# Patient Record
Sex: Male | Born: 1935
Health system: Southern US, Community
[De-identification: ages and names within clinical notes are randomized; demographics above are authoritative.]

## PROBLEM LIST (undated history)

## (undated) DIAGNOSIS — I10 Essential (primary) hypertension: Secondary | ICD-10-CM

## (undated) DIAGNOSIS — I509 Heart failure, unspecified: Secondary | ICD-10-CM

## (undated) DIAGNOSIS — C61 Malignant neoplasm of prostate: Secondary | ICD-10-CM

## (undated) DIAGNOSIS — Z8739 Personal history of other diseases of the musculoskeletal system and connective tissue: Secondary | ICD-10-CM

## (undated) DIAGNOSIS — E785 Hyperlipidemia, unspecified: Secondary | ICD-10-CM

## (undated) DIAGNOSIS — R011 Cardiac murmur, unspecified: Secondary | ICD-10-CM

## (undated) HISTORY — DX: Hyperlipidemia, unspecified: E78.5

## (undated) HISTORY — DX: Essential (primary) hypertension: I10

## (undated) HISTORY — PX: CATARACT EXTRACTION W/ INTRAOCULAR LENS  IMPLANT, BILATERAL: SHX1307

---

## 1998-02-23 ENCOUNTER — Emergency Department (HOSPITAL_COMMUNITY): Admission: EM | Admit: 1998-02-23 | Discharge: 1998-02-23 | Payer: Self-pay | Admitting: Emergency Medicine

## 1998-04-03 ENCOUNTER — Other Ambulatory Visit: Admission: RE | Admit: 1998-04-03 | Discharge: 1998-04-03 | Payer: Self-pay | Admitting: Urology

## 1998-04-09 ENCOUNTER — Encounter: Admission: RE | Admit: 1998-04-09 | Discharge: 1998-07-08 | Payer: Self-pay | Admitting: Radiation Oncology

## 1999-03-11 ENCOUNTER — Encounter: Admission: RE | Admit: 1999-03-11 | Discharge: 1999-03-11 | Payer: Self-pay | Admitting: Internal Medicine

## 2002-02-21 ENCOUNTER — Emergency Department (HOSPITAL_COMMUNITY): Admission: EM | Admit: 2002-02-21 | Discharge: 2002-02-21 | Payer: Self-pay | Admitting: Emergency Medicine

## 2002-03-14 ENCOUNTER — Emergency Department (HOSPITAL_COMMUNITY): Admission: EM | Admit: 2002-03-14 | Discharge: 2002-03-14 | Payer: Self-pay | Admitting: *Deleted

## 2004-07-21 ENCOUNTER — Ambulatory Visit: Payer: Self-pay | Admitting: Nurse Practitioner

## 2004-09-29 ENCOUNTER — Ambulatory Visit: Payer: Self-pay | Admitting: Nurse Practitioner

## 2004-11-13 ENCOUNTER — Ambulatory Visit: Payer: Self-pay | Admitting: Nurse Practitioner

## 2005-04-24 ENCOUNTER — Ambulatory Visit: Payer: Self-pay | Admitting: Nurse Practitioner

## 2005-11-27 ENCOUNTER — Ambulatory Visit: Payer: Self-pay | Admitting: Nurse Practitioner

## 2005-12-31 ENCOUNTER — Ambulatory Visit: Payer: Self-pay | Admitting: Nurse Practitioner

## 2006-05-27 ENCOUNTER — Ambulatory Visit: Payer: Self-pay | Admitting: Nurse Practitioner

## 2006-11-29 ENCOUNTER — Ambulatory Visit: Payer: Self-pay | Admitting: Nurse Practitioner

## 2007-06-22 ENCOUNTER — Ambulatory Visit: Payer: Self-pay | Admitting: Internal Medicine

## 2007-07-15 ENCOUNTER — Ambulatory Visit: Payer: Self-pay | Admitting: Cardiology

## 2007-07-15 LAB — CONVERTED CEMR LAB
BUN: 16 mg/dL (ref 6–23)
Calcium: 9.3 mg/dL (ref 8.4–10.5)
Chloride: 101 meq/L (ref 96–112)
GFR calc non Af Amer: 48 mL/min
Glucose, Bld: 102 mg/dL — ABNORMAL HIGH (ref 70–99)

## 2007-07-20 ENCOUNTER — Ambulatory Visit: Payer: Self-pay | Admitting: Cardiology

## 2007-07-20 LAB — CONVERTED CEMR LAB
CO2: 32 meq/L (ref 19–32)
Calcium: 9.1 mg/dL (ref 8.4–10.5)

## 2007-07-28 ENCOUNTER — Ambulatory Visit: Payer: Self-pay | Admitting: Cardiology

## 2007-07-28 LAB — CONVERTED CEMR LAB
CO2: 31 meq/L (ref 19–32)
Chloride: 103 meq/L (ref 96–112)
Creatinine, Ser: 1.4 mg/dL (ref 0.4–1.5)
GFR calc non Af Amer: 53 mL/min
Potassium: 3.9 meq/L (ref 3.5–5.1)
Sodium: 139 meq/L (ref 135–145)

## 2007-09-02 ENCOUNTER — Ambulatory Visit: Payer: Self-pay

## 2007-09-02 ENCOUNTER — Encounter: Payer: Self-pay | Admitting: Cardiology

## 2007-09-02 ENCOUNTER — Ambulatory Visit: Payer: Self-pay | Admitting: Cardiology

## 2007-09-08 ENCOUNTER — Ambulatory Visit: Payer: Self-pay

## 2007-09-14 ENCOUNTER — Ambulatory Visit: Payer: Self-pay

## 2008-04-02 ENCOUNTER — Emergency Department (HOSPITAL_COMMUNITY): Admission: EM | Admit: 2008-04-02 | Discharge: 2008-04-02 | Payer: Self-pay | Admitting: Emergency Medicine

## 2008-05-07 ENCOUNTER — Ambulatory Visit: Payer: Self-pay | Admitting: Family Medicine

## 2009-03-20 ENCOUNTER — Ambulatory Visit: Payer: Self-pay | Admitting: Internal Medicine

## 2009-03-20 ENCOUNTER — Encounter (INDEPENDENT_AMBULATORY_CARE_PROVIDER_SITE_OTHER): Payer: Self-pay | Admitting: Internal Medicine

## 2009-03-20 LAB — CONVERTED CEMR LAB
AST: 20 units/L (ref 0–37)
Alkaline Phosphatase: 80 units/L (ref 39–117)
CO2: 22 meq/L (ref 19–32)
Calcium: 9.7 mg/dL (ref 8.4–10.5)
Chloride: 103 meq/L (ref 96–112)
Glucose, Bld: 81 mg/dL (ref 70–99)
Potassium: 3.6 meq/L (ref 3.5–5.3)
Sodium: 138 meq/L (ref 135–145)
Total Protein: 7.8 g/dL (ref 6.0–8.3)

## 2009-04-22 ENCOUNTER — Ambulatory Visit: Payer: Self-pay | Admitting: Internal Medicine

## 2009-04-22 ENCOUNTER — Encounter (INDEPENDENT_AMBULATORY_CARE_PROVIDER_SITE_OTHER): Payer: Self-pay | Admitting: Internal Medicine

## 2009-04-22 LAB — CONVERTED CEMR LAB
AST: 20 units/L (ref 0–37)
Alkaline Phosphatase: 76 units/L (ref 39–117)
CO2: 22 meq/L (ref 19–32)
Calcium: 9.6 mg/dL (ref 8.4–10.5)
Chloride: 104 meq/L (ref 96–112)
Creatinine, Ser: 1.83 mg/dL — ABNORMAL HIGH (ref 0.40–1.50)
Glucose, Bld: 81 mg/dL (ref 70–99)
Sodium: 141 meq/L (ref 135–145)
Total Bilirubin: 0.4 mg/dL (ref 0.3–1.2)
Total Protein: 7.9 g/dL (ref 6.0–8.3)

## 2009-05-08 ENCOUNTER — Ambulatory Visit (HOSPITAL_COMMUNITY): Admission: RE | Admit: 2009-05-08 | Discharge: 2009-05-08 | Payer: Self-pay | Admitting: Internal Medicine

## 2009-05-09 ENCOUNTER — Ambulatory Visit: Payer: Self-pay | Admitting: Internal Medicine

## 2009-05-09 ENCOUNTER — Encounter (INDEPENDENT_AMBULATORY_CARE_PROVIDER_SITE_OTHER): Payer: Self-pay | Admitting: Internal Medicine

## 2009-05-09 LAB — CONVERTED CEMR LAB
Bacteria, UA: NONE SEEN
Bilirubin Urine: NEGATIVE
Creatinine, Urine: 200 mg/dL
Hemoglobin, Urine: NEGATIVE
Leukocytes, UA: NEGATIVE
Protein, ur: 100 mg/dL — AB
Urine Glucose: NEGATIVE mg/dL
Urobilinogen, UA: 0.2 (ref 0.0–1.0)
pH: 6 (ref 5.0–8.0)

## 2009-05-17 ENCOUNTER — Ambulatory Visit: Payer: Self-pay | Admitting: Internal Medicine

## 2009-05-17 ENCOUNTER — Encounter (INDEPENDENT_AMBULATORY_CARE_PROVIDER_SITE_OTHER): Payer: Self-pay | Admitting: Internal Medicine

## 2009-05-17 LAB — CONVERTED CEMR LAB
HDL: 45 mg/dL (ref >39)
Triglycerides: 164 mg/dL — ABNORMAL HIGH (ref <150)

## 2009-05-18 ENCOUNTER — Encounter (INDEPENDENT_AMBULATORY_CARE_PROVIDER_SITE_OTHER): Payer: Self-pay | Admitting: Internal Medicine

## 2009-08-01 ENCOUNTER — Encounter (INDEPENDENT_AMBULATORY_CARE_PROVIDER_SITE_OTHER): Payer: Self-pay | Admitting: Family Medicine

## 2009-08-01 ENCOUNTER — Ambulatory Visit: Payer: Self-pay | Admitting: Internal Medicine

## 2009-08-01 LAB — CONVERTED CEMR LAB
ALT: 17 units/L (ref 0–53)
AST: 23 units/L (ref 0–37)
Alkaline Phosphatase: 64 units/L (ref 39–117)
CO2: 23 meq/L (ref 19–32)
Chloride: 99 meq/L (ref 96–112)
Cholesterol: 145 mg/dL (ref 0–200)
Creatinine, Ser: 1.39 mg/dL (ref 0.40–1.50)
Glucose, Bld: 96 mg/dL (ref 70–99)
HDL: 56 mg/dL (ref >39)
LDL Cholesterol: 68 mg/dL (ref 0–99)
Potassium: 4.1 meq/L (ref 3.5–5.3)
Total Bilirubin: 0.6 mg/dL (ref 0.3–1.2)
VLDL: 21 mg/dL (ref 0–40)

## 2009-11-29 ENCOUNTER — Encounter: Admission: RE | Admit: 2009-11-29 | Discharge: 2009-11-29 | Payer: Self-pay | Admitting: Nephrology

## 2010-02-18 ENCOUNTER — Encounter: Admission: RE | Admit: 2010-02-18 | Discharge: 2010-02-18 | Payer: Self-pay | Admitting: Nephrology

## 2010-05-27 IMAGING — CT CT ABDOMEN W/O CM
3 of 4 series · 12 of 36 positions shown, 18 images · IV contrast (READICAT/WATER)
Comparison: Ultrasound on 05/08/2009

CLINICAL DATA: Complex left renal cyst seen on previous
ultrasound.  Renal insufficiency.

CT ABDOMEN WITHOUT CONTRAST
TECHNIQUE: Multidetector CT imaging of the abdomen was performed
following the standard protocol without IV contrast.

[Series 3: unenhanced · axial · 0.72mm/px · z∈[-242,-7]mm · 7 of 63 slices shown, 12 images]
[im 8/63  soft-tissue]
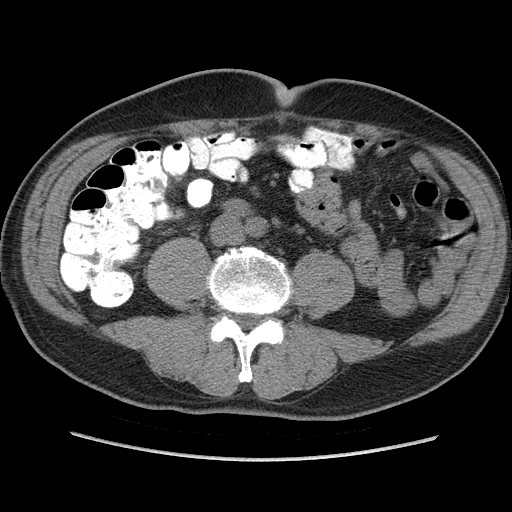
[im 8/63  bone]
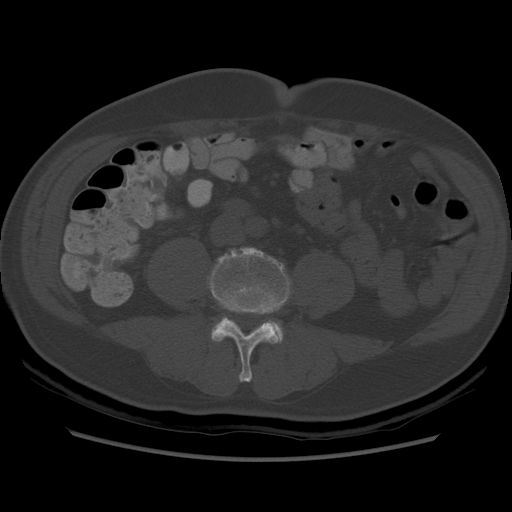
[im 16/63  soft-tissue]
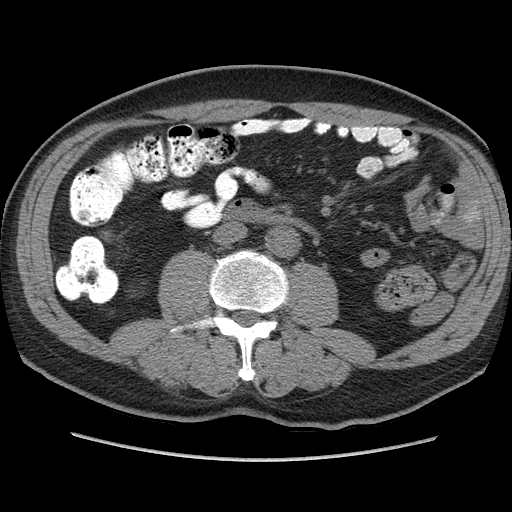
[im 24/63  soft-tissue]
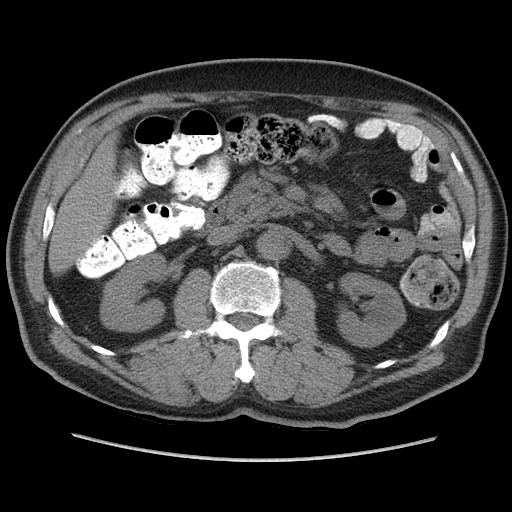
[im 32/63  soft-tissue]
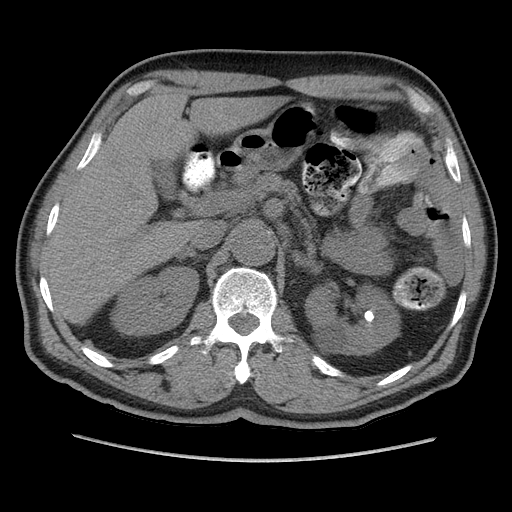
[im 32/63  lung]
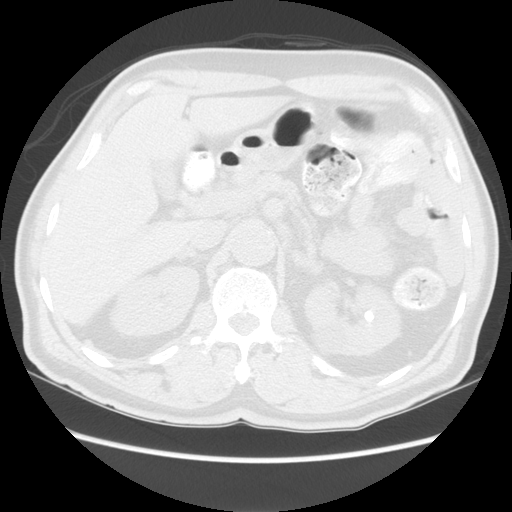
[im 39/63  soft-tissue]
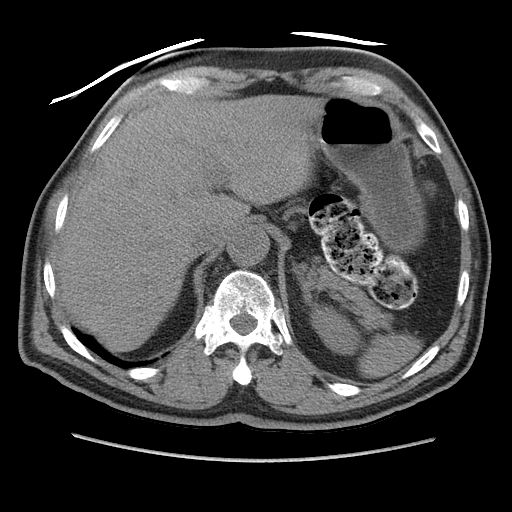
[im 39/63  lung]
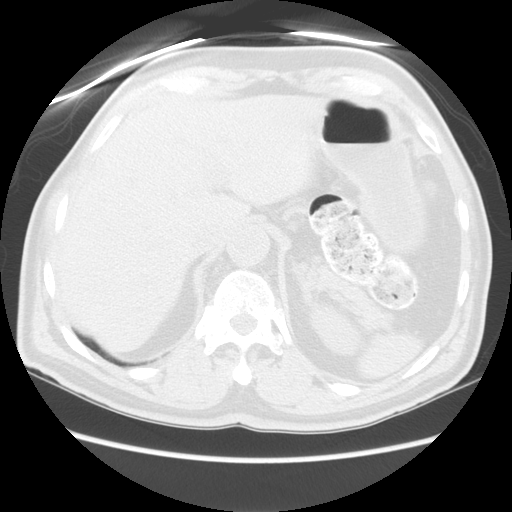
[im 47/63  soft-tissue]
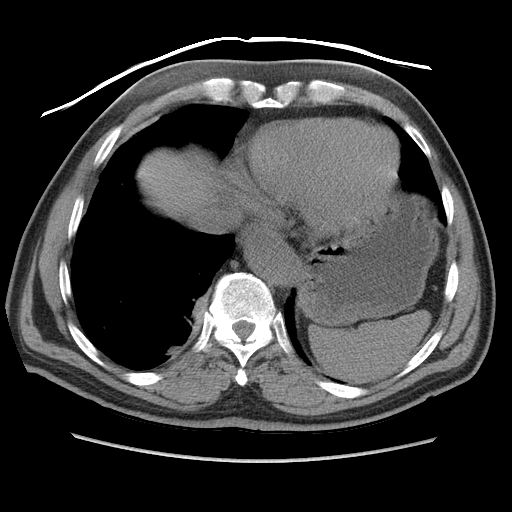
[im 47/63  lung]
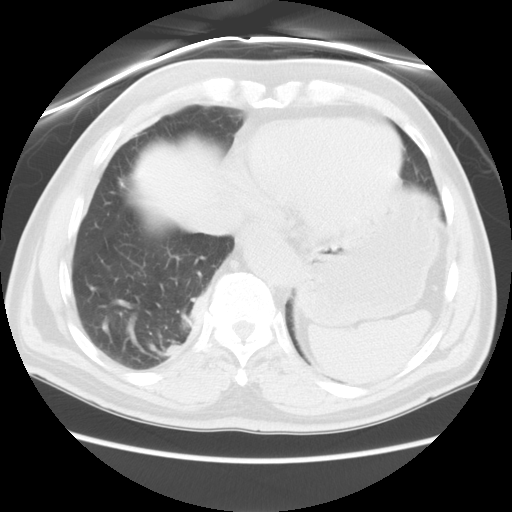
[im 55/63  soft-tissue]
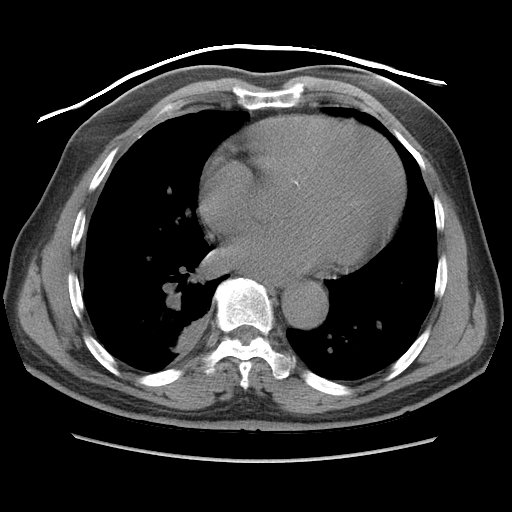
[im 55/63  lung]
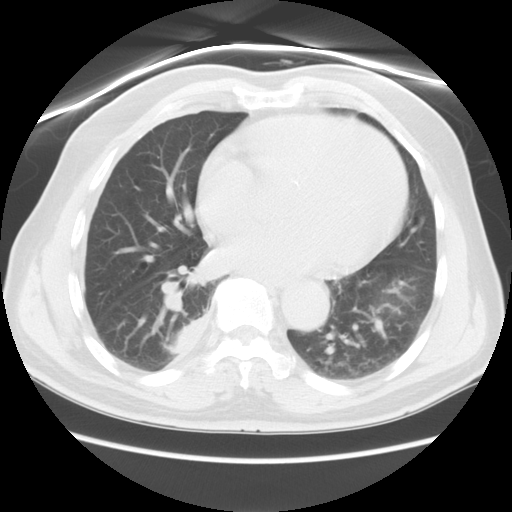

[Series 601: coronal body · coronal · 0.72mm/px · 1 of 120 slices shown, 2 images]
[im 40/120  soft-tissue]
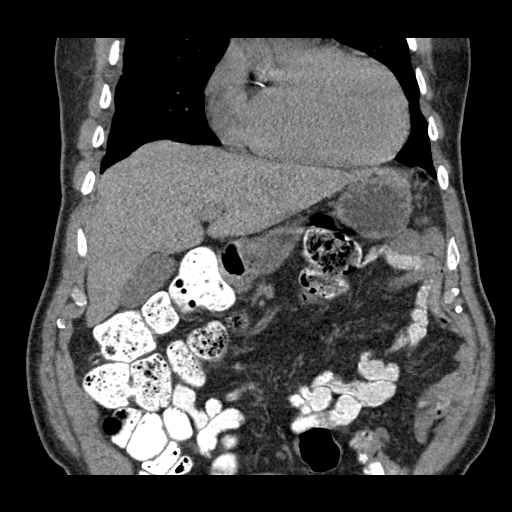
[im 40/120  bone]
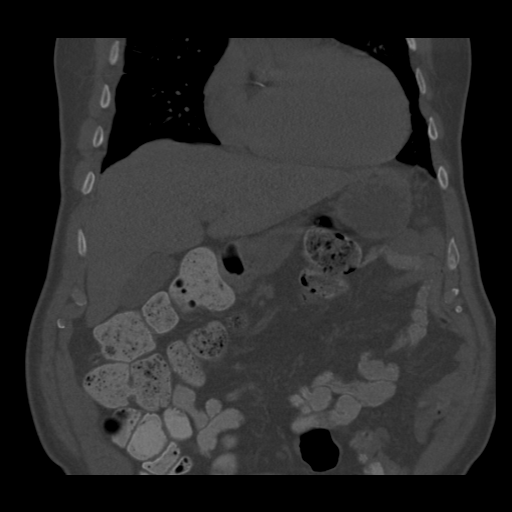

[Series 602: sagittal body · sagittal · 0.72mm/px · 4 of 148 slices shown]
[im 16/148  soft-tissue]
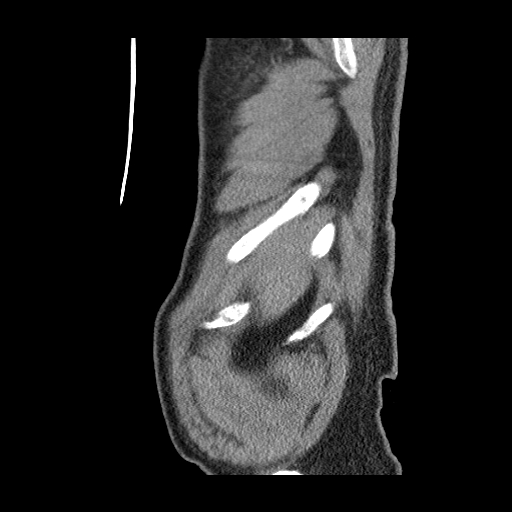
[im 31/148  soft-tissue]
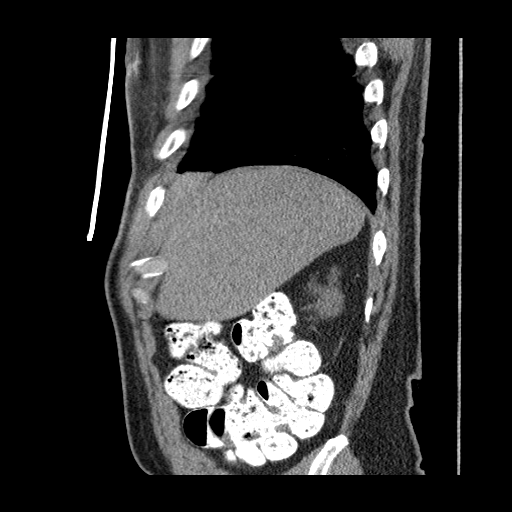
[im 47/148  soft-tissue]
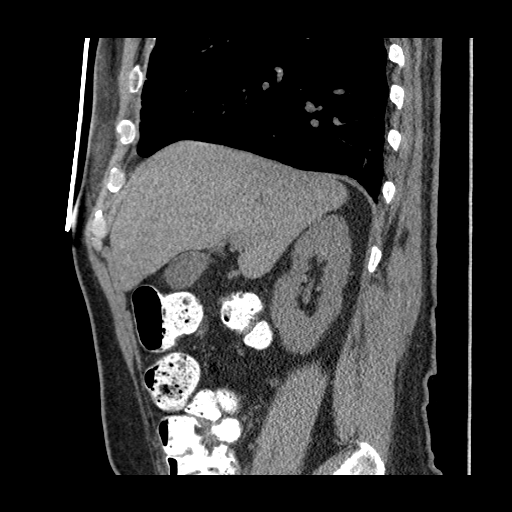
[im 62/148  soft-tissue]
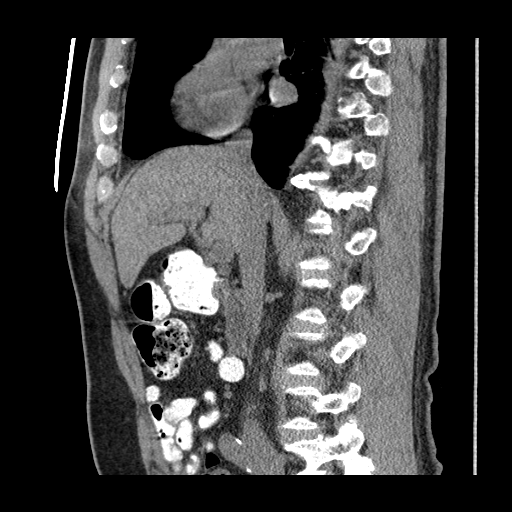

[12 of 36 positions shown; findings below may reference images not displayed]

FINDINGS: At least two cystic lesions are seen in the mid and
lower pole of the left kidney.  These cannot be characterized
without IV contrast.  The cystic lesion in the lower pole measures
approximately 2.8 cm and cystic lesion in the mid pole measures
approximately 3.1 cm.  The lower pole lesion shows no definite
change in size compared to prior ultrasound, however the complex
features seen by ultrasound cannot be evaluated on this noncontrast
study.  The mid pole lesion does measure larger in size than on the
prior ultrasound.

A nonobstructing calculus is seen in the mid pole of the left
kidney measuring 7 mm.  Right kidney is unremarkable appearance on
this noncontrast study.  There is no evidence of hydronephrosis.

The other abdominal parenchymal organs are unremarkable in
appearance on this noncontrast study.  No adenopathy identified.
No evidence of inflammatory process or abnormal fluid collections
within the abdomen.

Images through the lung bases show a rounded pleural based opacity
in the medial right lower lobe which measures 2.2 x 3.6 cm.  The
appearance is suggestive of rounded atelectasis and there is some
associated scarring in this region.  Other possibilities include
pneumonia and neoplasm although these are considered less likely.
IMPRESSION: 1.  At least two cystic lesions in the left kidney, which were
shown to have a complex features on prior ultrasound.  These cannot
be adequately characterized on this noncontrast CT exam, and the
lesion in the mid pole left kidney does measure larger in size than
on prior exam.  Abdomen MRI without and with contrast is
recommended for further evaluation to exclude cystic renal
neoplasm.
2.  7 mm nonobstructing left renal calculus.
3.  Rounded pleural based opacity in the medial right lower lobe.
Features are suggestive of rounded atelectasis, with pneumonia and
neoplasm considered less likely.  Clinical correlation is
recommended, as well short term follow-up by chest CT in 3 months.

## 2010-06-05 ENCOUNTER — Ambulatory Visit: Payer: Self-pay | Admitting: Internal Medicine

## 2010-06-05 LAB — CONVERTED CEMR LAB
Albumin: 4.8 g/dL (ref 3.5–5.2)
Alkaline Phosphatase: 69 units/L (ref 39–117)
Calcium: 9.8 mg/dL (ref 8.4–10.5)
Chloride: 102 meq/L (ref 96–112)
Sodium: 139 meq/L (ref 135–145)
Total Bilirubin: 0.3 mg/dL (ref 0.3–1.2)
Total Protein: 8.3 g/dL (ref 6.0–8.3)

## 2010-09-15 ENCOUNTER — Encounter (INDEPENDENT_AMBULATORY_CARE_PROVIDER_SITE_OTHER): Payer: Self-pay | Admitting: *Deleted

## 2010-09-15 LAB — CONVERTED CEMR LAB
AST: 18 units/L (ref 0–37)
Alkaline Phosphatase: 70 units/L (ref 39–117)
BUN: 13 mg/dL (ref 6–23)
Basophils Relative: 2 % — ABNORMAL HIGH (ref 0–1)
Calcium: 9.5 mg/dL (ref 8.4–10.5)
Chloride: 101 meq/L (ref 96–112)
Creatinine, Ser: 1.39 mg/dL (ref 0.40–1.50)
Eosinophils Absolute: 0.1 10*3/uL (ref 0.0–0.7)
Eosinophils Relative: 2 % (ref 0–5)
LDL Cholesterol: 82 mg/dL (ref 0–99)
MCHC: 32.8 g/dL (ref 30.0–36.0)
Monocytes Absolute: 0.4 10*3/uL (ref 0.1–1.0)
Monocytes Relative: 9 % (ref 3–12)
Neutro Abs: 2.5 10*3/uL (ref 1.7–7.7)
PSA: 1.07 ng/mL (ref <=4.00)
Platelets: 236 10*3/uL (ref 150–400)
Potassium: 3.9 meq/L (ref 3.5–5.3)
RDW: 12.2 % (ref 11.5–15.5)
Sodium: 138 meq/L (ref 135–145)
Total Bilirubin: 0.5 mg/dL (ref 0.3–1.2)
Total CHOL/HDL Ratio: 3.2
Triglycerides: 141 mg/dL (ref <150)
VLDL: 28 mg/dL (ref 0–40)

## 2010-11-16 ENCOUNTER — Encounter: Payer: Self-pay | Admitting: Nephrology

## 2010-11-17 ENCOUNTER — Encounter: Payer: Self-pay | Admitting: Internal Medicine

## 2010-12-09 ENCOUNTER — Other Ambulatory Visit: Payer: Self-pay | Admitting: Nephrology

## 2010-12-09 DIAGNOSIS — N281 Cyst of kidney, acquired: Secondary | ICD-10-CM

## 2010-12-15 ENCOUNTER — Ambulatory Visit
Admission: RE | Admit: 2010-12-15 | Discharge: 2010-12-15 | Disposition: A | Discharge status: Home / Self Care | Payer: Medicare Other | Source: Ambulatory Visit | Attending: Nephrology | Admitting: Nephrology

## 2010-12-15 DIAGNOSIS — N281 Cyst of kidney, acquired: Secondary | ICD-10-CM

## 2010-12-15 MED ORDER — GADOBENATE DIMEGLUMINE 529 MG/ML IV SOLN
20.0000 mL | Freq: Once | INTRAVENOUS | Status: AC | PRN
  Administered 2010-12-15: 20 mL via INTRAVENOUS

## 2011-03-10 NOTE — Assessment & Plan Note (Signed)
[facility] OFFICE NOTE   NAME:[name] [name], [name] [name]                   MRN:          [id number]  DATE:07/28/2007                            DOB:          [birth year]    PRIMARY:  Dr. Ellsworth Lennox.   REASON FOR PRESENTATION:  Evaluate patient with difficult to control  hypertension.   HISTORY OF PRESENT ILLNESS:  The patient is 77 years old.  He presents  for followup.  At the last appointment he stopped his Prazosin  completely to see if this helped with his ringing in his ears, which was  a chief complaint.  I switched him to lisinopril.  He has not had any  problem taking his medication.  He has been noted to be pretty  hypokalemic and we have been supplementing this.  The etiology of that  is not entirely clear.  He says the ringing in his ears is now only  intermittent and light.  He is not bothered by it.  He has had no  presyncope or syncope.  He has had no chest pain or shortness of breath.  He does a little walking but wants to get into an exercise regimen.   PAST MEDICAL HISTORY:  1. Hypertension x20 years.  2. Prostate cancer treated with radiation.   ALLERGIES:  None.   MEDICATIONS:  1. Benazepril 10 mg daily.  2. Nifedipine 90 mg daily.  3. Hydrochlorothiazide 25 mg daily.  4. Klor-Con 40 mEq daily.   REVIEW OF SYSTEMS:  As stated in the HPI and otherwise negative for  other systems.   PHYSICAL EXAMINATION:  The patient is in no distress.  Blood pressure  136/84, heart rate 59 and regular, weight 224 pounds, body mass index  28.  HEENT:  Eyes unremarkable, pupils equal, round and react to light, fundi  not visualized, oral mucosa unremarkable.  NECK:  No jugular venous distention at 45 degrees, carotid upstroke  brisk and symmetric, no bruits, no thyromegaly.  LYMPHATICS:  No adenopathy.  LUNGS:  Clear to auscultation bilaterally.  BACK:  No costovertebral angle tenderness.  CHEST:   Unremarkable.  HEART:  PMI not displaced or sustained, S1 and S2 within normal limits,  no S3, no S4, a 2/6 apical systolic murmur radiating at the aortic  outflow tract, brief, no diastolic murmurs.  ABDOMEN:  Flat, positive bowel sounds normal in frequency and pitch, no  bruits, no rebound, no guarding, no midline pulses, no masses, no  hepatomegaly, no splenomegaly.  SKIN:  No rashes, no nodules.  EXTREMITIES:  Upper pulses 2+, 2+ dorsalis pedis, right femoral bruit,  no cyanosis, no clubbing.  NEURO:  Oriented to person, place and time, cranial nerves II through  XII grossly intact, motor grossly intact throughout.   ASSESSMENT AND PLAN:  1. Hypertension.  The patient's blood pressure seems to be well      controlled.  He will get another BMET today.  I think this will be      his regimen, though I will judge his blood pressure response with      exercise as  well.  2. Systolic murmur.  This is slight and I doubt related to any      significant valvular pathology.  Will follow this clinically.  3. Followup.  I will see the patient again back in a couple of weeks      for his exercise treadmill test.  This will allow me to risk      stratify him.  Marveen Reeks his blood pressure and give him a prescription      for exercise since he wants to start walking routinely.     Minus Breeding, MD, [facility]  Electronically Signed    JH/MedQ  DD: 07/28/2007  DT: 07/28/2007  Job #: [id number]   cc:   Barton Fanny, M.D.

## 2011-03-10 NOTE — Assessment & Plan Note (Signed)
[facility] OFFICE NOTE   NAME:[name] [name], [name] [name] [name]                   MRN:          [id number]  DATE:07/15/2007                            DOB:          12/17/1929    REASON FOR CONSULTATION:  Evaluate patient with hypertension and ringing  in his ears.   HISTORY OF PRESENT ILLNESS:  Patient is a very pleasant 77 year old  gentleman without prior cardiac history.  He says he has had  hypertension for years.  In the last 2 months he has been bothered by  ringing in his ears.  This has been constant.  It mostly bothers him at  night.  He has not had any dizziness.  He has not noticed any  palpitations.  He has had no presyncope or syncope.  He is not aware  that his blood pressure has been particularly high.  He was told to stop  his prazosin as this might contribute.  However, he is still taking this  sporadically.  He is worried about his blood pressure going up.  He is  an active gentleman and pushes a lawnmower.  With this he denies any  chest discomfort, neck or arm discomfort.  He has had no shortness of  breath.  Denies any PND or orthopnea.   PAST MEDICAL HISTORY:  1. Hypertension x20 years.  2. Prostate cancer treated with radiation therapy.  3. The patient has no history of diabetes.  4. He has been told his cholesterol has been slightly high in the      past.   PAST SURGICAL HISTORY:  None.   ALLERGIES:  NONE.   MEDICATIONS:  1. Nifedipine 90 mg daily.  2. Hydrochlorothiazide 25 mg daily.  3. Prazosin 2 mg daily.   SOCIAL HISTORY:  The patient has been married for 49 years.  He is  retired from [employer].  He has 5 children and about 10  grandchildren.  Quit smoking 10 years ago after smoking 1 pack per day  for 8 years.  He does not drink alcohol.   FAMILY HISTORY:  Noncontributory for early coronary artery disease  though his father died in his 75s of a myocardial infarction.  He does  have a brother who had heart something.   REVIEW OF SYSTEMS:  As stated in the HPI and positive for nocturia.  Negative for all other systems.   PHYSICAL EXAMINATION:  The patient is in no acute distress.  Blood  pressure 142/80, heart rate 63 and regular, body mass index 31, weight  222 pounds.  HEENT:  Eyelids unremarkable, pupils equally round and reactive to  light, fundi not visualized, oral mucosa unremarkable.  NECK:  No jugular venous distention, wave form within normal limits,  carotid upstroke brisk and symmetrical, no bruits, no thyromegaly.  LYMPHATICS:  No adenopathy.  LUNGS:  Clear to auscultation bilaterally.  BACK:  No costovertebral angle tenderness.  CHEST:  Unremarkable.  HEART:  PMI not displaced or sustained, S1 and S2 within normal limits,  no S3, no S4.  A 2/6 apical systolic murmur, brief, radiating out the  aortic outflow tract, only after the ectopic beats, no diastolic  murmurs.  ABDOMEN:  Flat, positive bowel sounds normal in frequency and pitch, no  bruits, no rebound, no guarding, no midline pulsatile mass, no  hepatomegaly, splenomegaly.  SKIN:  No rashes, no nodules.  EXTREMITIES:  With 2+ pulses throughout, no edema, no cyanosis, no  clubbing.  NEURO:  Oriented to person, place, and time; cranial nerves II-XII  grossly intact, motor grossly intact throughout.   EKG sinus rhythm, rate 63, premature ectopic complexes, left bundle  branch block, leftward axis.   ASSESSMENT/PLAN:  1. Hypertension.  Blood pressure is slightly elevated.  It is true      that his prazosin could be contributing to the ringing in his ears.      I am going to have him stop his prazosin all together.  I am going      to start lisinopril 10 mg daily.  I am going to check a BMET today.      He will get a BMET in 2 weeks.  I will have him come back for this      and a blood pressure check as well.  2. Systolic murmur.  The patient has a very slight systolic murmur.      At  this point I do not think an echocardiogram is warranted as he      is completely asymptomatic.  3. Conduction disturbance.  Patient has left bundle branch block but      again is completely asymptomatic.  No further evaluation is      warranted.  This may well be related to his hypertension.  4. Followup.  I will see the patient back in a couple of weeks.     Minus Breeding, MD, [facility]  Electronically Signed    JH/MedQ  DD: 07/15/2007  DT: 07/15/2007  Job #: [id number]   cc:   Barton Fanny, M.D.

## 2011-03-10 NOTE — Procedures (Signed)
 [facility]ALTH[facility]                              EXERCISE TREADMILL   NAME:[name] [name], [name] [name]                   MRN:          [id number]  DATE:09/02/2007                            DOB:          [birth year]    PRIMARY CARE PHYSICIAN:  Barton Fanny, MD.   PROCEDURE:  Exercise treadmill test.   INDICATION:  Evaluate patient with chest discomfort and difficulty  controlling hypertension.   PROCEDURAL REPORT:  The patient was exercised using a standard Bruce  protocol.  He only exercised for three minutes.  This completed stage 1.  This was 4.8 mets.  His heart rate did achieve target of 137 beats per  minute, which was 94% of predicted.  He had an accelerated blood  pressure response with his heart rate going up from 128/89 to 196/110.  He had appropriate dyspnea from his heart rate, and no chest discomfort.  He had significant ectopy.  He had this prior to the procedure.  However, he developed bigeminy during the procedure.  At recovery, he  had multiple 4 and 5 beat runs.  He was not particularly symptomatic  with this.  There was no presyncope or syncope.  He did not have a  normal heart rate recovery.  Because of the ectopy, it was somewhat  difficult to interpret the ST changes.  There were some borderline,  inferior, horizontal changes.  Of note, he has an intraventricular  conduction delay.   CONCLUSION:  Uninterpretable exercise treadmill test with frequent  ventricular ectopy with frequent runs of nonsustained ventricular  tachycardia.   PLAN:  I did send the patient for an echocardiogram.  This demonstrates  his EF to be 55 to 65%.  There was hypokinesis at the basal mid  posterior wall.  He had some mildly increased left ventricular wall  thickness.  The patient will be further evaluated with a stress  perfusion study.  He will continue his medications as listed.     Minus Breeding, MD, [facility]  Electronically Signed    JH/MedQ  DD:  09/02/2007  DT: 09/03/2007  Job #: [id number]   cc:   Barton Fanny, M.D.

## 2011-07-23 LAB — RAPID STREP SCREEN (MED CTR MEBANE ONLY): Streptococcus, Group A Screen (Direct): NEGATIVE

## 2012-10-27 ENCOUNTER — Encounter: Payer: Self-pay | Admitting: Internal Medicine

## 2012-10-27 ENCOUNTER — Ambulatory Visit (INDEPENDENT_AMBULATORY_CARE_PROVIDER_SITE_OTHER): Payer: Medicare Other | Admitting: Internal Medicine

## 2012-10-27 ENCOUNTER — Ambulatory Visit: Payer: Medicare Other | Admitting: Internal Medicine

## 2012-10-27 VITALS — BP 151/82 | HR 70 | Temp 97.2°F | Ht 73.0 in | Wt 218.2 lb

## 2012-10-27 DIAGNOSIS — I10 Essential (primary) hypertension: Secondary | ICD-10-CM

## 2012-10-27 DIAGNOSIS — J309 Allergic rhinitis, unspecified: Secondary | ICD-10-CM

## 2012-10-27 DIAGNOSIS — Z8546 Personal history of malignant neoplasm of prostate: Secondary | ICD-10-CM

## 2012-10-27 DIAGNOSIS — E785 Hyperlipidemia, unspecified: Secondary | ICD-10-CM

## 2012-10-27 DIAGNOSIS — Z0000 Encounter for general adult medical examination without abnormal findings: Secondary | ICD-10-CM

## 2012-10-27 LAB — CBC
HCT: 42.8 % (ref 39.0–52.0)
Hemoglobin: 14.8 g/dL (ref 13.0–17.0)
MCHC: 34.6 g/dL (ref 30.0–36.0)
MCV: 89.4 fL (ref 78.0–100.0)
Platelets: 266 10*3/uL (ref 150–400)
RDW: 12.3 % (ref 11.5–15.5)
WBC: 5.1 10*3/uL (ref 4.0–10.5)

## 2012-10-27 LAB — LIPID PANEL
LDL Cholesterol: 77 mg/dL (ref 0–99)
Total CHOL/HDL Ratio: 2.8 Ratio
VLDL: 24 mg/dL (ref 0–40)

## 2012-10-27 LAB — PSA: PSA: 1.28 ng/mL (ref <=4.00)

## 2012-10-27 MED ORDER — SIMVASTATIN 10 MG PO TABS: 10.0000 mg | ORAL_TABLET | Freq: Every morning | ORAL | Status: DC

## 2012-10-27 MED ORDER — BENAZEPRIL HCL 40 MG PO TABS: 40.0000 mg | ORAL_TABLET | Freq: Every day | ORAL | Status: DC

## 2012-10-27 MED ORDER — NIFEDIPINE ER OSMOTIC RELEASE 90 MG PO TB24: 90.0000 mg | ORAL_TABLET | Freq: Every day | ORAL | Status: DC

## 2012-10-27 MED ORDER — CETIRIZINE HCL 10 MG PO CAPS: 1.0000 | ORAL_CAPSULE | ORAL | Status: DC | PRN

## 2012-10-27 NOTE — Assessment & Plan Note (Signed)
Stable.No symptoms at today's visit. He was given the prescription for as needed cetirizine.

## 2012-10-27 NOTE — Assessment & Plan Note (Signed)
PSA is the mainstay of surveillance testing in men who have undergone therapy for localized prostate cancer. Check PSA today.

## 2012-10-27 NOTE — Assessment & Plan Note (Signed)
Check lipid panel and CMET. Continue Zocor at current dose.

## 2012-10-27 NOTE — Assessment & Plan Note (Signed)
Blood pressure stable for his age group at 151/82. Continue current regimen for now. Check BMET today.

## 2012-10-27 NOTE — Assessment & Plan Note (Signed)
He reports getting flu shot about 3 months ago.

## 2012-10-27 NOTE — Progress Notes (Signed)
Subjective:   Patient ID: [name] [name] male   DOB: Nov 26, 1929 79 y.o.   MRN: [id number]  HPI: 79 year old gentleman with past medical history significant for hypertension, hyperlipidemia, history of prostrate cancer status post radiation about 13 years ago is a new patient to the clinic. He used to follow up with [facility] before it closed.  He is here to establish care with our clinic.   He denies any complaints today- reports feeling healthy. He is requesting medication refills and routine blood draw. He is also requesting to get his PSA checked given his history of prostrate cancer.      Past Medical History  Diagnosis Date  . Hyperlipidemia   . Hypertension   . H/O prostate cancer     about 1 3 years ago, s/p radiation   Family History  Problem Relation Age of Onset  . Hypertension Father   . Hypertension Sister   . Hypertension Brother    History   Social History  . Marital Status: Married    Spouse Name: N/A    Number of Children: N/A  . Years of Education: N/A   Occupational History  . Not on file.   Social History Main Topics  . Smoking status: Former Research scientist (life sciences)  . Smokeless tobacco: Not on file  . Alcohol Use: Not on file  . Drug Use: Not on file  . Sexually Active: Not on file   Other Topics Concern  . Not on file   Social History Narrative  . No narrative on file   Review of Systems: General: Denies fever, chills, diaphoresis, appetite change and fatigue. HEENT: Denies photophobia, eye pain, redness, hearing loss, ear pain, congestion, sore throat, rhinorrhea, sneezing, mouth sores, trouble swallowing, neck pain, neck stiffness and tinnitus. Respiratory: Denies SOB, DOE, cough, chest tightness, and wheezing. Cardiovascular: Denies to chest pain, palpitations and leg swelling. Gastrointestinal: Denies nausea, vomiting, abdominal pain, diarrhea, constipation, blood in stool and abdominal distention. Genitourinary: Denies dysuria, urgency,  frequency, hematuria, flank pain and difficulty urinating. Musculoskeletal: Denies myalgias, back pain, joint swelling, arthralgias and gait problem.  Skin: Denies pallor, rash and wound. Neurological: Denies dizziness, seizures, syncope, weakness, light-headedness, numbness and headaches. Hematological: Denies adenopathy, easy bruising, personal or family bleeding history. Psychiatric/Behavioral: Denies suicidal ideation, mood changes, confusion, nervousness, sleep disturbance and agitation.    Current Outpatient Medications: Current Outpatient Prescriptions  Medication Sig Dispense Refill  . benazepril (LOTENSIN) 40 MG tablet Take 1 tablet (40 mg total) by mouth daily.  60 tablet  3  . Cetirizine HCl 10 MG CAPS Take 1 capsule (10 mg total) by mouth as needed.  30 capsule  1  . NIFEdipine (PROCARDIA XL/ADALAT-CC) 90 MG 24 hr tablet Take 1 tablet (90 mg total) by mouth daily.  30 tablet  3  . simvastatin (ZOCOR) 10 MG tablet Take 1 tablet (10 mg total) by mouth every morning.  30 tablet  3    Allergies: Allergies no known allergies    Objective:   Physical Exam: Filed Vitals:   10/27/12 1054  BP: 151/82  Pulse: 70  Temp: 97.2 F (36.2 C)    General: Vital signs reviewed and noted. Well-developed, well-nourished, in no acute distress; alert, appropriate and cooperative throughout examination. Head: Normocephalic, atraumatic Lungs: Normal respiratory effort. Clear to auscultation BL without crackles or wheezes. Heart: RRR. S1 and S2 normal without gallop, murmur, or rubs. Abdomen:BS normoactive. Soft, Nondistended, non-tender.  No masses or organomegaly. Extremities: No pretibial edema.  Assessment & Plan:

## 2012-10-27 NOTE — Patient Instructions (Addendum)
General Instructions: Please schedule a follow up appointment in 1-2 months . Please bring your medication bottles with your next appointment. Please take your medicines as prescribed. I will call you with your lab results if anything will be abnormal.     Treatment Goals:  Goals (1 Years of Data) as of 10/27/2012          As of Today     Blood Pressure    . Blood Pressure < 150/90  151/82         Note created  10/27/2012 11:14 AM by Pedro Earls, MD    Given his geriatric age group, would target his SBP- 140-150 mm of Hg.            Progress Toward Treatment Goals: First visit with the clinic    Self Care Goals & Plans:       Care Management & Community Referrals:

## 2012-10-28 LAB — COMPLETE METABOLIC PANEL WITH GFR
Albumin: 4.8 g/dL (ref 3.5–5.2)
Alkaline Phosphatase: 68 U/L (ref 39–117)
CO2: 28 mEq/L (ref 19–32)
Calcium: 10.2 mg/dL (ref 8.4–10.5)
Chloride: 99 mEq/L (ref 96–112)
Glucose, Bld: 103 mg/dL — ABNORMAL HIGH (ref 70–99)
Potassium: 3.7 mEq/L (ref 3.5–5.3)
Sodium: 138 mEq/L (ref 135–145)

## 2012-12-13 ENCOUNTER — Encounter: Payer: Medicare Other | Admitting: Internal Medicine

## 2013-01-03 ENCOUNTER — Ambulatory Visit (INDEPENDENT_AMBULATORY_CARE_PROVIDER_SITE_OTHER): Payer: Medicare Other | Admitting: Internal Medicine

## 2013-01-03 ENCOUNTER — Encounter: Payer: Self-pay | Admitting: Internal Medicine

## 2013-01-03 VITALS — BP 137/78 | HR 69 | Temp 97.5°F | Ht 73.0 in | Wt 216.4 lb

## 2013-01-03 DIAGNOSIS — Z23 Encounter for immunization: Secondary | ICD-10-CM

## 2013-01-03 DIAGNOSIS — Z0000 Encounter for general adult medical examination without abnormal findings: Secondary | ICD-10-CM

## 2013-01-03 DIAGNOSIS — I10 Essential (primary) hypertension: Secondary | ICD-10-CM

## 2013-01-03 DIAGNOSIS — E785 Hyperlipidemia, unspecified: Secondary | ICD-10-CM

## 2013-01-03 DIAGNOSIS — Z8546 Personal history of malignant neoplasm of prostate: Secondary | ICD-10-CM

## 2013-01-03 NOTE — Patient Instructions (Signed)
1. You have done great job in taking all your medications. I appreciate it very much. Please continue doing that. 2. Please take all medications as prescribed.  3. If you have worsening of your symptoms or new symptoms arise, please call the clinic [phone]), or go to the ER immediately if symptoms are severe.  Hypertension As your heart beats, it forces blood through your arteries. This force is your blood pressure. If the pressure is too high, it is called hypertension (HTN) or high blood pressure. HTN is dangerous because you may have it and not know it. High blood pressure may mean that your heart has to work harder to pump blood. Your arteries may be narrow or stiff. The extra work puts you at risk for heart disease, stroke, and other problems.  Blood pressure consists of two numbers, a higher number over a lower, 110/72, for example. It is stated as "110 over 72." The ideal is below 120 for the top number (systolic) and under 80 for the bottom (diastolic). Write down your blood pressure today. You should pay close attention to your blood pressure if you have certain conditions such as:  Heart failure.  Prior heart attack.  Diabetes  Chronic kidney disease.  Prior stroke.  Multiple risk factors for heart disease. To see if you have HTN, your blood pressure should be measured while you are seated with your arm held at the level of the heart. It should be measured at least twice. A one-time elevated blood pressure reading (especially in the Emergency Department) does not mean that you need treatment. There may be conditions in which the blood pressure is different between your right and left arms. It is important to see your caregiver soon for a recheck. Most people have essential hypertension which means that there is not a specific cause. This type of high blood pressure may be lowered by changing lifestyle factors such as:  Stress.  Smoking.  Lack of exercise.  Excessive  weight.  Drug/tobacco/alcohol use.  Eating less salt. Most people do not have symptoms from high blood pressure until it has caused damage to the body. Effective treatment can often prevent, delay or reduce that damage. TREATMENT  When a cause has been identified, treatment for high blood pressure is directed at the cause. There are a large number of medications to treat HTN. These fall into several categories, and your caregiver will help you select the medicines that are best for you. Medications may have side effects. You should review side effects with your caregiver. If your blood pressure stays high after you have made lifestyle changes or started on medicines,   Your medication(s) may need to be changed.  Other problems may need to be addressed.  Be certain you understand your prescriptions, and know how and when to take your medicine.  Be sure to follow up with your caregiver within the time frame advised (usually within two weeks) to have your blood pressure rechecked and to review your medications.  If you are taking more than one medicine to lower your blood pressure, make sure you know how and at what times they should be taken. Taking two medicines at the same time can result in blood pressure that is too low. SEEK IMMEDIATE MEDICAL CARE IF:  You develop a severe headache, blurred or changing vision, or confusion.  You have unusual weakness or numbness, or a faint feeling.  You have severe chest or abdominal pain, vomiting, or breathing problems. MAKE SURE YOU:  Understand these instructions.  Will watch your condition.  Will get help right away if you are not doing well or get worse. Document Released: 10/12/2005 Document Revised: 01/04/2012 Document Reviewed: 06/01/2008 [facility] Patient Information 2013 [place].

## 2013-01-03 NOTE — Assessment & Plan Note (Signed)
No new issues. PSA was 1.28. we continue to monitor.

## 2013-01-03 NOTE — Assessment & Plan Note (Signed)
-   Flu shot is up-to-date - Patient reports that he possibly had pneumococcal vaccination 4 years ago. We will try to find his record. Will not give pneumococcal vaccination today.  - USPSTF recommends against routine screening for colorectal cancer in adults ages 10 to 55 years. There may be considerations that support colorectal cancer screening in an individual patient. Patient has never noticed blood in his stool. Will not do colonoscopy.

## 2013-01-03 NOTE — Assessment & Plan Note (Signed)
BP Readings from Last 3 Encounters:  01/03/13 137/78  10/27/12 151/82    Lab Results  Component Value Date   NA 138 10/27/2012   K 3.7 10/27/2012   CREATININE 1.31 10/27/2012    Assessment:  Blood pressure control: controlled  Progress toward BP goal:  at goal  Comments:  Plan:  Medications:  continue current medications  Educational resources provided: brochure  Self management tools provided:    Other plans: Blood pressure is well controlled. Today blood pressure is 137/78. Will continue current regimen.

## 2013-01-03 NOTE — Assessment & Plan Note (Signed)
Recent LDL was 77 on 10/27/12. Patient is currently taking Zocor 10 mg daily. He did not notice any side effects, such as muscle pain. AST and ALT were normal on 10/27/12. We'll continue current regimen.

## 2013-01-03 NOTE — Progress Notes (Signed)
Patient ID: [name] [name], male   DOB: Feb 25, 1930, 79 y.o.   MRN: [id number]  Subjective:   Patient ID: [name] [name] male   DOB: 07/12/1930 79 y.o.   MRN: [id number]  CC:   Two month follow up visit for HTN.       HPI:  [name] is a 79 y.o. man with past medical history as outlined below, who presents for a followup visit today   1. HTN: His currently taking Lotensin 40 mg daily and nifedipine 90 mg daily. Blood pressure is 137/70. Patient does not have chest pain, shortness of breath, or leg edema.  2. HLD: Patient's recent LDL was 77 on 10/27/12. He is currently taking Zocor 10 mg daily. His previous AST and ALT on 10/27/12 were normal. He did not noticed any side effects, such as muscle pain.  3. Hx of prostate cancer: He has history of prostrate cancer status post radiation about 13 years ago. Currently he is asymptomatic. His PSA was 1.28 on 10/27/12.     03/20/2009 20:17 09/15/2010 22:35 10/27/2012 11:21  PSA  2.52 1.07 1.28   ROS: Denies fever, chills, fatigue, headaches,  cough, chest pain, SOB,  abdominal pain,diarrhea, constipation, dysuria, urgency, frequency, hematuria, joint pain or leg swelling.   Past Medical History  Diagnosis Date  . Hyperlipidemia   . Hypertension   . H/O prostate cancer     about 1 3 years ago, s/p radiation   Current Outpatient Prescriptions  Medication Sig Dispense Refill  . benazepril (LOTENSIN) 40 MG tablet Take 1 tablet (40 mg total) by mouth daily.  60 tablet  3  . NIFEdipine (PROCARDIA XL/ADALAT-CC) 90 MG 24 hr tablet Take 1 tablet (90 mg total) by mouth daily.  30 tablet  3  . simvastatin (ZOCOR) 10 MG tablet Take 1 tablet (10 mg total) by mouth every morning.  30 tablet  3   No current facility-administered medications for this visit.   Family History  Problem Relation Age of Onset  . Hypertension Father   . Hypertension Sister   . Hypertension Brother    History   Social History  . Marital Status: Married     Spouse Name: N/A    Number of Children: N/A  . Years of Education: N/A   Social History Main Topics  . Smoking status: Former Research scientist (life sciences)  . Smokeless tobacco: None  . Alcohol Use: None  . Drug Use: None  . Sexually Active: None   Other Topics Concern  . None   Social History Narrative  . None    Review of Systems: as per HPI.  Objective:  Physical Exam: Filed Vitals:   01/03/13 1331  BP: 137/78  Pulse: 69  Temp: 97.5 F (36.4 C)  Height: 6\' 1"  (1.854 m)  Weight: 216 lb 6.4 oz (98.158 kg)  SpO2: 99%   Constitutional: Vital signs reviewed.  Patient is a well-developed and well-nourished, in no acute distress and cooperative with exam.   HEENT:  Head: Normocephalic and atraumatic Ear: TM normal bilaterally Mouth: no erythema or exudates, MMM Eyes: PERRL, EOMI, conjunctivae normal, No scleral icterus.  Neck: Supple, Trachea midline normal ROM, No JVD, mass, thyromegaly, or carotid bruit present. No lymph node enlargement. Cardiovascular: RRR, S1 normal, S2 normal, no MRG, pulses symmetric and intact bilaterally Pulmonary/Chest: CTAB, no wheezes, rales, or rhonchi Abdominal: Soft. Non-tender, non-distended, bowel sounds are normal, no masses, organomegaly, or guarding present.  GU: no CVA tenderness Musculoskeletal: No joint deformities, erythema,  or stiffness, ROM full and non-tender Hematology: no cervical, inginal, or axillary adenopathy.  Neurological: A&O x3, Strength is normal and symmetric bilaterally, cranial nerve II-XII are grossly intact, no focal motor deficit, sensory intact to light touch bilaterally.  Skin: Warm, dry and intact. No rash, cyanosis, or clubbing.  Psychiatric: Normal mood and affect. speech and behavior is normal. Judgment and thought content normal. Cognition and memory are normal.   Assessment & Plan:

## 2013-03-22 ENCOUNTER — Other Ambulatory Visit: Payer: Self-pay | Admitting: Internal Medicine

## 2013-04-04 ENCOUNTER — Encounter: Payer: Self-pay | Admitting: Internal Medicine

## 2013-04-04 ENCOUNTER — Ambulatory Visit (HOSPITAL_BASED_OUTPATIENT_CLINIC_OR_DEPARTMENT_OTHER): Payer: Medicare Other | Admitting: Internal Medicine

## 2013-04-04 VITALS — BP 151/79 | HR 62 | Temp 97.0°F | Ht 73.0 in | Wt 209.6 lb

## 2013-04-04 DIAGNOSIS — E785 Hyperlipidemia, unspecified: Secondary | ICD-10-CM

## 2013-04-04 DIAGNOSIS — I10 Essential (primary) hypertension: Secondary | ICD-10-CM

## 2013-04-04 DIAGNOSIS — Z0000 Encounter for general adult medical examination without abnormal findings: Secondary | ICD-10-CM

## 2013-04-04 MED ORDER — NIFEDIPINE ER OSMOTIC RELEASE 90 MG PO TB24: 90.0000 mg | ORAL_TABLET | Freq: Every day | ORAL | Status: DC

## 2013-04-04 MED ORDER — BENAZEPRIL HCL 40 MG PO TABS: 40.0000 mg | ORAL_TABLET | Freq: Every day | ORAL | Status: DC

## 2013-04-04 MED ORDER — HYDROCHLOROTHIAZIDE 12.5 MG PO CAPS: 12.5000 mg | ORAL_CAPSULE | Freq: Every day | ORAL | Status: DC

## 2013-04-04 MED ORDER — SIMVASTATIN 10 MG PO TABS: ORAL_TABLET | ORAL | Status: DC

## 2013-04-04 NOTE — Patient Instructions (Signed)
1. Please continue to take the same medications as you did. Do not start taking the new mediations (HCTZ, also known as hydrochlorothiazide) that I mentioned to you. I already cancelled the order of HCTZ.  2. You have done great job in taking all your medications. I appreciate it very much. Please continue doing that. 3. If you have worsening of your symptoms or new symptoms arise, please call the clinic [phone]), or go to the ER immediately if symptoms are severe.

## 2013-04-04 NOTE — Assessment & Plan Note (Signed)
BP Readings from Last 3 Encounters:  04/04/13 151/79  01/03/13 137/78  10/27/12 151/82    Lab Results  Component Value Date   NA 138 10/27/2012   K 3.7 10/27/2012   CREATININE 1.31 10/27/2012    Assessment: Blood pressure control: controlled Progress toward BP goal:  at goal Comments:   Plan: Medications:  continue current medications Educational resources provided: brochure Self management tools provided:   Other plans: His blood pressure is well controlled. Today blood pressure is 151/79. According to the new PCN 8 guildeline, his goal of blood pressure is <150/90 mmHg. Will continue the current regimen. Will check his BMP today.

## 2013-04-04 NOTE — Assessment & Plan Note (Signed)
Patient's recent LDL was 77 on 10/27/12. He is currently taking Zocor 10 mg daily. His previous AST and ALT on 10/27/12 were normal. He did not noticed any side effects, such as muscle pain. Will continue current regimen.

## 2013-04-04 NOTE — Progress Notes (Signed)
Patient ID: [name] [name], male   DOB: 17-Oct-1930, 79 y.o.   MRN: [id number] Subjective:   Patient ID: [name] [name] male   DOB: 08-03-1930 79 y.o.   MRN: [id number]  CC:  Follow up visit. HPI:  [name] is a 79 y.o. man with past medical history as outlined below, who presents for a followup visit today  1. HTN: His currently taking Lotensin 40 mg daily and nifedipine 90 mg daily. Blood pressure is 151/79 mmHg. Patient does not have chest pain, shortness of breath, or leg edema. According to the [facility] 8 guild[place], his goal for blood pressure is 150/90 mmHg.   2. HLD: Patient's recent LDL was 77 on 10/27/12. He is currently taking Zocor 10 mg daily. His previous AST and ALT on 10/27/12 were normal. He did not noticed any side effects, such as muscle pain.  3. Hx of prostate cancer: He has history of prostrate cancer status post radiation about 13 years ago. Currently he is asymptomatic. His PSA was 1.28 on 10/27/12.         03/20/2009 20:17  09/15/2010 22:35  10/27/2012 11:21   PSA    2.52  1.07  1.28    ROS: Denies fever, chills, fatigue, headaches,  cough, chest pain, SOB,  abdominal pain,diarrhea, constipation, dysuria, urgency, frequency, hematuria, joint pain or leg swelling.    Past Medical History  Diagnosis Date  . Hyperlipidemia   . Hypertension   . H/O prostate cancer     about 1 3 years ago, s/p radiation   Current Outpatient Prescriptions  Medication Sig Dispense Refill  . benazepril (LOTENSIN) 40 MG tablet Take 1 tablet (40 mg total) by mouth daily.  90 tablet  3  . NIFEdipine (PROCARDIA XL/ADALAT-CC) 90 MG 24 hr tablet Take 1 tablet (90 mg total) by mouth daily.  90 tablet  3  . simvastatin (ZOCOR) 10 MG tablet TAKE 1 TABLET EVERY MORNING  90 tablet  3   No current facility-administered medications for this visit.   Family History  Problem Relation Age of Onset  . Hypertension Father   . Hypertension Sister   . Hypertension Brother    History    Social History  . Marital Status: Married    Spouse Name: N/A    Number of Children: N/A  . Years of Education: N/A   Social History Main Topics  . Smoking status: Former Smoker -- 1.00 packs/day for 25 years  . Smokeless tobacco: None     Comment: quit at 15 years ago  . Alcohol Use: None  . Drug Use: None  . Sexually Active: None   Other Topics Concern  . None   Social History Narrative  . None    Review of Systems: as in HPI.   Objective:  Physical Exam: Filed Vitals:   04/04/13 1326  BP: 151/79  Pulse: 62  Temp: 97 F (36.1 C)  TempSrc: Oral  Height: 6\' 1"  (1.854 m)  Weight: 209 lb 9.6 oz (95.074 kg)  SpO2: 97%   Constitutional: Vital signs reviewed.  Patient is a well-developed and well-nourished, in no acute distress and cooperative with exam.   HEENT:  Head: Normocephalic and atraumatic Ear: TM normal bilaterally Mouth: no erythema or exudates, MMM Eyes: PERRL, EOMI, conjunctivae normal, No scleral icterus.  Neck: Supple, Trachea midline normal ROM, No JVD, mass, thyromegaly, or carotid bruit present. No lymph node enlargement. Cardiovascular: RRR, S1 normal, S2 normal, no MRG, pulses symmetric and intact bilaterally  Pulmonary/Chest: CTAB, no wheezes, rales, or rhonchi Abdominal: Soft. Non-tender, non-distended, bowel sounds are normal, no masses, organomegaly, or guarding present.  GU: no CVA tenderness Musculoskeletal: No joint deformities, erythema, or stiffness, ROM full and non-tender Hematology: no cervical, inginal, or axillary adenopathy.  Neurological: A&O x3, Strength is normal and symmetric bilaterally, cranial nerve II-XII are grossly intact, no focal motor deficit, sensory intact to light touch bilaterally. Brachial reflex 2+ bilaterally. Knee reflex 2+ bilaterally. Babinski's sign negative. Finger to nose test normal. Skin: Warm, dry and intact. No rash, cyanosis, or clubbing.  Psychiatric: Normal mood and affect. speech and behavior is  normal. Judgment and thought content normal. Cognition and memory are normal.   Assessment & Plan:

## 2013-04-04 NOTE — Assessment & Plan Note (Signed)
-   Flu shot is up-to-date  - Patient confirmed that he had pneumococcal vaccination 4 years ago at health service clinic.  - USPSTF recommends against routine screening for colorectal cancer in adults ages 58 to 12 years. There may be considerations that support colorectal cancer screening in an individual patient. Patient has never noticed blood in his stool. Will not do colonoscopy.

## 2013-04-04 NOTE — Progress Notes (Signed)
Case discussed with Dr. Blaine Hamper (at time of visit, soon after the resident saw the patient).  We reviewed the resident's history and exam and pertinent patient test results.  I agree with the assessment, diagnosis, and plan of care documented in the resident's note.

## 2013-04-05 LAB — BASIC METABOLIC PANEL WITH GFR
Chloride: 101 mEq/L (ref 96–112)
Sodium: 138 mEq/L (ref 135–145)

## 2013-04-14 ENCOUNTER — Other Ambulatory Visit: Payer: Self-pay | Admitting: Internal Medicine

## 2013-04-14 ENCOUNTER — Telehealth: Payer: Self-pay | Admitting: Internal Medicine

## 2013-04-14 DIAGNOSIS — N179 Acute kidney failure, unspecified: Secondary | ICD-10-CM | POA: Insufficient documentation

## 2013-04-14 NOTE — Telephone Encounter (Signed)
I called patient to have informed him that I would like to repeat his BMP for rechecking his renal function and he agreed to come to the lab in next week.  Ivor Costa, MD PGY2, Internal Medicine Teaching Service Pager: [phone]

## 2013-04-17 ENCOUNTER — Other Ambulatory Visit: Payer: Self-pay | Admitting: Internal Medicine

## 2013-04-17 ENCOUNTER — Other Ambulatory Visit (INDEPENDENT_AMBULATORY_CARE_PROVIDER_SITE_OTHER): Payer: Medicare Other

## 2013-04-17 DIAGNOSIS — E559 Vitamin D deficiency, unspecified: Secondary | ICD-10-CM

## 2013-04-17 NOTE — Addendum Note (Signed)
Addended by: Truddie Crumble on: 04/17/2013 01:20 PM   Modules accepted: Orders

## 2013-04-18 LAB — BASIC METABOLIC PANEL WITH GFR
CO2: 24 mEq/L (ref 19–32)
GFR, Est African American: 49 mL/min — ABNORMAL LOW
GFR, Est Non African American: 42 mL/min — ABNORMAL LOW
Potassium: 3.9 mEq/L (ref 3.5–5.3)
Sodium: 137 mEq/L (ref 135–145)

## 2013-04-18 LAB — VITAMIN D 25 HYDROXY (VIT D DEFICIENCY, FRACTURES): Vit D, 25-Hydroxy: 16 ng/mL — ABNORMAL LOW (ref 30–89)

## 2013-04-23 ENCOUNTER — Other Ambulatory Visit: Payer: Self-pay | Admitting: Internal Medicine

## 2013-04-23 DIAGNOSIS — I10 Essential (primary) hypertension: Secondary | ICD-10-CM

## 2013-04-24 ENCOUNTER — Other Ambulatory Visit: Payer: Self-pay | Admitting: Internal Medicine

## 2013-04-24 DIAGNOSIS — E559 Vitamin D deficiency, unspecified: Secondary | ICD-10-CM

## 2013-04-24 MED ORDER — ERGOCALCIFEROL 1.25 MG (50000 UT) PO CAPS: 50000.0000 [IU] | ORAL_CAPSULE | ORAL | Status: DC

## 2013-05-31 ENCOUNTER — Other Ambulatory Visit: Payer: Self-pay

## 2013-06-12 ENCOUNTER — Other Ambulatory Visit: Payer: Self-pay | Admitting: Internal Medicine

## 2013-08-30 ENCOUNTER — Other Ambulatory Visit: Payer: Medicare Other

## 2013-08-31 ENCOUNTER — Other Ambulatory Visit: Payer: Self-pay

## 2013-09-05 ENCOUNTER — Encounter: Payer: Self-pay | Admitting: Internal Medicine

## 2013-09-05 ENCOUNTER — Ambulatory Visit (INDEPENDENT_AMBULATORY_CARE_PROVIDER_SITE_OTHER): Payer: Medicare Other | Admitting: Internal Medicine

## 2013-09-05 VITALS — BP 150/80 | HR 55 | Temp 97.3°F | Ht 73.5 in | Wt 214.8 lb

## 2013-09-05 DIAGNOSIS — E785 Hyperlipidemia, unspecified: Secondary | ICD-10-CM

## 2013-09-05 DIAGNOSIS — Z0000 Encounter for general adult medical examination without abnormal findings: Secondary | ICD-10-CM

## 2013-09-05 DIAGNOSIS — Z23 Encounter for immunization: Secondary | ICD-10-CM

## 2013-09-05 DIAGNOSIS — I10 Essential (primary) hypertension: Secondary | ICD-10-CM

## 2013-09-05 DIAGNOSIS — Z8546 Personal history of malignant neoplasm of prostate: Secondary | ICD-10-CM

## 2013-09-05 LAB — BASIC METABOLIC PANEL WITH GFR
BUN: 21 mg/dL (ref 6–23)
Calcium: 9.4 mg/dL (ref 8.4–10.5)
Creat: 1.56 mg/dL — ABNORMAL HIGH (ref 0.50–1.35)
GFR, Est African American: 47 mL/min — ABNORMAL LOW
GFR, Est Non African American: 41 mL/min — ABNORMAL LOW

## 2013-09-05 MED ORDER — BENAZEPRIL HCL 40 MG PO TABS: 40.0000 mg | ORAL_TABLET | Freq: Every day | ORAL | Status: DC

## 2013-09-05 MED ORDER — NIFEDIPINE ER OSMOTIC RELEASE 90 MG PO TB24: 90.0000 mg | ORAL_TABLET | Freq: Every day | ORAL | Status: DC

## 2013-09-05 MED ORDER — SIMVASTATIN 10 MG PO TABS: ORAL_TABLET | ORAL | Status: DC

## 2013-09-05 NOTE — Assessment & Plan Note (Addendum)
-  Patient reports that he got a flu shot at [facility] 3 months ago. -He reports that he got pneumococcal vaccination approximately 5 years ago. This means that he got pneumococcal vaccination after 65, therefore he does not need another dose. -Patient refused Zostavax today.  - He does not need colonoscopy given his age of 9.

## 2013-09-05 NOTE — Assessment & Plan Note (Signed)
Patient's recent LDL was 77 on 10/27/12. He is currently taking Zocor 10 mg daily. His previous AST and ALT on 10/27/12 were normal. Will continue current regimen.

## 2013-09-05 NOTE — Patient Instructions (Signed)
1. You have done great job in taking all your medications. I appreciate it very much. Please continue doing that. 2. I will check your kidney function today to assure your kidney is functioning well.  3. If you have worsening of your symptoms or new symptoms arise, please call the clinic [phone]), or go to the ER immediately if symptoms are severe.

## 2013-09-05 NOTE — Assessment & Plan Note (Signed)
BP Readings from Last 3 Encounters:  09/05/13 150/80  04/04/13 151/79  01/03/13 137/78    Lab Results  Component Value Date   NA 137 04/17/2013   K 3.9 04/17/2013   CREATININE 1.53* 04/17/2013    Assessment: Blood pressure control: controlled Progress toward BP goal:  at goal Comments:   Plan: Medications:  continue current medications Educational resources provided: brochure;handout;video Self management tools provided:   Other plans: According to the [facility] 8 [address], his goal for blood pressure is <150/90 mmHg. Today his Bp is 150/80 mmHg. Will continue Lotensin 40 mg daily and nifedipine 90 mg daily. Patient's Cre slightly elevated at 1.53 on 04/17/13. Will check BMP for renal function monitoring.

## 2013-09-05 NOTE — Assessment & Plan Note (Signed)
It is stable. No new issues. Currently he is asymptomatic. No bone pain. His PSA was 1.28 on 10/27/12. Will follow up.

## 2013-09-05 NOTE — Progress Notes (Signed)
Patient ID: [name] [name], male   DOB: 17-Sep-1930, 79 y.o.   MRN: [id number] Subjective:   Patient ID: [name] [name] male   DOB: 12-Oct-1930 79 y.o.   MRN: [id number]  CC:   Follow up visit.   HPI:  [name] [name] is a 79 y.o. man  with past medical history as outlined below, who presents for a followup visit today.  Patient reports that he has been doing well. He does not have any new complaints today.   1. HTN: The patient reports that he has been taking all his medications regularly. He is currently taking Lotensin 40 mg daily and nifedipine 90 mg daily. Blood pressure is 150/80 mmHg today (according to the [facility] 8 guild[facility], his goal for blood pressure is 150/90 mmHg). Patient does not have chest pain, shortness of breath, or leg edema.   2. HLD: Patient's recent LDL was 77 on 10/27/12. He is currently taking Zocor 10 mg daily. His previous AST and ALT on 10/27/12 were normal. He did not noticed any side effects, such as muscle pain.  3. Hx of prostate cancer: He has history of prostrate cancer status post radiation about 13 years ago. Currently he is asymptomatic. No bone pain. His PSA was 1.28 on 10/27/12.            03/20/2009 20:17   09/15/2010 22:35   10/27/2012 11:21    PSA      2.52   1.07   1.28     ROS: Denies fever, chills, fatigue, headaches,  cough, chest pain, SOB,  abdominal pain,diarrhea, constipation, dysuria, urgency, frequency, hematuria, joint pain or leg swelling.   Past Medical History  Diagnosis Date  . Hyperlipidemia   . Hypertension   . H/O prostate cancer     about 1 3 years ago, s/p radiation   Current Outpatient Prescriptions  Medication Sig Dispense Refill  . benazepril (LOTENSIN) 40 MG tablet Take 1 tablet (40 mg total) by mouth daily.  90 tablet  3  . ergocalciferol (VITAMIN D2) 50000 UNITS capsule Take 1 capsule (50,000 Units total) by mouth once a week.  6 capsule  0  . NIFEdipine (ADALAT CC) 90 MG 24 hr tablet EVERY DAY  90 tablet  3   . NIFEdipine (PROCARDIA XL/ADALAT-CC) 90 MG 24 hr tablet Take 1 tablet (90 mg total) by mouth daily.  90 tablet  3  . simvastatin (ZOCOR) 10 MG tablet TAKE 1 TABLET EVERY MORNING  90 tablet  3   No current facility-administered medications for this visit.   Family History  Problem Relation Age of Onset  . Hypertension Father   . Hypertension Sister   . Hypertension Brother    History   Social History  . Marital Status: Married    Spouse Name: N/A    Number of Children: N/A  . Years of Education: N/A   Social History Main Topics  . Smoking status: Former Smoker -- 1.00 packs/day for 25 years  . Smokeless tobacco: None     Comment: quit at 15 years ago  . Alcohol Use: None  . Drug Use: None  . Sexual Activity: None   Other Topics Concern  . None   Social History Narrative  . None    Review of Systems: Full 14-point review of systems otherwise negative. See HPI.   Objective:  Physical Exam: Filed Vitals:   09/05/13 1403  BP: 150/80  Pulse: 55  Temp: 97.3 F (36.3 C)  TempSrc: Oral  Height: 6' 1.5" (1.867 m)  Weight: 214 lb 12.8 oz (97.433 kg)  SpO2: 96%   Constitutional: Vital signs reviewed.  Patient is a well-developed and well-nourished, in no acute distress and cooperative with exam.   HEENT:  Head: Normocephalic and atraumatic Eyes: PERRL, EOMI, conjunctivae normal, No scleral icterus.  Neck: Supple, Trachea midline normal ROM, No JVD, mass, thyromegaly, or carotid bruit present. No lymph node enlargement. Cardiovascular: RRR, S1 normal, S2 normal, no MRG, pulses symmetric and intact bilaterally Pulmonary/Chest: CTAB, no wheezes, rales, or rhonchi Abdominal: Soft. Non-tender, non-distended, bowel sounds are normal, no masses, organomegaly, or guarding present.   Musculoskeletal: No joint deformities, erythema, or stiffness, ROM full and non-tender Hematology: no cervical, inginal, or axillary adenopathy.  Neurological: A&O x3, Strength is normal and  symmetric bilaterally, cranial nerve II-XII are grossly intact, no focal motor deficit, sensory intact to light touch bilaterally.  Skin: Warm, dry and intact. No rash, cyanosis, or clubbing.  Psychiatric: Normal mood and affect. speech and behavior is normal. Judgment and thought content normal. Cognition and memory are normal.     Assessment & Plan:

## 2013-09-06 NOTE — Progress Notes (Signed)
Case discussed with Dr. Blaine Hamper at the time of the visit.  We reviewed the resident's history and exam and pertinent patient test results.  I agree with the assessment, diagnosis and plan of care documented in the resident's note.

## 2013-12-12 ENCOUNTER — Ambulatory Visit: Payer: Medicare Other | Admitting: Internal Medicine

## 2013-12-14 ENCOUNTER — Ambulatory Visit: Payer: Medicare Other | Admitting: Internal Medicine

## 2013-12-21 ENCOUNTER — Ambulatory Visit: Payer: Medicare Other | Admitting: Internal Medicine

## 2014-01-02 ENCOUNTER — Ambulatory Visit (INDEPENDENT_AMBULATORY_CARE_PROVIDER_SITE_OTHER): Payer: Medicare Other | Admitting: Internal Medicine

## 2014-01-02 ENCOUNTER — Encounter: Payer: Self-pay | Admitting: Internal Medicine

## 2014-01-02 ENCOUNTER — Encounter: Payer: Medicare Other | Admitting: Internal Medicine

## 2014-01-02 VITALS — BP 147/87 | HR 69 | Temp 97.0°F | Ht 74.0 in | Wt 217.7 lb

## 2014-01-02 DIAGNOSIS — I10 Essential (primary) hypertension: Secondary | ICD-10-CM

## 2014-01-02 DIAGNOSIS — Z8546 Personal history of malignant neoplasm of prostate: Secondary | ICD-10-CM

## 2014-01-02 DIAGNOSIS — E785 Hyperlipidemia, unspecified: Secondary | ICD-10-CM

## 2014-01-02 NOTE — Assessment & Plan Note (Signed)
Patient's recent LDL was 77 on 10/27/12. He is currently taking Zocor 10 mg daily. His previous AST and ALT on 10/27/12 were normal. He did not noticed any side effects, such as muscle pain. Will continue current regimen

## 2014-01-02 NOTE — Patient Instructions (Addendum)
1. You have done great job in taking all your medications. I appreciate it very much. Please continue doing that. 2. Please take all medications as prescribed.  3. If you have worsening of your symptoms or new symptoms arise, please call the clinic [phone]), or go to the ER immediately if symptoms are severe.  Please bring in all your medication bottles with you in next visit.

## 2014-01-02 NOTE — Progress Notes (Signed)
Patient ID: [name] [name], male   DOB: 1930/09/18, 80 y.o.   MRN: [id number] Subjective:   Patient ID: [name] [name] male   DOB: 1930-01-05 80 y.o.   MRN: [id number]  CC:   Follow up visit.    HPI:  [name] is a 80 y.o. man with past medical history as outlined below, who presents for a followup visit today  HPI:  [name] is a 80 y.o. man with past medical history as outlined below, who presents for a followup visit today.  1. HTN: The patient reports that he has been taking all his medications regularly. He is currently taking Lotensin 40 mg daily and nifedipine 90 mg daily. Blood pressure is 147/87  mmHg today (according to the [facility] 8 [place], his goal for blood pressure is <150/90 mmHg). Patient does not have chest pain, shortness of breath, or leg edema.   2. HLD: Patient's recent LDL was 77 on 10/27/12. He is currently taking Zocor 10 mg daily. His previous AST and ALT on 10/27/12 were normal. He did not noticed any side effects, such as muscle pain.  3. Hx of prostate cancer: He has history of prostrate cancer status post radiation about 13 years ago. Currently he has chronic polyuria which has not changed in nature.  He does not have dysuria or burning on urination. No bone pain. His PSA was 1.28 on 10/27/12.              03/20/2009 20:17    09/15/2010 22:35    10/27/2012 11:21     PSA        2.52    1.07    1.28      ROS: Denies fever, chills, fatigue, headaches,  cough, chest pain, SOB,  abdominal pain,diarrhea, constipation, dysuria, urgency, frequency, hematuria, joint pain or leg swelling.   Past Medical History  Diagnosis Date  . Hyperlipidemia   . Hypertension   . H/O prostate cancer     about 1 3 years ago, s/p radiation   Current Outpatient Prescriptions  Medication Sig Dispense Refill  . benazepril (LOTENSIN) 40 MG tablet Take 1 tablet (40 mg total) by mouth daily.  30 tablet  5  . NIFEdipine (PROCARDIA XL/ADALAT-CC) 90 MG 24 hr  tablet Take 1 tablet (90 mg total) by mouth daily.  30 tablet  5  . simvastatin (ZOCOR) 10 MG tablet TAKE 1 TABLET EVERY MORNING  30 tablet  5   No current facility-administered medications for this visit.   Family History  Problem Relation Age of Onset  . Hypertension Father   . Hypertension Sister   . Hypertension Brother    History   Social History  . Marital Status: Married    Spouse Name: N/A    Number of Children: N/A  . Years of Education: N/A   Social History Main Topics  . Smoking status: Former Smoker -- 1.00 packs/day for 25 years  . Smokeless tobacco: None     Comment: quit at 15 years ago  . Alcohol Use: None  . Drug Use: None  . Sexual Activity: None   Other Topics Concern  . None   Social History Narrative  . None    Review of Systems: Full 14-point review of systems otherwise negative. See HPI.   Objective:  Physical Exam: Filed Vitals:   01/02/14 1319  BP: 147/87  Pulse: 69  Temp: 97 F (36.1 C)  Height: 6\' 2"  (1.88 m)  Weight: 217  lb 11.2 oz (98.748 kg)  SpO2: 96%    Constitutional: Vital signs reviewed.  Patient is a well-developed and well-nourished, in no acute distress and cooperative with exam.    HEENT:   Head: Normocephalic and atraumatic Eyes: PERRL, EOMI, conjunctivae normal, No scleral icterus.   Neck: Supple, Trachea midline normal ROM, No JVD, mass, thyromegaly, or carotid bruit present. No lymph node enlargement. Cardiovascular: RRR, S1 normal, S2 normal, no MRG, pulses symmetric and intact bilaterally Pulmonary/Chest: CTAB, no wheezes, rales, or rhonchi Abdominal: Soft. Non-tender, non-distended, bowel sounds are normal, no masses, organomegaly, or guarding present.   Musculoskeletal: No joint deformities, erythema, or stiffness, ROM full and non-tender Hematology: no cervical, inginal, or axillary adenopathy.   Neurological: A&O x3, Strength is normal and symmetric bilaterally, cranial nerve II-XII are grossly intact, no  focal motor deficit, sensory intact to light touch bilaterally.   Skin: Warm, dry and intact. No rash, cyanosis, or clubbing.   Psychiatric: Normal mood and affect. speech and behavior is normal. Judgment and thought content normal. Cognition and memory are normal.     Assessment & Plan:  ;i

## 2014-01-02 NOTE — Assessment & Plan Note (Addendum)
BP Readings from Last 3 Encounters:  01/02/14 147/87  09/05/13 150/80  04/04/13 151/79    Lab Results  Component Value Date   NA 139 09/05/2013   K 3.8 09/05/2013   CREATININE 1.56* 09/05/2013    Assessment: Blood pressure control: controlled Progress toward BP goal:  at goal Comments:   Plan: Medications:  continue current medications Educational resources provided: brochure Self management tools provided: home blood pressure logbook Other plans: bp is at goal. Will continue current regimen, Lotensin 40 mg daily and Nifedipine 90 mg daily.

## 2014-01-02 NOTE — Assessment & Plan Note (Signed)
Stable. No new issues. Will continue to observe.

## 2014-01-03 NOTE — Progress Notes (Signed)
Case discussed with Dr. Niu soon after the resident saw the patient.  We reviewed the resident's history and exam and pertinent patient test results.  I agree with the assessment, diagnosis, and plan of care documented in the resident's note. 

## 2014-02-14 ENCOUNTER — Other Ambulatory Visit: Payer: Self-pay | Admitting: Internal Medicine

## 2014-03-17 ENCOUNTER — Other Ambulatory Visit: Payer: Self-pay | Admitting: Internal Medicine

## 2014-03-17 DIAGNOSIS — E785 Hyperlipidemia, unspecified: Secondary | ICD-10-CM

## 2014-03-22 ENCOUNTER — Other Ambulatory Visit: Payer: Self-pay | Admitting: Internal Medicine

## 2014-03-22 DIAGNOSIS — E785 Hyperlipidemia, unspecified: Secondary | ICD-10-CM

## 2014-07-03 ENCOUNTER — Encounter: Payer: Self-pay | Admitting: Internal Medicine

## 2014-07-03 ENCOUNTER — Ambulatory Visit (INDEPENDENT_AMBULATORY_CARE_PROVIDER_SITE_OTHER): Payer: Medicare Other | Admitting: Internal Medicine

## 2014-07-03 VITALS — BP 153/74 | HR 66 | Temp 98.4°F | Ht 73.5 in | Wt 211.9 lb

## 2014-07-03 DIAGNOSIS — Z0000 Encounter for general adult medical examination without abnormal findings: Secondary | ICD-10-CM

## 2014-07-03 DIAGNOSIS — N183 Chronic kidney disease, stage 3 unspecified: Secondary | ICD-10-CM

## 2014-07-03 DIAGNOSIS — Z8546 Personal history of malignant neoplasm of prostate: Secondary | ICD-10-CM

## 2014-07-03 DIAGNOSIS — E785 Hyperlipidemia, unspecified: Secondary | ICD-10-CM

## 2014-07-03 DIAGNOSIS — I10 Essential (primary) hypertension: Secondary | ICD-10-CM

## 2014-07-03 LAB — BASIC METABOLIC PANEL
BUN: 17 mg/dL (ref 6–23)
CALCIUM: 9.3 mg/dL (ref 8.4–10.5)
CO2: 25 meq/L (ref 19–32)
Chloride: 104 mEq/L (ref 96–112)
Creat: 1.54 mg/dL — ABNORMAL HIGH (ref 0.50–1.35)
Glucose, Bld: 89 mg/dL (ref 70–99)
POTASSIUM: 3.9 meq/L (ref 3.5–5.3)
SODIUM: 140 meq/L (ref 135–145)

## 2014-07-03 LAB — LIPID PANEL
CHOL/HDL RATIO: 3.3 ratio
Cholesterol: 150 mg/dL (ref 0–200)
HDL: 46 mg/dL (ref >39)
LDL CALC: 76 mg/dL (ref 0–99)
Triglycerides: 138 mg/dL (ref <150)
VLDL: 28 mg/dL (ref 0–40)

## 2014-07-03 NOTE — Patient Instructions (Signed)
It was a pleasure meeting you today, [name] [name].  Here is the plan for today:  1. High Blood Pressure - Continue Nifedipine - Continue Benzapril  2. High Cholesterol - Continue Zocor - Blood work today  3. History of prostate cancer - blood work today  4. Chronic kidney disease - blood work today  5. Health maintenance - Will do DEXA scan at next visit - [facility] - [facility]  General Instructions:   Thank you for bringing your medicines today. This helps [place] keep you safe from mistakes.   Progress Toward Treatment Goals:  Treatment Goal 01/02/2014  Blood pressure at goal    Self Care Goals & Plans:  Self Care Goal 01/02/2014  Manage my medications take my medicines as prescribed; bring my medications to every visit; refill my medications on time  Eat healthy foods drink diet soda or water instead of juice or soda; eat more vegetables; eat foods that are low in salt; eat baked foods instead of fried foods; eat fruit for snacks and desserts  Be physically active -    No flowsheet data found.   Care Management & Community Referrals:  Referral 10/27/2012  Referrals made for care management support none needed  Referrals made to community resources other (see comments)

## 2014-07-03 NOTE — Assessment & Plan Note (Signed)
BP Readings from Last 3 Encounters:  07/03/14 153/74  01/02/14 147/87  09/05/13 150/80    Lab Results  Component Value Date   NA 139 09/05/2013   K 3.8 09/05/2013   CREATININE 1.56* 09/05/2013    Assessment: Blood pressure control: mildly elevated Progress toward BP goal:  unchanged Comments:   Plan: Medications:  continue current medications Educational resources provided:   Self management tools provided:   Other plans: Patient's BP slightly elevated today. Will not make any changes this visit. Continue to monitor. Continue Benazepril 40 mg daily and Nifedipine 90 mg daily.

## 2014-07-03 NOTE — Assessment & Plan Note (Signed)
Patient states [facility]. He was given a prescription for the Zostavax vaccine which he will also [facility]. Discussed DEXA scan with patient and he would like to talk more about scan at next visit. Will follow up at next visit in 6 months.

## 2014-07-03 NOTE — Assessment & Plan Note (Signed)
-   Repeat bmet today to check Cr stable

## 2014-07-03 NOTE — Progress Notes (Signed)
   Subjective:    Patient ID: [name] [name], male    DOB: [birth year], 80 y.o.   MRN: [id number]  HPI [name] [name] is a 80yo man w/ PMHx of prostate cancer s/p radiation treatment 15+ years ago, HTN, HLD, and CKD Stage III who presents for the following:  1. HTN: BP today is 153/74. Patient takes Benazepril 40 mg daily and Nifedipine 90 mg daily.   2. HLD: Last lipid profile in 10/2012 showed Chol 156, Trigly 119, HDL 55, and LDL 77. Patient takes Zocor 10 mg daily.  3. CKD Stage III: Last bmet in 08/2013 showed stable Cr 1.56 and GFR 41. Patient denies any urinary difficulties.   4. Hx of Prostate Cancer: Patient had prostate cancer with radiation therapy 15+ years ago. His PSA has been normal. Last PSA on 10/2012 was 1.28. Patient denies night sweats, weight loss, bone pain, difficulties with urination.  Review of Systems General: Denies fever, chills, changes in appetite HEENT: Denies headaches, ear pain, changes in vision, rhinorrhea, sore throat CV: Denies CP, palpitations, SOB, orthopnea Pulm: Denies SOB, cough, wheezing GI: Denies abdominal pain, nausea, vomiting, diarrhea, constipation, melena, hematochezia GU: See HPI Msk: Denies muscle cramps, joint pains Neuro: Denies weakness, numbness, tingling Skin: Denies rashes, bruising    Objective:   Physical Exam General: appears younger than stated age, sitting up in chair, NAD HEENT: [place]/AT, EOMI, PERRL, sclera anicteric, pharynx non-erythematous, mucus membranes moist Neck: supple, no JVD, no lymphadenopathy CV: RRR, normal S1/S2, no m/g/r Pulm: CTA bilaterally, breaths non-labored, no wheezing Abd: BS+, soft, non-distended, non-tender Ext: warm, no edema, moves all Neuro: alert and oriented x 3, CNs II-XII intact, strength 5/5 in upper and lower extremities bilaterally       Assessment & Plan:

## 2014-07-03 NOTE — Assessment & Plan Note (Signed)
-   Recheck lipid profile today - Continue Zocor 10 mg daily

## 2014-07-03 NOTE — Assessment & Plan Note (Signed)
Last PSA 1.28 on 10/2012 - Recheck PSA today

## 2014-07-04 LAB — PSA: PSA: 1.73 ng/mL (ref <=4.00)

## 2014-07-04 NOTE — Progress Notes (Signed)
I saw and evaluated the patient.  I personally confirmed the key portions of the history and exam documented by Dr. Arcelia Jew and I reviewed pertinent patient test results.  The assessment, diagnosis, and plan were formulated together and I agree with the documentation in the resident's note.

## 2014-07-24 ENCOUNTER — Other Ambulatory Visit: Payer: Self-pay | Admitting: *Deleted

## 2014-07-24 ENCOUNTER — Other Ambulatory Visit: Payer: Self-pay | Admitting: Internal Medicine

## 2014-07-24 DIAGNOSIS — E785 Hyperlipidemia, unspecified: Secondary | ICD-10-CM

## 2014-07-24 MED ORDER — BENAZEPRIL HCL 40 MG PO TABS: 40.0000 mg | ORAL_TABLET | Freq: Every day | ORAL | Status: DC

## 2014-07-24 MED ORDER — SIMVASTATIN 10 MG PO TABS: 10.0000 mg | ORAL_TABLET | Freq: Every day | ORAL | Status: DC

## 2014-07-24 MED ORDER — NIFEDIPINE ER OSMOTIC RELEASE 90 MG PO TB24: 90.0000 mg | ORAL_TABLET | Freq: Every day | ORAL | Status: DC

## 2014-07-24 NOTE — Telephone Encounter (Signed)
Pharmacy requested a 90 day supply on all these meds.  Less cost for patient.  You will need to resend.

## 2014-07-24 NOTE — Telephone Encounter (Signed)
Pt request 90 day Prescription

## 2014-11-05 ENCOUNTER — Other Ambulatory Visit: Payer: Self-pay | Admitting: Internal Medicine

## 2015-02-11 ENCOUNTER — Other Ambulatory Visit: Payer: Self-pay | Admitting: Internal Medicine

## 2015-02-11 ENCOUNTER — Ambulatory Visit (INDEPENDENT_AMBULATORY_CARE_PROVIDER_SITE_OTHER): Payer: Medicare Other | Admitting: Internal Medicine

## 2015-02-11 ENCOUNTER — Encounter: Payer: Self-pay | Admitting: Internal Medicine

## 2015-02-11 VITALS — BP 157/73 | HR 57 | Temp 97.6°F | Wt 210.0 lb

## 2015-02-11 DIAGNOSIS — I10 Essential (primary) hypertension: Secondary | ICD-10-CM | POA: Diagnosis not present

## 2015-02-11 DIAGNOSIS — E785 Hyperlipidemia, unspecified: Secondary | ICD-10-CM

## 2015-02-11 DIAGNOSIS — N183 Chronic kidney disease, stage 3 unspecified: Secondary | ICD-10-CM

## 2015-02-11 DIAGNOSIS — Z0000 Encounter for general adult medical examination without abnormal findings: Secondary | ICD-10-CM | POA: Diagnosis not present

## 2015-02-11 DIAGNOSIS — Z23 Encounter for immunization: Secondary | ICD-10-CM | POA: Diagnosis not present

## 2015-02-11 DIAGNOSIS — Z8546 Personal history of malignant neoplasm of prostate: Secondary | ICD-10-CM

## 2015-02-11 MED ORDER — NIFEDIPINE ER OSMOTIC RELEASE 90 MG PO TB24: 90.0000 mg | ORAL_TABLET | Freq: Every day | ORAL | Status: DC

## 2015-02-11 NOTE — Assessment & Plan Note (Signed)
BP Readings from Last 3 Encounters:  02/11/15 157/73  07/03/14 153/74  01/02/14 147/87    Lab Results  Component Value Date   NA 140 07/03/2014   K 3.9 07/03/2014   CREATININE 1.54* 07/03/2014    Assessment: Blood pressure control: mildly elevated Progress toward BP goal:  deteriorated Comments: BP initially 172/85. On repeat was 157/73 after resting. Patient admitted to eating high salt foods and adding extra salt to foods such as potatoes.   Plan: Medications:  continue current medications Other plans:  - Continue Benazepril 40 mg daily and Nifedipine 90 mg daily - BP recheck in 2 weeks - Patient instructed to cut down on salt intake - If BP elevated at next visit consider adding additional antihypertensive agent

## 2015-02-11 NOTE — Progress Notes (Signed)
   Subjective:    Patient ID: [name] [name], male    DOB: 1930-03-17, 81 y.o.   MRN: [id number]  HPI [name] [name] is a 81yo man w/ PMHx of prostate cancer s/p radiation treatment 15+ years ago, HTN, HLD, and CKD Stage III who presents for the following:  HTN: BP 172/85 today. This is much more elevated than normal. Patient admits to eating high salt foods and adding salt to potatoes. Patient takes Benazepril 40 mg daily and Nifedipine 90 mg daily at home.   HLD: Last lipid profile on 07/03/14 showed Chol 150, Trigly 138, HDL 46, and LDL 76. Patient takes Simvastatin 10 mg daily at home.   CKD Stage III: Last bmet on 07/03/14 showed stable Cr at 1.54. His baseline is ~1.5. A GFR was not obtained. Patient denies any urinary difficulties.   Hx Prostate Cancer: Patient had radiation therapy 15+ years ago. A repeat PSA was done on his last visit on 07/03/14 which was normal (1.73). Patient denies fever, night sweats, weight loss, bone pain, difficulties with urination, and hematuria.    Review of Systems General: Denies fever, chills, night sweats, changes in weight, changes in appetite HEENT: Denies headaches, ear pain, changes in vision, rhinorrhea, sore throat CV: Denies CP, palpitations, SOB, orthopnea Pulm: Denies SOB, cough, wheezing GI: Denies abdominal pain, nausea, vomiting, diarrhea, constipation, melena, hematochezia GU: Denies dysuria, hematuria, frequency Msk: Denies muscle cramps, joint pains Neuro: Denies weakness, numbness, tingling Skin: Denies rashes, bruising    Objective:   Physical Exam General: elderly gentleman sitting up in chair, appears younger than stated age, NAD, pleasant  HEENT: [place]/AT, EOMI, sclera anicteric, mucus membranes moist CV: RRR, no m/g/r Pulm: CTA bilaterally, breaths non-labored Abd: BS+, soft, non-distended, non-tender Ext: warm, no edema, moves all Neuro: alert and oriented x 3, no focal deficits      Assessment & Plan:  Please refer to  individual A&Ps

## 2015-02-11 NOTE — Progress Notes (Signed)
INTERNAL MEDICINE TEACHING ATTENDING ADDENDUM - Kayleen Alig, MD: I reviewed and discussed at the time of visit with the resident Dr. Rivet, the patient's medical history, physical examination, diagnosis and results of pertinent tests and treatment and I agree with the patient's care as documented.  

## 2015-02-11 NOTE — Assessment & Plan Note (Signed)
Cr stable at 1.5 per last bmet on 07/03/14. Denies any urinary symptoms.  - Continue to monitor

## 2015-02-11 NOTE — Assessment & Plan Note (Signed)
Lipids done at last visit on 07/03/14. Well controlled on Zocor 10 mg daily.  - Continue current regimen

## 2015-02-11 NOTE — Assessment & Plan Note (Signed)
-   Patient received Tdap and Prevnar 13 vaccine today - Denied colonoscopy, states he was told he does not need one anymore because of his age. Denied melena, hematochezia, changes in bowel movements.

## 2015-02-11 NOTE — Patient Instructions (Addendum)
It was a pleasure seeing you today, Mr. Chong.  - Decrease your salt intake  - BP recheck in 2 weeks - We may have to start another medication if your BP continues to be elevated    General Instructions:   Thank you for bringing your medicines today. This helps [place] keep you safe from mistakes.   Progress Toward Treatment Goals:  Treatment Goal 07/03/2014  Blood pressure unchanged    Self Care Goals & Plans:  Self Care Goal 07/03/2014  Manage my medications take my medicines as prescribed; bring my medications to every visit  Eat healthy foods -  Be physically active take a walk every day    No flowsheet data found.   Care Management & Community Referrals:  Referral 10/27/2012  Referrals made for care management support none needed  Referrals made to community resources other (see comments)

## 2015-02-11 NOTE — Assessment & Plan Note (Signed)
PSA checked at last visit on 07/03/14. PSA normal at 1.73. Patient denied any symptoms concerning for active malignancy.  - Continue to monitor periodically

## 2015-02-19 ENCOUNTER — Other Ambulatory Visit: Payer: Self-pay | Admitting: Internal Medicine

## 2015-02-19 DIAGNOSIS — I10 Essential (primary) hypertension: Secondary | ICD-10-CM

## 2015-02-19 MED ORDER — BENAZEPRIL HCL 40 MG PO TABS: 40.0000 mg | ORAL_TABLET | Freq: Every day | ORAL | Status: DC

## 2015-03-05 ENCOUNTER — Ambulatory Visit (INDEPENDENT_AMBULATORY_CARE_PROVIDER_SITE_OTHER): Payer: Medicare Other | Admitting: Internal Medicine

## 2015-03-05 ENCOUNTER — Encounter: Payer: Self-pay | Admitting: Internal Medicine

## 2015-03-05 VITALS — BP 131/70 | HR 64 | Temp 98.3°F | Ht 73.5 in | Wt 212.4 lb

## 2015-03-05 DIAGNOSIS — I10 Essential (primary) hypertension: Secondary | ICD-10-CM | POA: Diagnosis not present

## 2015-03-05 NOTE — Progress Notes (Signed)
   Subjective:    Patient ID: [name] [name], male    DOB: [birth year], 81 y.o.   MRN: [id number]  HPI [name] [name] is a 81yo man w/ PMHx of prostate cancer s/p radiation treatment 15+ years ago, HTN, HLD, and CKD Stage III who presents today for a BP recheck.   Patient's BP at last visit 2 weeks ago was elevated at 172/85. Patient had admitted to eating a lot of salt. He was recommended to decrease his salt intake and follow up in 2 weeks. No medication changes were made. He takes Benazepril 40 mg daily and Nifedipine 90 mg daily at home.   Today, patient reports he has cut down on his salt significantly. He is using half as much salt as he normally does. BP today much improved at 131/70. He denies any headaches, dizziness, or lightheadedness.    Review of Systems General: Denies fever, chills, night sweats, changes in weight, changes in appetite HEENT: Denies headaches, ear pain, changes in vision, rhinorrhea, sore throat CV: Denies CP, palpitations, SOB, orthopnea Pulm: Denies SOB, cough, wheezing GI: Denies abdominal pain, nausea, vomiting, diarrhea, constipation, melena, hematochezia GU: Denies dysuria, hematuria, frequency Msk: Denies muscle cramps, joint pains Neuro: Denies weakness, numbness, tingling Skin: Denies rashes, bruising    Objective:   Physical Exam General: elderly man who appears younger than stated age sitting up in chair, NAD, pleasant  HEENT: [place]/AT, EOMI, mucus membranes moist CV: RRR, no m/g/r Pulm: CTA bilaterally, breaths non-labored Abd: BS+, soft, non-tender Ext: warm, no edema Neuro: alert and oriented x 3     Assessment & Plan:  Please refer to A&P documentation.

## 2015-03-05 NOTE — Assessment & Plan Note (Signed)
BP Readings from Last 3 Encounters:  03/05/15 131/70  02/11/15 157/73  07/03/14 153/74    Lab Results  Component Value Date   NA 140 07/03/2014   K 3.9 07/03/2014   CREATININE 1.54* 07/03/2014    Assessment: Blood pressure control: controlled Progress toward BP goal:  at goal Comments: BP much improved after decreasing salt intake.   Plan: Medications:  Continue Nifedipine 90 mg daily and Benazepril 40 mg daily  - f/u in 6 months

## 2015-03-05 NOTE — Patient Instructions (Signed)
BP is great today!  Continue your medications.   General Instructions:   Please bring your medicines with you each time you come to clinic.  Medicines may include prescription medications, over-the-counter medications, herbal remedies, eye drops, vitamins, or other pills.   Progress Toward Treatment Goals:  Treatment Goal 02/11/2015  Blood pressure deteriorated    Self Care Goals & Plans:  Self Care Goal 02/11/2015  Manage my medications take my medicines as prescribed; bring my medications to every visit  Monitor my health keep track of my blood pressure  Eat healthy foods eat foods that are low in salt  Be physically active take a walk every day    No flowsheet data found.   Care Management & Community Referrals:  Referral 10/27/2012  Referrals made for care management support none needed  Referrals made to community resources other (see comments)

## 2015-03-11 NOTE — Progress Notes (Signed)
Internal Medicine Clinic Attending  Case discussed with Dr. Rivet at the time of the visit.  We reviewed the resident's history and exam and pertinent patient test results.  I agree with the assessment, diagnosis, and plan of care documented in the resident's note.  

## 2015-05-13 ENCOUNTER — Other Ambulatory Visit: Payer: Self-pay | Admitting: Internal Medicine

## 2015-07-30 ENCOUNTER — Ambulatory Visit (INDEPENDENT_AMBULATORY_CARE_PROVIDER_SITE_OTHER): Payer: Medicare Other | Admitting: Internal Medicine

## 2015-07-30 ENCOUNTER — Encounter: Payer: Self-pay | Admitting: Internal Medicine

## 2015-07-30 VITALS — BP 158/82 | HR 64 | Temp 98.1°F | Ht 73.5 in | Wt 216.4 lb

## 2015-07-30 DIAGNOSIS — I10 Essential (primary) hypertension: Secondary | ICD-10-CM

## 2015-07-30 DIAGNOSIS — Z0000 Encounter for general adult medical examination without abnormal findings: Secondary | ICD-10-CM

## 2015-07-30 DIAGNOSIS — N183 Chronic kidney disease, stage 3 unspecified: Secondary | ICD-10-CM

## 2015-07-30 DIAGNOSIS — M25562 Pain in left knee: Secondary | ICD-10-CM

## 2015-07-30 DIAGNOSIS — E785 Hyperlipidemia, unspecified: Secondary | ICD-10-CM

## 2015-07-30 DIAGNOSIS — I129 Hypertensive chronic kidney disease with stage 1 through stage 4 chronic kidney disease, or unspecified chronic kidney disease: Secondary | ICD-10-CM | POA: Diagnosis present

## 2015-07-30 DIAGNOSIS — Z79899 Other long term (current) drug therapy: Secondary | ICD-10-CM | POA: Diagnosis not present

## 2015-07-30 MED ORDER — DICLOFENAC SODIUM 1 % TD GEL: 2.0000 g | Freq: Four times a day (QID) | TRANSDERMAL | Status: DC

## 2015-07-30 MED ORDER — LISINOPRIL 40 MG PO TABS: 40.0000 mg | ORAL_TABLET | Freq: Every day | ORAL | Status: DC

## 2015-07-30 NOTE — Patient Instructions (Signed)
-   Stop taking Benazepril - Start taking Lisinopril 40 mg daily for your blood pressure - You can use voltaren gel and apply this to your left knee 4 times a day - Also try Tylenol for pain  General Instructions:   Please bring your medicines with you each time you come to clinic.  Medicines may include prescription medications, over-the-counter medications, herbal remedies, eye drops, vitamins, or other pills.   Progress Toward Treatment Goals:  Treatment Goal 03/05/2015  Blood pressure at goal    Self Care Goals & Plans:  Self Care Goal 07/30/2015  Manage my medications take my medicines as prescribed; bring my medications to every visit; refill my medications on time  Monitor my health keep track of my blood pressure  Eat healthy foods eat more vegetables; eat foods that are low in salt; eat baked foods instead of fried foods  Be physically active find an activity I enjoy    No flowsheet data found.   Care Management & Community Referrals:  Referral 10/27/2012  Referrals made for care management support none needed  Referrals made to community resources other (see comments)

## 2015-07-31 LAB — BMP8+ANION GAP
Anion Gap: 21 mmol/L — ABNORMAL HIGH (ref 10.0–18.0)
BUN/Creatinine Ratio: 11 (ref 10–22)
BUN: 15 mg/dL (ref 8–27)
CHLORIDE: 99 mmol/L (ref 97–108)
CO2: 21 mmol/L (ref 18–29)
Calcium: 9.3 mg/dL (ref 8.6–10.2)
Creatinine, Ser: 1.4 mg/dL — ABNORMAL HIGH (ref 0.76–1.27)
GFR calc non Af Amer: 46 mL/min/{1.73_m2} — ABNORMAL LOW (ref >59)
GFR, EST AFRICAN AMERICAN: 53 mL/min/{1.73_m2} — AB (ref >59)
GLUCOSE: 90 mg/dL (ref 65–99)
Potassium: 3.9 mmol/L (ref 3.5–5.2)
Sodium: 141 mmol/L (ref 134–144)

## 2015-08-03 DIAGNOSIS — M25562 Pain in left knee: Secondary | ICD-10-CM | POA: Insufficient documentation

## 2015-08-03 NOTE — Assessment & Plan Note (Signed)
Patient reports he [facility].

## 2015-08-03 NOTE — Assessment & Plan Note (Signed)
Patient's left knee pain seems to be more medial on exam. I do not suspect arthritis as his pain only occurs at night and not with continued movement/exertion. Differential for medial knee pain includes: medial compartment osteoarthritis, pes anserinus pain syndrome, medial collateral ligament sprain, and medial meniscal tear (meniscus with greatest vulnerability to injury). I suspect that it is likely a sprain vs pes anserinus pain syndrome (medial, absence of induration/swelling).  - Will try trial of voltaren gel QID PRN - Recommended to take Tylenol if voltaren gel not helping as he has CKD

## 2015-08-03 NOTE — Progress Notes (Signed)
   Subjective:    Patient ID: [name] [name], male    DOB: [birth year], 81 y.o.   MRN: [id number]  HPI [name] [name] is a 81yo man with PMHx of HTN, CKD Stage 3, and hyperlipidemia who presents for new left-sided knee pain and follow up of his chronic medical conditions as listed below.   Left Knee Pain: Patient describes knee pain for the past few months as 7/10 in severity, deep, more medially located, and only occurring at night. He states the pain will keep him up half the night, but does not wake him from sleep. He notes the pain usually occurs 1-2 times per week. He has not tried any OTC medications for pain relief. He denies pain with ambulation. He denies trauma to the knee. He denies restriction in movement, swelling, or redness. He does note stiffness sometimes.   HTN: BP mildly elevated at 158/82 today. Patient is on Benazepril and Nifedipine at home.    CKD Stage 3: Last Cr 1.54 in Sept 2015. Patient denies any urinary symptoms.   Hyperlipidemia: Last LDL 76 in 2015. Patient takes Simvastatin 10 mg daily.     Review of Systems General: Denies fever, chills, night sweats, changes in weight, changes in appetite HEENT: Denies headaches, ear pain, changes in vision, rhinorrhea, sore throat CV: Denies CP, palpitations, SOB, orthopnea Pulm: Denies SOB, cough, wheezing GI: Denies abdominal pain, nausea, vomiting, diarrhea, constipation, melena, hematochezia GU: Denies dysuria, hematuria, frequency Msk: Denies muscle cramps Neuro: Denies weakness, numbness, tingling Skin: Denies rashes, bruising Psych: Denies depression, anxiety, hallucinations    Objective:   Physical Exam General: alert, sitting up in chair, NAD HEENT: [place]/AT, EOMI, sclera anicteric, mucus membranes moist CV: RRR, no m/g/r Pulm: CTA bilaterally, breaths non-labored Abd: BS+, soft, non-tender Ext: warm, no peripheral edema. There is mild tenderness to palpation of the left medial knee. No swelling or  erythema present. No fluctuance present on the anterior or posterior aspects of the knee. No difficulties in ambulating.  Neuro: alert and oriented x 3, strength 5/5 in all extremities, gait normal     Assessment & Plan:  Please refer to A&P documentation.

## 2015-08-03 NOTE — Assessment & Plan Note (Signed)
BP mildly elevated today. Will change benazepril to lisinopril 40 mg daily since lisinopril is longer acting. Continue Nifedipine 90 mg daily. Recheck BP in 1 month.

## 2015-08-03 NOTE — Assessment & Plan Note (Signed)
Continue Simvastatin 10 mg daily.

## 2015-08-03 NOTE — Assessment & Plan Note (Signed)
Will check bmet today to assess Cr.

## 2015-08-05 NOTE — Progress Notes (Signed)
Internal Medicine Clinic Attending  Case discussed with Dr. Rivet soon after the resident saw the patient.  We reviewed the resident's history and exam and pertinent patient test results.  I agree with the assessment, diagnosis, and plan of care documented in the resident's note.  

## 2015-08-24 ENCOUNTER — Other Ambulatory Visit: Payer: Self-pay | Admitting: Internal Medicine

## 2015-10-14 ENCOUNTER — Other Ambulatory Visit: Payer: Self-pay | Admitting: Internal Medicine

## 2015-12-03 ENCOUNTER — Ambulatory Visit (INDEPENDENT_AMBULATORY_CARE_PROVIDER_SITE_OTHER): Payer: Medicare Other | Admitting: Internal Medicine

## 2015-12-03 ENCOUNTER — Encounter: Payer: Self-pay | Admitting: Internal Medicine

## 2015-12-03 VITALS — BP 152/69 | HR 56 | Temp 98.0°F | Wt 215.0 lb

## 2015-12-03 DIAGNOSIS — M25562 Pain in left knee: Secondary | ICD-10-CM

## 2015-12-03 DIAGNOSIS — Z8739 Personal history of other diseases of the musculoskeletal system and connective tissue: Secondary | ICD-10-CM | POA: Diagnosis not present

## 2015-12-03 DIAGNOSIS — I10 Essential (primary) hypertension: Secondary | ICD-10-CM | POA: Diagnosis not present

## 2015-12-03 MED ORDER — HYDROCHLOROTHIAZIDE 12.5 MG PO CAPS: 12.5000 mg | ORAL_CAPSULE | Freq: Every day | ORAL | Status: DC

## 2015-12-03 NOTE — Patient Instructions (Signed)
-   Start HCTZ 12.5 mg daily - Continue taking Lisinopril 40 mg daily and Nifedipine 90 mg daily - Remember to continue a low salt diet - We will repeat your blood pressure in 2 weeks - Stop taking HCTZ if you feel dizzy or lightheaded  General Instructions:   Thank you for bringing your medicines today. This helps [place] keep you safe from mistakes.   Progress Toward Treatment Goals:  Treatment Goal 03/05/2015  Blood pressure at goal    Self Care Goals & Plans:  Self Care Goal 12/03/2015  Manage my medications take my medicines as prescribed; bring my medications to every visit; refill my medications on time  Monitor my health keep track of my blood pressure; bring my blood pressure log to each visit  Eat healthy foods eat foods that are low in salt; eat baked foods instead of fried foods  Be physically active find an activity I enjoy; take a walk every day    No flowsheet data found.   Care Management & Community Referrals:  Referral 10/27/2012  Referrals made for care management support none needed  Referrals made to community resources other (see comments)

## 2015-12-03 NOTE — Assessment & Plan Note (Addendum)
Reports his left knee pain has completely resolved.

## 2015-12-03 NOTE — Progress Notes (Signed)
   Subjective:    Patient ID: [name] [name], male    DOB: [birth year], 82 y.o.   MRN: [id number]  HPI Mr. Gabourel is a 82yo man with PMHx of HTN, CKD3, and hyperlipidemia who presents today for follow up of his hypertension.   HTN: BP today is 158/78. Last visit he was switched from Benazepril to Lisinopril as this is longer-acting in hopes to get his BP under better control. He also takes Nifedipine 90 mg daily. His BP is essentially the same as last visit. He denies eating salty foods or adding extra salt to his food. He reports he checks his BP at the drug store and the top number is usually in the 160s.   Left Medial Knee Pain: Last visit he had reported left knee pain that was more on the medial side. He reports his pain has resolved. He needed to use voltaren gel at first but now no longer needs any analgesics. He denies any difficulty ambulating or going up stairs.    Review of Systems General: Denies fever, chills, night sweats, changes in weight, changes in appetite HEENT: Denies headaches, ear pain, changes in vision, rhinorrhea, sore throat CV: Denies CP, palpitations, SOB, orthopnea Pulm: Denies SOB, cough, wheezing GI: Denies abdominal pain, nausea, vomiting, diarrhea, constipation, melena, hematochezia GU: Denies dysuria, hematuria, frequency Msk: Denies muscle cramps, joint pains Neuro: Denies weakness, numbness, tingling Skin: Denies rashes, bruising Psych: Denies depression, anxiety, hallucinations    Objective:   Physical Exam General: elderly man who appears younger than stated age, pleasant, NAD HEENT: [place]/AT, EOMI, sclera anicteric, mucus membranes moist CV: RRR, no m/g/r Pulm: CTA bilaterally, breaths non-labored Abd: BS+, soft, non-tender Ext: warm, no peripheral edema, full ROM in all extremities   Neuro: alert and oriented x 3, no focal deficits    Assessment & Plan:  Please refer to A&P documentation.

## 2015-12-03 NOTE — Assessment & Plan Note (Addendum)
BP Readings from Last 3 Encounters:  12/03/15 152/69  07/30/15 158/82  03/05/15 131/70    Lab Results  Component Value Date   NA 141 07/30/2015   K 3.9 07/30/2015   CREATININE 1.40* 07/30/2015    Assessment: Blood pressure control:  Mildly elevated  Progress toward BP goal:   Unchanged Comments: His BP is still above goal. I would like for him to be under 150/80 given his CKD. He does not eat a high salt diet so I think adding another antihypertensive will be beneficial at this point.   Plan: Medications:  Start HCTZ 12.5 mg daily. Continue Lisinopril 40 mg daily and Nifedipine 90 mg daily.  Educational resources provided: Public relations account executive tools provided: home blood pressure logbook Other plans:  - Instructed to stop HCTZ if he feels lightheaded or dizzy - f/u in 2 weeks for BP recheck and bmet - Unclear why he is on Nifedipine- no hx of arrhythmias. Can consider tapering off Nifedipine and switching to Amlodipine

## 2015-12-09 NOTE — Progress Notes (Signed)
Internal Medicine Clinic Attending  Case discussed with Dr. Rivet at the time of the visit.  We reviewed the resident's history and exam and pertinent patient test results.  I agree with the assessment, diagnosis, and plan of care documented in the resident's note.  

## 2015-12-24 ENCOUNTER — Encounter: Payer: Self-pay | Admitting: Internal Medicine

## 2015-12-24 ENCOUNTER — Ambulatory Visit (INDEPENDENT_AMBULATORY_CARE_PROVIDER_SITE_OTHER): Payer: Medicare Other | Admitting: Internal Medicine

## 2015-12-24 VITALS — BP 151/73 | HR 60 | Temp 97.8°F | Ht 73.0 in | Wt 209.9 lb

## 2015-12-24 DIAGNOSIS — I10 Essential (primary) hypertension: Secondary | ICD-10-CM | POA: Diagnosis not present

## 2015-12-24 NOTE — Patient Instructions (Signed)
We'll just continue to watch your blood pressure and if it becomes higher then we can reconsider adding another medicine.   Continue checking your blood pressure periodically at the drugstore.  Follow up in 1 year unless anything else comes up!  General Instructions:   Thank you for bringing your medicines today. This helps [place] keep you safe from mistakes.   Progress Toward Treatment Goals:  Treatment Goal 03/05/2015  Blood pressure at goal    Self Care Goals & Plans:  Self Care Goal 12/03/2015  Manage my medications take my medicines as prescribed; bring my medications to every visit; refill my medications on time  Monitor my health keep track of my blood pressure; bring my blood pressure log to each visit  Eat healthy foods eat foods that are low in salt; eat baked foods instead of fried foods  Be physically active find an activity I enjoy; take a walk every day    No flowsheet data found.   Care Management & Community Referrals:  Referral 10/27/2012  Referrals made for care management support none needed  Referrals made to community resources other (see comments)

## 2015-12-24 NOTE — Assessment & Plan Note (Signed)
BP Readings from Last 3 Encounters:  12/24/15 151/73  12/03/15 152/69  07/30/15 158/82    Lab Results  Component Value Date   NA 141 07/30/2015   K 3.9 07/30/2015   CREATININE 1.40* 07/30/2015    Assessment: Blood pressure control:  Acceptable Progress toward BP goal:   Unchanged Comments: His BP has essentially remained the same. He did not tolerate HCTZ well and I told him that it was okay to stop this completely. I did want to get his BP under better control with his CKD, but given his age I think it is ok for him to have a higher BP goal.   Plan: Medications:  STOP HCTZ. Continue Lisinopril 40 mg daily and Nifedipine 90 mg daily Other plans:  - Advised him to check his BP randomly at the drugstore and if BP is greater than [id number] systolic to return to the clinic - We discussed we can consider an additional antihypertensive if his BP worsens

## 2015-12-24 NOTE — Progress Notes (Signed)
   Subjective:    Patient ID: [name] [name], male    DOB: [birth year], 82 y.o.   MRN: [id number]  HPI [name] [name] is an 82yo man with PMHx of HTN, CKD stage 3, and hyperlipidemia who presents today for follow up of his hypertension.  His BP was noted to be consistently elevated at the last several visits. He is currently taking Lisinopril 40 mg daily and Nifedipine 90 mg daily. At his last visit, HCTZ 12.5 mg daily was added to his regimen. He reports he took the medication for 5 days, but then had to stop after that as he experienced side effects. He reports experiencing blurry vision, difficulty urinating, and a sharp "shooting" pain in his chest. He reports all symptoms have resolved since he stopped the medication. He denies any chest pain, SOB, or palpitations before taking the medication as well. He denies any dizziness, headaches, abdominal pain, nausea, or muscle cramps.    Review of Systems General: Denies fever, chills, night sweats, changes in weight, changes in appetite HEENT: Denies headaches, ear pain, rhinorrhea, sore throat CV: Denies orthopnea Pulm: Denies cough, wheezing GI: Denies vomiting, diarrhea, constipation, melena, hematochezia GU: Denies dysuria, hematuria, frequency Msk: Denies joint pains Neuro: Denies weakness, numbness, tingling Skin: Denies rashes, bruising Psych: Denies depression, anxiety, hallucinations    Objective:   Physical Exam General: elderly man who appears younger than stated age, pleasant, NAD HEENT: [place]/AT, EOMI, sclera anicteric, mucus membranes moist CV: RRR, no m/g/r Pulm: CTA bilaterally, breaths non-labored Abd: BS+, soft, non-tender Ext: no peripheral edema  Skin: warm, no rashes     Assessment & Plan:  Please refer to A&P documentation.

## 2015-12-28 NOTE — Progress Notes (Signed)
Internal Medicine Clinic Attending  Case discussed with Dr. Rivet at the time of the visit.  We reviewed the resident's history and exam and pertinent patient test results.  I agree with the assessment, diagnosis, and plan of care documented in the resident's note.  

## 2016-01-07 ENCOUNTER — Other Ambulatory Visit: Payer: Self-pay | Admitting: Internal Medicine

## 2016-01-07 NOTE — Addendum Note (Signed)
Addended by: Juliet Rude on: 01/07/2016 04:44 PM   Modules accepted: Orders

## 2016-01-20 ENCOUNTER — Other Ambulatory Visit: Payer: Self-pay | Admitting: Internal Medicine

## 2016-02-22 ENCOUNTER — Other Ambulatory Visit: Payer: Self-pay | Admitting: Internal Medicine

## 2016-02-24 NOTE — Telephone Encounter (Signed)
Appointment on 12/24/2015, told to return in 1 year.

## 2016-05-09 ENCOUNTER — Other Ambulatory Visit: Payer: Self-pay | Admitting: Internal Medicine

## 2016-07-22 ENCOUNTER — Other Ambulatory Visit: Payer: Self-pay | Admitting: Internal Medicine

## 2016-12-15 ENCOUNTER — Encounter: Payer: Medicare Other | Admitting: Internal Medicine

## 2017-01-21 ENCOUNTER — Other Ambulatory Visit: Payer: Self-pay | Admitting: Internal Medicine

## 2017-02-02 ENCOUNTER — Encounter: Payer: Medicare Other | Admitting: Internal Medicine

## 2017-02-04 ENCOUNTER — Other Ambulatory Visit: Payer: Self-pay | Admitting: Internal Medicine

## 2017-02-22 ENCOUNTER — Other Ambulatory Visit: Payer: Self-pay | Admitting: Internal Medicine

## 2017-02-22 NOTE — Telephone Encounter (Signed)
Last seen over a year ago, has appt with pcp on 01/24/2017.Despina Hidden Cassady4/30/201811:56 AM

## 2017-02-23 ENCOUNTER — Encounter: Payer: Self-pay | Admitting: Internal Medicine

## 2017-02-23 ENCOUNTER — Ambulatory Visit (INDEPENDENT_AMBULATORY_CARE_PROVIDER_SITE_OTHER): Payer: Medicare Other | Admitting: Internal Medicine

## 2017-02-23 VITALS — BP 135/62 | HR 54 | Temp 97.7°F | Ht 73.0 in | Wt 198.8 lb

## 2017-02-23 DIAGNOSIS — I10 Essential (primary) hypertension: Secondary | ICD-10-CM

## 2017-02-23 DIAGNOSIS — N183 Chronic kidney disease, stage 3 unspecified: Secondary | ICD-10-CM

## 2017-02-23 DIAGNOSIS — Z87891 Personal history of nicotine dependence: Secondary | ICD-10-CM | POA: Diagnosis not present

## 2017-02-23 DIAGNOSIS — Z923 Personal history of irradiation: Secondary | ICD-10-CM | POA: Diagnosis not present

## 2017-02-23 DIAGNOSIS — E785 Hyperlipidemia, unspecified: Secondary | ICD-10-CM

## 2017-02-23 DIAGNOSIS — E559 Vitamin D deficiency, unspecified: Secondary | ICD-10-CM | POA: Diagnosis not present

## 2017-02-23 DIAGNOSIS — I129 Hypertensive chronic kidney disease with stage 1 through stage 4 chronic kidney disease, or unspecified chronic kidney disease: Secondary | ICD-10-CM | POA: Diagnosis not present

## 2017-02-23 DIAGNOSIS — Z8546 Personal history of malignant neoplasm of prostate: Secondary | ICD-10-CM

## 2017-02-23 DIAGNOSIS — Z7189 Other specified counseling: Secondary | ICD-10-CM

## 2017-02-23 DIAGNOSIS — R634 Abnormal weight loss: Secondary | ICD-10-CM

## 2017-02-23 NOTE — Patient Instructions (Signed)
General Instructions: - Will check labs today - Will call you if need any changes in medications - Please consider discussing your wishes with your family so that they know what to do if you get sick  - Follow up in 6 months   Thank you for bringing your medicines today. This helps [place] keep you safe from mistakes.   Progress Toward Treatment Goals:  Treatment Goal 03/05/2015  Blood pressure at goal    Self Care Goals & Plans:  Self Care Goal 12/03/2015  Manage my medications take my medicines as prescribed; bring my medications to every visit; refill my medications on time  Monitor my health keep track of my blood pressure; bring my blood pressure log to each visit  Eat healthy foods eat foods that are low in salt; eat baked foods instead of fried foods  Be physically active find an activity I enjoy; take a walk every day    No flowsheet data found.   Care Management & Community Referrals:  Referral 10/27/2012  Referrals made for care management support none needed  Referrals made to community resources other (see comments)

## 2017-02-23 NOTE — Progress Notes (Signed)
   CC: Hypertension follow up  HPI:  [name] [name] is a 83 y.o. man with PMHx as noted below who presents today for follow up of his hypertension.  HTN: BP 135/62 today. He is taking Lisinopril 40 mg daily and Nifedipine 90 mg daily. He denies any dizziness.  CKD Stage 3: Baseline Cr has been stable in 1.4-1.5 range. He denies any urinary issues.  Hx Prostate Cancer: s/p radiation therapy in 1999. He no longer follows with Urology. His last PSA was normal at 1.73 in Sept 2015. He denies any urinary issues.  Hyperlipidemia: He is taking Simvastatin 10 mg daily. He is tolerating this medication well.  Intentional Weight Loss: Reports he intentionally has become "mostly vegetarian" (will eat meat about once per month) in efforts to lose weight. His weight was 209 lbs in Feb 2017 and has decreased to 198 lbs today. He reports feeling better since changing his diet and with the weight loss. He also walks in the morning daily.  Vitamin D Deficiency: He has a hx of vitamin D deficiency with low level of 16 in June 2014. This was never repeated. Patient reports he was on vitamin D supplements but is no longer taking this. Denies any falls. He reports he does go outside in the mornings when he takes his daily walk.  Advanced Care Planning: He states he does not have an advanced directive in place and has never been told about one. He is not interested in filling one out at this time. He reports he has 5 sons and 1 daughter (sons all in state and daughter out of state) who all know his wishes. He states he has never had a formal conversation with his children about his wishes because "that's not what our family does, we just know what to do for each other." He states if he did become ill and could get better from the illness then he would be ok with being on life support for a few days. Otherwise, he would not want aggressive interventions.   Past Medical History:  Diagnosis Date  . H/O  prostate cancer    about 1 3 years ago, s/p radiation  . Hyperlipidemia   . Hypertension     Review of Systems:   All negative except per HPI  Physical Exam:  Vitals:   02/23/17 1454  BP: 135/62  Pulse: (!) 54  Temp: 97.7 F (36.5 C)  TempSrc: Oral  SpO2: 96%  Weight: 198 lb 12.8 oz (90.2 kg)  Height: 6\' 1"  (1.854 m)   General: Elderly man in NAD HEENT: EOMI, sclera anicteric, mucus membranes moist CV: RRR, no m/g/r Pulm: CTA bilaterally, breaths non-labored Abd: BS+, soft, non-tender Ext: warm, no peripheral edema Neuro: alert and oriented x 3  Assessment & Plan:   See Encounters Tab for problem based charting.  Patient discussed with Dr. Angelia Mould

## 2017-02-24 DIAGNOSIS — R634 Abnormal weight loss: Secondary | ICD-10-CM | POA: Insufficient documentation

## 2017-02-24 DIAGNOSIS — E559 Vitamin D deficiency, unspecified: Secondary | ICD-10-CM | POA: Insufficient documentation

## 2017-02-24 DIAGNOSIS — Z7189 Other specified counseling: Secondary | ICD-10-CM | POA: Insufficient documentation

## 2017-02-24 LAB — CMP14 + ANION GAP
A/G RATIO: 1.3 (ref 1.2–2.2)
ALT: 14 IU/L (ref 0–44)
AST: 17 IU/L (ref 0–40)
Albumin: 3.9 g/dL (ref 3.5–4.7)
Alkaline Phosphatase: 62 IU/L (ref 39–117)
Anion Gap: 15 mmol/L (ref 10.0–18.0)
BILIRUBIN TOTAL: 0.3 mg/dL (ref 0.0–1.2)
BUN/Creatinine Ratio: 13 (ref 10–24)
BUN: 26 mg/dL (ref 8–27)
CALCIUM: 8.9 mg/dL (ref 8.6–10.2)
CHLORIDE: 100 mmol/L (ref 96–106)
CO2: 25 mmol/L (ref 18–29)
Creatinine, Ser: 2.06 mg/dL — ABNORMAL HIGH (ref 0.76–1.27)
GFR calc Af Amer: 33 mL/min/{1.73_m2} — ABNORMAL LOW (ref >59)
GFR, EST NON AFRICAN AMERICAN: 29 mL/min/{1.73_m2} — AB (ref >59)
GLOBULIN, TOTAL: 3.1 g/dL (ref 1.5–4.5)
Glucose: 79 mg/dL (ref 65–99)
POTASSIUM: 4.2 mmol/L (ref 3.5–5.2)
SODIUM: 140 mmol/L (ref 134–144)
TOTAL PROTEIN: 7 g/dL (ref 6.0–8.5)

## 2017-02-24 LAB — CBC WITH DIFFERENTIAL/PLATELET
BASOS: 1 %
Basophils Absolute: 0.1 10*3/uL (ref 0.0–0.2)
EOS (ABSOLUTE): 0.1 10*3/uL (ref 0.0–0.4)
EOS: 2 %
HEMATOCRIT: 37.5 % (ref 37.5–51.0)
HEMOGLOBIN: 12.7 g/dL — AB (ref 13.0–17.7)
Immature Grans (Abs): 0 10*3/uL (ref 0.0–0.1)
Immature Granulocytes: 0 %
LYMPHS ABS: 1.4 10*3/uL (ref 0.7–3.1)
Lymphs: 30 %
MCH: 30.8 pg (ref 26.6–33.0)
MCHC: 33.9 g/dL (ref 31.5–35.7)
MCV: 91 fL (ref 79–97)
Monocytes Absolute: 0.4 10*3/uL (ref 0.1–0.9)
Monocytes: 9 %
Neutrophils Absolute: 2.7 10*3/uL (ref 1.4–7.0)
Neutrophils: 58 %
Platelets: 268 10*3/uL (ref 150–379)
RBC: 4.12 x10E6/uL — AB (ref 4.14–5.80)
RDW: 12.7 % (ref 12.3–15.4)
WBC: 4.6 10*3/uL (ref 3.4–10.8)

## 2017-02-24 LAB — PSA: PROSTATE SPECIFIC AG, SERUM: 1.7 ng/mL (ref 0.0–4.0)

## 2017-02-24 LAB — VITAMIN D 25 HYDROXY (VIT D DEFICIENCY, FRACTURES): Vit D, 25-Hydroxy: 7.8 ng/mL — ABNORMAL LOW (ref 30.0–100.0)

## 2017-02-24 MED ORDER — CHOLECALCIFEROL 1.25 MG (50000 UT) PO TABS: 50000.0000 [IU] | ORAL_TABLET | ORAL | 0 refills | Status: DC

## 2017-02-24 NOTE — Assessment & Plan Note (Signed)
He has lost 11 lbs within the last year. This was intentional due to dietary changes, including becoming mostly vegetarian. Advised patient to continue with his better eating habits. Will need to monitor his weight closely.

## 2017-02-24 NOTE — Assessment & Plan Note (Signed)
Repeat Vitamin D level checked and was low at 7.8. Will have patient start Cholecalciferol 50,000 units Qweekly. Repeat level in 2 months.

## 2017-02-24 NOTE — Assessment & Plan Note (Signed)
BP well controlled. Continue current regimen of Lisinopril 40 mg daily and Nifedipine 90 mg daily.

## 2017-02-24 NOTE — Assessment & Plan Note (Signed)
Cr has been stable in 1.4-1.5 range. A bmet was checked and revealed his Cr has increased to 2.0. Will discuss avoiding NSAIDs with patient and have him return in 2 months for recheck of his Cr.

## 2017-02-24 NOTE — Assessment & Plan Note (Signed)
Stable. Will check PSA today.  >> PSA returned normal. Continue to monitor yearly.

## 2017-02-24 NOTE — Assessment & Plan Note (Signed)
An attempt was made to have an advanced care planning discussion. Patient was very hesitant to engage in the conversation and stated he did not feel filling out an advanced directive form was necessary. We discussed the components of an advanced directive, including the living will and HCPOA. He states he would likely name of his sons who are local to be his 29 but he did not want to put this in writing. I advised him to at least have a discussion with his children in regards to his wishes and to not assume that "they will know what to do." He agreed to have a conversation with them. He declined any advanced directive paperwork today. This issue will need to be readdressed given his age.

## 2017-02-24 NOTE — Assessment & Plan Note (Signed)
Stable. Continue Simvastatin 10 mg daily.

## 2017-03-02 NOTE — Progress Notes (Signed)
Internal Medicine Clinic Attending  Case discussed with Dr. Rivet soon after the resident saw the patient.  We reviewed the resident's history and exam and pertinent patient test results.  I agree with the assessment, diagnosis, and plan of care documented in the resident's note.  

## 2017-04-06 ENCOUNTER — Encounter: Payer: Self-pay | Admitting: *Deleted

## 2017-04-19 ENCOUNTER — Other Ambulatory Visit: Payer: Self-pay | Admitting: Internal Medicine

## 2017-04-19 DIAGNOSIS — E559 Vitamin D deficiency, unspecified: Secondary | ICD-10-CM

## 2017-04-20 NOTE — Telephone Encounter (Signed)
Patient needs to have his vitamin d level rechecked. He can make an appointment in [facility] so this can be followed up. He can take over the counter vitamin D 1000 mg daily.

## 2017-05-21 ENCOUNTER — Other Ambulatory Visit: Payer: Self-pay | Admitting: Oncology

## 2017-07-20 ENCOUNTER — Encounter: Payer: Medicare Other | Admitting: Internal Medicine

## 2017-08-15 ENCOUNTER — Encounter (HOSPITAL_COMMUNITY): Payer: Self-pay | Admitting: Emergency Medicine

## 2017-08-15 DIAGNOSIS — Z9842 Cataract extraction status, left eye: Secondary | ICD-10-CM

## 2017-08-15 DIAGNOSIS — I130 Hypertensive heart and chronic kidney disease with heart failure and stage 1 through stage 4 chronic kidney disease, or unspecified chronic kidney disease: Secondary | ICD-10-CM | POA: Diagnosis not present

## 2017-08-15 DIAGNOSIS — N179 Acute kidney failure, unspecified: Secondary | ICD-10-CM | POA: Diagnosis present

## 2017-08-15 DIAGNOSIS — N183 Chronic kidney disease, stage 3 (moderate): Secondary | ICD-10-CM | POA: Diagnosis present

## 2017-08-15 DIAGNOSIS — J9601 Acute respiratory failure with hypoxia: Secondary | ICD-10-CM | POA: Diagnosis present

## 2017-08-15 DIAGNOSIS — Z8546 Personal history of malignant neoplasm of prostate: Secondary | ICD-10-CM

## 2017-08-15 DIAGNOSIS — Z9841 Cataract extraction status, right eye: Secondary | ICD-10-CM

## 2017-08-15 DIAGNOSIS — Z923 Personal history of irradiation: Secondary | ICD-10-CM

## 2017-08-15 DIAGNOSIS — T465X5A Adverse effect of other antihypertensive drugs, initial encounter: Secondary | ICD-10-CM | POA: Diagnosis not present

## 2017-08-15 DIAGNOSIS — E871 Hypo-osmolality and hyponatremia: Secondary | ICD-10-CM | POA: Diagnosis not present

## 2017-08-15 DIAGNOSIS — R0902 Hypoxemia: Secondary | ICD-10-CM | POA: Diagnosis not present

## 2017-08-15 DIAGNOSIS — M109 Gout, unspecified: Secondary | ICD-10-CM | POA: Diagnosis present

## 2017-08-15 DIAGNOSIS — I272 Pulmonary hypertension, unspecified: Secondary | ICD-10-CM | POA: Diagnosis present

## 2017-08-15 DIAGNOSIS — Z961 Presence of intraocular lens: Secondary | ICD-10-CM | POA: Diagnosis present

## 2017-08-15 DIAGNOSIS — Z79899 Other long term (current) drug therapy: Secondary | ICD-10-CM

## 2017-08-15 DIAGNOSIS — Z87891 Personal history of nicotine dependence: Secondary | ICD-10-CM

## 2017-08-15 DIAGNOSIS — R001 Bradycardia, unspecified: Secondary | ICD-10-CM | POA: Diagnosis not present

## 2017-08-15 DIAGNOSIS — E785 Hyperlipidemia, unspecified: Secondary | ICD-10-CM | POA: Diagnosis present

## 2017-08-15 DIAGNOSIS — I77811 Abdominal aortic ectasia: Secondary | ICD-10-CM | POA: Diagnosis present

## 2017-08-15 DIAGNOSIS — N132 Hydronephrosis with renal and ureteral calculous obstruction: Secondary | ICD-10-CM | POA: Diagnosis present

## 2017-08-15 DIAGNOSIS — I5043 Acute on chronic combined systolic (congestive) and diastolic (congestive) heart failure: Secondary | ICD-10-CM | POA: Diagnosis present

## 2017-08-15 DIAGNOSIS — D509 Iron deficiency anemia, unspecified: Secondary | ICD-10-CM | POA: Diagnosis present

## 2017-08-15 DIAGNOSIS — I700 Atherosclerosis of aorta: Secondary | ICD-10-CM | POA: Diagnosis present

## 2017-08-15 DIAGNOSIS — N2581 Secondary hyperparathyroidism of renal origin: Secondary | ICD-10-CM | POA: Diagnosis present

## 2017-08-15 MED ORDER — ALBUTEROL SULFATE (2.5 MG/3ML) 0.083% IN NEBU: 5.0000 mg | INHALATION_SOLUTION | Freq: Once | RESPIRATORY_TRACT | Status: DC

## 2017-08-15 MED ORDER — ALBUTEROL SULFATE (2.5 MG/3ML) 0.083% IN NEBU
INHALATION_SOLUTION | RESPIRATORY_TRACT | Status: AC
  Administered 2017-08-16: 5 mg
  Filled 2017-08-15: qty 6

## 2017-08-15 NOTE — ED Triage Notes (Signed)
Patient reports worsening SOB with chest congestion /wheezing onset yesterday , denies fever or chills , O2 Sat= 85% room air at arrival , O2 2 lpm/Landfall applied at triage improved to 100% .

## 2017-08-16 ENCOUNTER — Inpatient Hospital Stay (HOSPITAL_COMMUNITY)
Admission: EM | Admit: 2017-08-16 | Discharge: 2017-08-25 | DRG: 987 | Disposition: A | Discharge status: Home / Self Care | Payer: Medicare Other | Attending: Internal Medicine | Admitting: Internal Medicine

## 2017-08-16 ENCOUNTER — Inpatient Hospital Stay (HOSPITAL_COMMUNITY): Payer: Medicare Other

## 2017-08-16 ENCOUNTER — Emergency Department (HOSPITAL_COMMUNITY): Payer: Medicare Other

## 2017-08-16 DIAGNOSIS — R0902 Hypoxemia: Secondary | ICD-10-CM

## 2017-08-16 DIAGNOSIS — Z87891 Personal history of nicotine dependence: Secondary | ICD-10-CM | POA: Diagnosis not present

## 2017-08-16 DIAGNOSIS — Z9842 Cataract extraction status, left eye: Secondary | ICD-10-CM | POA: Diagnosis not present

## 2017-08-16 DIAGNOSIS — D649 Anemia, unspecified: Secondary | ICD-10-CM | POA: Diagnosis not present

## 2017-08-16 DIAGNOSIS — I10 Essential (primary) hypertension: Secondary | ICD-10-CM | POA: Diagnosis present

## 2017-08-16 DIAGNOSIS — Z9841 Cataract extraction status, right eye: Secondary | ICD-10-CM | POA: Diagnosis not present

## 2017-08-16 DIAGNOSIS — N132 Hydronephrosis with renal and ureteral calculous obstruction: Secondary | ICD-10-CM | POA: Diagnosis present

## 2017-08-16 DIAGNOSIS — N178 Other acute kidney failure: Secondary | ICD-10-CM | POA: Diagnosis not present

## 2017-08-16 DIAGNOSIS — N182 Chronic kidney disease, stage 2 (mild): Secondary | ICD-10-CM | POA: Diagnosis not present

## 2017-08-16 DIAGNOSIS — Z8546 Personal history of malignant neoplasm of prostate: Secondary | ICD-10-CM

## 2017-08-16 DIAGNOSIS — J181 Lobar pneumonia, unspecified organism: Secondary | ICD-10-CM

## 2017-08-16 DIAGNOSIS — N189 Chronic kidney disease, unspecified: Secondary | ICD-10-CM

## 2017-08-16 DIAGNOSIS — I340 Nonrheumatic mitral (valve) insufficiency: Secondary | ICD-10-CM

## 2017-08-16 DIAGNOSIS — I77811 Abdominal aortic ectasia: Secondary | ICD-10-CM | POA: Diagnosis present

## 2017-08-16 DIAGNOSIS — N139 Obstructive and reflux uropathy, unspecified: Secondary | ICD-10-CM | POA: Diagnosis not present

## 2017-08-16 DIAGNOSIS — I272 Pulmonary hypertension, unspecified: Secondary | ICD-10-CM | POA: Diagnosis present

## 2017-08-16 DIAGNOSIS — N19 Unspecified kidney failure: Secondary | ICD-10-CM | POA: Diagnosis not present

## 2017-08-16 DIAGNOSIS — Z923 Personal history of irradiation: Secondary | ICD-10-CM

## 2017-08-16 DIAGNOSIS — E871 Hypo-osmolality and hyponatremia: Secondary | ICD-10-CM | POA: Diagnosis not present

## 2017-08-16 DIAGNOSIS — E785 Hyperlipidemia, unspecified: Secondary | ICD-10-CM

## 2017-08-16 DIAGNOSIS — D509 Iron deficiency anemia, unspecified: Secondary | ICD-10-CM | POA: Diagnosis present

## 2017-08-16 DIAGNOSIS — R011 Cardiac murmur, unspecified: Secondary | ICD-10-CM | POA: Diagnosis not present

## 2017-08-16 DIAGNOSIS — Z961 Presence of intraocular lens: Secondary | ICD-10-CM | POA: Diagnosis present

## 2017-08-16 DIAGNOSIS — Z960 Presence of urogenital implants: Secondary | ICD-10-CM | POA: Diagnosis not present

## 2017-08-16 DIAGNOSIS — N2581 Secondary hyperparathyroidism of renal origin: Secondary | ICD-10-CM | POA: Clinically undetermined

## 2017-08-16 DIAGNOSIS — Z419 Encounter for procedure for purposes other than remedying health state, unspecified: Secondary | ICD-10-CM

## 2017-08-16 DIAGNOSIS — J9600 Acute respiratory failure, unspecified whether with hypoxia or hypercapnia: Secondary | ICD-10-CM

## 2017-08-16 DIAGNOSIS — I700 Atherosclerosis of aorta: Secondary | ICD-10-CM | POA: Diagnosis present

## 2017-08-16 DIAGNOSIS — I5022 Chronic systolic (congestive) heart failure: Secondary | ICD-10-CM | POA: Diagnosis present

## 2017-08-16 DIAGNOSIS — R001 Bradycardia, unspecified: Secondary | ICD-10-CM | POA: Diagnosis not present

## 2017-08-16 DIAGNOSIS — T465X5A Adverse effect of other antihypertensive drugs, initial encounter: Secondary | ICD-10-CM | POA: Diagnosis not present

## 2017-08-16 DIAGNOSIS — J189 Pneumonia, unspecified organism: Secondary | ICD-10-CM

## 2017-08-16 DIAGNOSIS — I5043 Acute on chronic combined systolic (congestive) and diastolic (congestive) heart failure: Secondary | ICD-10-CM | POA: Diagnosis present

## 2017-08-16 DIAGNOSIS — N179 Acute kidney failure, unspecified: Secondary | ICD-10-CM | POA: Diagnosis present

## 2017-08-16 DIAGNOSIS — T50905A Adverse effect of unspecified drugs, medicaments and biological substances, initial encounter: Secondary | ICD-10-CM

## 2017-08-16 DIAGNOSIS — M109 Gout, unspecified: Secondary | ICD-10-CM | POA: Diagnosis present

## 2017-08-16 DIAGNOSIS — Z8249 Family history of ischemic heart disease and other diseases of the circulatory system: Secondary | ICD-10-CM

## 2017-08-16 DIAGNOSIS — E8779 Other fluid overload: Secondary | ICD-10-CM | POA: Diagnosis not present

## 2017-08-16 DIAGNOSIS — E875 Hyperkalemia: Secondary | ICD-10-CM | POA: Diagnosis not present

## 2017-08-16 DIAGNOSIS — I130 Hypertensive heart and chronic kidney disease with heart failure and stage 1 through stage 4 chronic kidney disease, or unspecified chronic kidney disease: Secondary | ICD-10-CM | POA: Diagnosis present

## 2017-08-16 DIAGNOSIS — I5041 Acute combined systolic (congestive) and diastolic (congestive) heart failure: Secondary | ICD-10-CM

## 2017-08-16 DIAGNOSIS — N1330 Unspecified hydronephrosis: Secondary | ICD-10-CM | POA: Diagnosis not present

## 2017-08-16 DIAGNOSIS — I878 Other specified disorders of veins: Secondary | ICD-10-CM | POA: Diagnosis not present

## 2017-08-16 DIAGNOSIS — N183 Chronic kidney disease, stage 3 (moderate): Secondary | ICD-10-CM | POA: Diagnosis present

## 2017-08-16 DIAGNOSIS — J9601 Acute respiratory failure with hypoxia: Secondary | ICD-10-CM | POA: Diagnosis present

## 2017-08-16 DIAGNOSIS — Z79899 Other long term (current) drug therapy: Secondary | ICD-10-CM | POA: Diagnosis not present

## 2017-08-16 HISTORY — DX: Malignant neoplasm of prostate: C61

## 2017-08-16 HISTORY — DX: Personal history of other diseases of the musculoskeletal system and connective tissue: Z87.39

## 2017-08-16 HISTORY — DX: Heart failure, unspecified: I50.9

## 2017-08-16 HISTORY — DX: Cardiac murmur, unspecified: R01.1

## 2017-08-16 LAB — ECHOCARDIOGRAM COMPLETE
Ao-asc: 36 cm
Area-P 1/2: 3.61 cm2
CHL CUP DOP CALC LVOT VTI: 27.3 cm
CHL CUP MV DEC (S): 185
EWDT: 185 ms
FS: 14 % — AB (ref 28–44)
HEIGHTINCHES: 74 in
IV/PV OW: 1.5
LA ID, A-P, ES: 37 mm
LA diam end sys: 37 mm
LA vol A4C: 63 ml
LA vol: 78.9 mL
LADIAMINDEX: 1.77 cm/m2
LAVOLIN: 37.7 mL/m2
LV PW d: 10 mm — AB (ref 0.6–1.1)
LVOT area: 3.14 cm2
LVOT diameter: 20 mm
LVOT peak grad rest: 6 mmHg
LVOTPV: 121 cm/s
LVOTSV: 86 mL
MV VTI: 211 cm
MV pk A vel: 132 m/s
MV pk E vel: 60.1 m/s
P 1/2 time: 54 ms
PISA EROA: 0.07 cm2
RV TAPSE: 29.5 mm
Reg peak vel: 334 cm/s
TR max vel: 334 cm/s
WEIGHTICAEL: 2960 [oz_av]

## 2017-08-16 LAB — COMPREHENSIVE METABOLIC PANEL
ALT: 11 U/L — ABNORMAL LOW (ref 17–63)
ANION GAP: 8 (ref 5–15)
AST: 17 U/L (ref 15–41)
Albumin: 3.6 g/dL (ref 3.5–5.0)
Alkaline Phosphatase: 70 U/L (ref 38–126)
BILIRUBIN TOTAL: 0.7 mg/dL (ref 0.3–1.2)
BUN: 27 mg/dL — ABNORMAL HIGH (ref 6–20)
CHLORIDE: 105 mmol/L (ref 101–111)
CO2: 22 mmol/L (ref 22–32)
Calcium: 8.7 mg/dL — ABNORMAL LOW (ref 8.9–10.3)
Creatinine, Ser: 2.92 mg/dL — ABNORMAL HIGH (ref 0.61–1.24)
GFR, EST AFRICAN AMERICAN: 21 mL/min — AB (ref >60)
GFR, EST NON AFRICAN AMERICAN: 18 mL/min — AB (ref >60)
Glucose, Bld: 137 mg/dL — ABNORMAL HIGH (ref 65–99)
POTASSIUM: 4.3 mmol/L (ref 3.5–5.1)
Sodium: 135 mmol/L (ref 135–145)
TOTAL PROTEIN: 8 g/dL (ref 6.5–8.1)

## 2017-08-16 LAB — CBC WITH DIFFERENTIAL/PLATELET
BASOS ABS: 0 10*3/uL (ref 0.0–0.1)
BASOS PCT: 1 %
EOS PCT: 1 %
Eosinophils Absolute: 0.1 10*3/uL (ref 0.0–0.7)
HCT: 35.5 % — ABNORMAL LOW (ref 39.0–52.0)
Hemoglobin: 11.6 g/dL — ABNORMAL LOW (ref 13.0–17.0)
LYMPHS PCT: 7 %
Lymphs Abs: 0.6 10*3/uL — ABNORMAL LOW (ref 0.7–4.0)
MCH: 29.8 pg (ref 26.0–34.0)
MCHC: 32.7 g/dL (ref 30.0–36.0)
MCV: 91.3 fL (ref 78.0–100.0)
Monocytes Absolute: 0.8 10*3/uL (ref 0.1–1.0)
Monocytes Relative: 9 %
NEUTROS ABS: 7.2 10*3/uL (ref 1.7–7.7)
Neutrophils Relative %: 82 %
PLATELETS: 202 10*3/uL (ref 150–400)
RBC: 3.89 MIL/uL — AB (ref 4.22–5.81)
RDW: 12.2 % (ref 11.5–15.5)
WBC: 8.7 10*3/uL (ref 4.0–10.5)

## 2017-08-16 LAB — I-STAT ARTERIAL BLOOD GAS, ED
Acid-Base Excess: 1 mmol/L (ref 0.0–2.0)
Bicarbonate: 24.9 mmol/L (ref 20.0–28.0)
O2 Saturation: 96 %
PCO2 ART: 34.1 mmHg (ref 32.0–48.0)
PH ART: 7.47 — AB (ref 7.350–7.450)
TCO2: 26 mmol/L (ref 22–32)
pO2, Arterial: 78 mmHg — ABNORMAL LOW (ref 83.0–108.0)

## 2017-08-16 LAB — I-STAT TROPONIN, ED
TROPONIN I, POC: 0.01 ng/mL (ref 0.00–0.08)
Troponin i, poc: 0 ng/mL (ref 0.00–0.08)

## 2017-08-16 LAB — BASIC METABOLIC PANEL
Anion gap: 10 (ref 5–15)
BUN: 25 mg/dL — ABNORMAL HIGH (ref 6–20)
CHLORIDE: 102 mmol/L (ref 101–111)
CO2: 25 mmol/L (ref 22–32)
Calcium: 8.4 mg/dL — ABNORMAL LOW (ref 8.9–10.3)
Creatinine, Ser: 2.79 mg/dL — ABNORMAL HIGH (ref 0.61–1.24)
GFR calc non Af Amer: 19 mL/min — ABNORMAL LOW (ref >60)
GFR, EST AFRICAN AMERICAN: 22 mL/min — AB (ref >60)
Glucose, Bld: 95 mg/dL (ref 65–99)
POTASSIUM: 4.2 mmol/L (ref 3.5–5.1)
SODIUM: 137 mmol/L (ref 135–145)

## 2017-08-16 LAB — D-DIMER, QUANTITATIVE (NOT AT ARMC): D DIMER QUANT: 1.23 ug{FEU}/mL — AB (ref 0.00–0.50)

## 2017-08-16 LAB — BRAIN NATRIURETIC PEPTIDE: B NATRIURETIC PEPTIDE 5: 1326.9 pg/mL — AB (ref 0.0–100.0)

## 2017-08-16 LAB — I-STAT CG4 LACTIC ACID, ED: LACTIC ACID, VENOUS: 1 mmol/L (ref 0.5–1.9)

## 2017-08-16 MED ORDER — TIMOLOL MALEATE 0.5 % OP SOLN
1.0000 [drp] | Freq: Every day | OPHTHALMIC | Status: DC
  Administered 2017-08-16 – 2017-08-25 (×10): 1 [drp] via OPHTHALMIC
  Filled 2017-08-16: qty 5

## 2017-08-16 MED ORDER — ASPIRIN EC 81 MG PO TBEC
81.0000 mg | DELAYED_RELEASE_TABLET | Freq: Every day | ORAL | Status: DC
  Administered 2017-08-16 – 2017-08-25 (×10): 81 mg via ORAL
  Filled 2017-08-16 (×10): qty 1

## 2017-08-16 MED ORDER — BRIMONIDINE TARTRATE 0.2 % OP SOLN
1.0000 [drp] | Freq: Two times a day (BID) | OPHTHALMIC | Status: DC
  Administered 2017-08-16 – 2017-08-25 (×19): 1 [drp] via OPHTHALMIC
  Filled 2017-08-16 (×2): qty 5

## 2017-08-16 MED ORDER — HYDRALAZINE HCL 10 MG PO TABS
10.0000 mg | ORAL_TABLET | Freq: Four times a day (QID) | ORAL | Status: DC
  Administered 2017-08-16: 10 mg via ORAL
  Filled 2017-08-16: qty 1

## 2017-08-16 MED ORDER — LATANOPROST 0.005 % OP SOLN
1.0000 [drp] | Freq: Every day | OPHTHALMIC | Status: DC
  Administered 2017-08-16 – 2017-08-24 (×9): 1 [drp] via OPHTHALMIC
  Filled 2017-08-16: qty 2.5

## 2017-08-16 MED ORDER — AZITHROMYCIN 500 MG IV SOLR
500.0000 mg | Freq: Once | INTRAVENOUS | Status: DC
  Administered 2017-08-16: 500 mg via INTRAVENOUS
  Filled 2017-08-16: qty 500

## 2017-08-16 MED ORDER — SODIUM CHLORIDE 0.9 % IV BOLUS (SEPSIS)
500.0000 mL | Freq: Once | INTRAVENOUS | Status: AC
  Administered 2017-08-16: 500 mL via INTRAVENOUS

## 2017-08-16 MED ORDER — SODIUM CHLORIDE 0.9 % IV SOLN: 250.0000 mL | INTRAVENOUS | Status: DC | PRN

## 2017-08-16 MED ORDER — HYDRALAZINE HCL 10 MG PO TABS
10.0000 mg | ORAL_TABLET | Freq: Four times a day (QID) | ORAL | Status: DC | PRN
  Administered 2017-08-16 – 2017-08-18 (×3): 10 mg via ORAL
  Filled 2017-08-16 (×3): qty 1

## 2017-08-16 MED ORDER — FUROSEMIDE 10 MG/ML IJ SOLN: 20.0000 mg | Freq: Once | INTRAMUSCULAR | Status: DC

## 2017-08-16 MED ORDER — SODIUM CHLORIDE 0.9% FLUSH
3.0000 mL | Freq: Two times a day (BID) | INTRAVENOUS | Status: DC
  Administered 2017-08-16 – 2017-08-25 (×19): 3 mL via INTRAVENOUS

## 2017-08-16 MED ORDER — HEPARIN SODIUM (PORCINE) 5000 UNIT/ML IJ SOLN
5000.0000 [IU] | Freq: Three times a day (TID) | INTRAMUSCULAR | Status: DC
  Administered 2017-08-16 – 2017-08-25 (×27): 5000 [IU] via SUBCUTANEOUS
  Filled 2017-08-16 (×24): qty 1

## 2017-08-16 MED ORDER — DEXTROSE 5 % IV SOLN
1.0000 g | Freq: Once | INTRAVENOUS | Status: AC
  Administered 2017-08-16: 1 g via INTRAVENOUS
  Filled 2017-08-16: qty 10

## 2017-08-16 MED ORDER — ACETAMINOPHEN 325 MG PO TABS
650.0000 mg | ORAL_TABLET | ORAL | Status: DC | PRN
Start: 2017-08-16 — End: 2017-08-25

## 2017-08-16 MED ORDER — FUROSEMIDE 10 MG/ML IJ SOLN
40.0000 mg | Freq: Once | INTRAMUSCULAR | Status: AC
  Administered 2017-08-16: 40 mg via INTRAVENOUS
  Filled 2017-08-16: qty 4

## 2017-08-16 MED ORDER — ONDANSETRON HCL 4 MG/2ML IJ SOLN: 4.0000 mg | Freq: Four times a day (QID) | INTRAMUSCULAR | Status: DC | PRN

## 2017-08-16 MED ORDER — FUROSEMIDE 10 MG/ML IJ SOLN
40.0000 mg | Freq: Two times a day (BID) | INTRAMUSCULAR | Status: DC
  Administered 2017-08-16 – 2017-08-17 (×2): 40 mg via INTRAVENOUS
  Filled 2017-08-16 (×2): qty 4

## 2017-08-16 MED ORDER — SODIUM CHLORIDE 0.9% FLUSH
3.0000 mL | INTRAVENOUS | Status: DC | PRN
  Administered 2017-08-16 (×2): 3 mL via INTRAVENOUS
  Filled 2017-08-16 (×2): qty 3

## 2017-08-16 NOTE — Progress Notes (Addendum)
   Subjective:  Patient seen and examined. Patient endorses shortness of breath x 2 days, which he has never experienced before. He typically sleeps with 2-3 pillows at night, but this is something he has done for a while. He states his breathing is much improved this morning after receiving one dose of lasix. He has no other acute complaints at this time.   Objective:  Vital signs in last 24 hours: Vitals:   08/16/17 0121 08/16/17 0300 08/16/17 0354 08/16/17 0817  BP: (!) 189/91 (!) 188/93 (!) 178/90 (!) 188/87  Pulse: 93 89 96 79  Resp: (!) 21 11 19 19   Temp:   98.1 F (36.7 C) 98.2 F (36.8 C)  TempSrc:   Oral Oral  SpO2: 93% 93% 93% 96%  Weight:   185 lb (83.9 kg)   Height:   6\' 2"  (1.88 m)    General: Laying in bed comfortably, NAD HEENT: [place]/AT, On nasal cannula, EOMI, no scleral icterus,  Cardiac: RRR, Systolic murmur best appreciated at apex Pulm: normal effort, bibasilar crackles R>L Abd: soft, non tender, non distended, BS normal Ext: extremities well perfused, no peripheral edema Neuro: alert and oriented X3, cranial nerves II-XII grossly intact   Assessment/Plan:  Active Problems:   Acute respiratory failure with hypoxia (HCC)  Acute Heart Failure Exacerbation  Acute respiratory failure with hypoxia Patient responded to IV lasix well. Volume overload improved as peripheral edema has resolved, but still with bibasilar crackles. ECHO revealed LVEF 35%, septal-lateral dyssynchrony suggestive of LBBB/IVCD, diffuse hypokinesis, LVH, G1DD, mild mitral valve regurg, and mild pulm hypertension. Repeat CXR with vascular congestion and perihilar airspace opacities consistent with pulm edema. ECHO and CXR confirm heart failure and pulmonary edema, supported by clinical improvement after IV lasix. Pneumonia unlikely- will not restart antibiotics.  -IV Lasix 40 mg BID  - Continue O2 as needed, titrate as tolerated - Ambulatory pulse ox  - Daily weights, I/Os - telemetry -  Holding lisinopril and nifedipine - BMET in AM  AKI on CKD Likely cardiorenal secondary to heart failure. Cr 2.79 today.  - Holding Lisinopril - BMET in AM  HTN Consistently hypertensive 329-518 systolic, 84-16. Will start hydralazine in setting on new onset HF to manage BP as inpatient.  -Hydralazine 10 mg q6, titrate up as needed    Dispo: Anticipated discharge in approximately 1-2 day(s).   Melanee Spry, MD 08/16/2017, 11:58 AM Pager: [phone]

## 2017-08-16 NOTE — Progress Notes (Signed)
Patient [name] [name] oxygen saturation on 2L is 95%.  Ambulation after 1 minute on RA was 88%. RA at 3 minutes of ambulation 83%.  Sats quickly rebounded to 95% when placed back on oxygen at 2L.

## 2017-08-16 NOTE — ED Notes (Signed)
Patient in X-Ray.

## 2017-08-16 NOTE — ED Notes (Signed)
Attempted to give report X 1 

## 2017-08-16 NOTE — Progress Notes (Signed)
  Echocardiogram 2D Echocardiogram has been performed.  Brookley Spitler G Jarret Torre 08/16/2017, 10:26 AM

## 2017-08-16 NOTE — H&P (Signed)
Date: 08/16/2017               Patient Name:  [name] [name] MRN: [id number]  DOB: 1930/06/05 Age / Sex: 83 y.o., male   PCP: Tawny Asal, MD         Medical Service: Internal Medicine Teaching Service         Attending Physician: Dr. Lucious Groves, DO    First Contact: Dr. Rochele Pages Pager: [phone]  Second Contact: Dr. Alphonzo Grieve Pager: [phone]       After Hours (After 5p/  First Contact Pager: [phone]  weekends / holidays): Second Contact Pager: [phone]   Chief Complaint: shortness of breath  History of Present Illness:  Mr. Zelman is an 83yo male with PMH significant for HTN, HLD, CKD, and hx of prostate cancer s/p radiation who presents with shortness of breath on exertion for the last couple months that has worsened in the last few days. Was not doing anything in particular when the shortness of breath started. He also endorses shortness of breath at rest but less so than with exertion. States his breathing is worse when laying down compared to sitting up. Endorses chest congestion. Denies PND, chest pain, cough, fevers/chills, abdominal pain, N/V, or leg swelling. Denies dysuria or hematuria. Denies sick contacts, recent travel or surgery.  Denies history of COPD or heart failure. Quit smoking in 1998 and quit alcohol use in 1997. Has been told he has a heart murmur and used to be on "a water pill" but no longer is. Per chart review, was on HCTZ for BP control but did not tolerate it. Takes his BP at home - reports systolics are usually in the 150s-160s.  Had an echo in 2008 which showed LVEF 55-65%, hypokinesis of basal-mid posterior wall, and mildly increased LV wall thickness. Mild MR and LA mildly dilated. PA peak pressure mild to moderately increased.  ED Course - BP 187/123, HR 107, RR 20, temp 98.2, Hypoxic to 85% on arrival, improved to 95% with supplemental O2. Patient reports his shortness of breath improved with supplemental O2 as well. -  BNP 1326. ABG 7.47/34/78. Cr 2.92. Troponin negative. No leukocytosis. Lactic acid normal. D-dimer elevated at 1.23. Blood cultures drawn. - CXR with bilateral patchy mid-lung and basilar infiltrates. Initial concern for PNA vs new heart failure. - Azithromycin and ceftriaxone started. Received 537mL NS bolus.  Meds:  Current Meds  Medication Sig  . brimonidine (ALPHAGAN) 0.2 % ophthalmic solution Place 1 drop into both eyes 2 (two) times daily.  Marland Kitchen latanoprost (XALATAN) 0.005 % ophthalmic solution Place 1 drop into both eyes at bedtime.   Marland Kitchen lisinopril (PRINIVIL,ZESTRIL) 40 MG tablet TAKE 1 TABLET (40 MG TOTAL) BY MOUTH DAILY.  Marland Kitchen NIFEdipine (PROCARDIA XL/ADALAT-CC) 90 MG 24 hr tablet TAKE 1 TABLET (90 MG TOTAL) BY MOUTH DAILY.  . simvastatin (ZOCOR) 10 MG tablet TAKE 1 TABLET (10 MG TOTAL) BY MOUTH DAILY AT 6 PM.  . timolol (TIMOPTIC) 0.5 % ophthalmic solution Place 1 drop into both eyes daily.   Allergies: Allergies as of 08/15/2017  . (No Known Allergies)   Past Medical History:  Diagnosis Date  . H/O prostate cancer    about 1 3 years ago, s/p radiation  . Hyperlipidemia   . Hypertension     Family History:  Family History  Problem Relation Age of Onset  . Hypertension Father   . Hypertension Sister   . Hypertension Brother    Social History:  Social History   Social History  . Marital status: Married    Spouse name: N/A  . Number of children: N/A  . Years of education: N/A   Social History Main Topics  . Smoking status: Former Smoker    Packs/day: 1.00    Years: 25.00  . Smokeless tobacco: Never Used     Comment: quit at 19 years ago  . Alcohol use No  . Drug use: No  . Sexual activity: Not Asked   Other Topics Concern  . None   Social History Narrative  . None   Review of Systems: Constitutional: Negative for diaphoresis, fever, malaise/fatigue, and weight loss. HEENT: Negative for blurred vision, hearing loss, sinus pain, and sore throat. Positive for  congestion. Respiratory: Negative for cough. Positive for shortness of breath and wheezing. Cardiovascular: Negative for chest pain, palpitations, orthopnea, PND, and leg swelling. Gastrointestinal: Negative for abdominal pain, blood in stool, constipation, diarrhea, heartburn, nausea and vomiting. Genitourinary: Negative for dysuria and hematuria. Musculoskeletal: Negative for joint pain and myalgias. Neurological: Negative for dizziness, focal weakness, weakness and headaches.  Physical Exam: Blood pressure (!) 189/91, pulse 93, temperature 98.2 F (36.8 C), temperature source Oral, resp. rate (!) 21, height 6\' 2"  (1.88 m), weight 195 lb (88.5 kg), SpO2 93 %. GEN: Well-appearing, well-nourished, elderly gentleman sitting up in bed in NAD. Alert and oriented. On 2L [place]. HENT: [place]/AT. Moist mucous membranes. No visible lesions. EYES: Sclera non-icteric. Conjunctiva clear. RESP: Bibasilar crackles. No wheezes, rales, or rhonchi. No increased work of breathing. CV: Normal rate and regular rhythm. S3 gallop. Diastolic murmur heard best at apex. No carotid bruits. JVD elevated to bottom of earlobe. 1+ pitting LE edema. ABD: Soft. Non-tender. Non-distended. Normoactive bowel sounds. EXT: 1+ pitting LE edema, L>R. Warm and well perfused. NEURO: Cranial nerves II-XII grossly intact. Able to lift all four extremities against gravity. No apparent audiovisual hallucinations. Speech fluent and appropriate.  Labs CBC Latest Ref Rng & Units 08/16/2017 02/23/2017 10/27/2012  WBC 4.0 - 10.5 K/uL 8.7 4.6 5.1  Hemoglobin 13.0 - 17.0 g/dL 11.6(L) 12.7(L) 14.8  Hematocrit 39.0 - 52.0 % 35.5(L) 37.5 42.8  Platelets 150 - 400 K/uL 202 268 266   CMP Latest Ref Rng & Units 08/16/2017 02/23/2017 07/30/2015  Glucose 65 - 99 mg/dL 137(H) 79 90  BUN 6 - 20 mg/dL 27(H) 26 15  Creatinine 0.61 - 1.24 mg/dL 2.92(H) 2.06(H) 1.40(H)  Sodium 135 - 145 mmol/L 135 140 141  Potassium 3.5 - 5.1 mmol/L 4.3 4.2 3.9  Chloride 101 -  111 mmol/L 105 100 99  CO2 22 - 32 mmol/L 22 25 21   Calcium 8.9 - 10.3 mg/dL 8.7(L) 8.9 9.3  Total Protein 6.5 - 8.1 g/dL 8.0 7.0 -  Total Bilirubin 0.3 - 1.2 mg/dL 0.7 0.3 -  Alkaline Phos 38 - 126 U/L 70 62 -  AST 15 - 41 U/L 17 17 -  ALT 17 - 63 U/L 11(L) 14 -   Troponin neg x2 BNP 1326 Lactic acid 1.00 ABG 7.47/34.1/78.0 D-Dimer 1.23 BCx pending  EKG: 1st degree AV block. LVH CXR: Midlung and bibasilar infiltrates, ?Kerley B lines POCUS: Positive for B lines.  Assessment & Plan by Problem: Active Problems:   Acute respiratory failure with hypoxia [facility])  Mr. Anaya is an 83yo male with PMH significant for HTN, HLD, CKD, and hx of prostate cancer s/p radiation who presents with worsening DOE, orthopnea, and signs of volume overload on exam and imaging. Found to be  hypoxic to 85%, which improved to 95% after supplemental O2.  Acute respiratory failure with hypoxia Patient presents with DOE and orthopnea x2d. Bibasilar crackles, S3 gallop, LE edema, and JVD elevation on exam. BNP >1300 on admission. Cr 2.92 (baseline ~1.4-1.5). ABG with primary respiratory alkalosis. CXR with what appears to be Kerley B lines and pulmonary edema, and POCUS positive for B lines. Most likely diagnosis is pulmonary edema 2/2 new onset heart failure. Differential includes pneumonia and PE, both of which I think are less likely. There was initial concern for pneumonia based on imaging, so patient received 1 dose of azithromycin and ceftriaxone in the ED. D-dimer mildly elevated, however Well's score negative and D-dimer may also be elevated in heart failure or chronic renal insufficiency.  - Repeat EKG in AM - Echo - IV lasix 40mg  - Continue O2 as needed - BMP in AM - Daily weights - Will dc rocephin and azithromycin - telemetry - Hold lisinopril and nifedipine - F/u BCx  AKI on CKD Cr 2.92 on admission, baseline ~1.5-2.0. Likely cardiorenal secondary to heart failure. - IV Lasix 40mg  - Hold  lisinopril - BMP in AM  Heart murmur ?Diastolic murmur on exam. Patient was told he had a murmur in the past. Echo in 2008 showed LVEF 55-65%, hypokinesis of basal-mid posterior wall, and mildly increased LV wall thickness. Mild MR and LA mildly dilated. PA peak pressure mild to moderately increased. - Echo  HTN Home regimen includes lisinopril 40mg  daily and nifedipine 90mg  daily - Will hold home medications, given AKI and concern for new heart failure  Diet: HH VTE PPx: SQH Dispo: Admit patient to Inpatient with expected length of stay greater than 2 midnights.  Signed: Colbert Ewing, MD 08/16/2017, 2:51 AM

## 2017-08-16 NOTE — ED Provider Notes (Signed)
[facility] EMERGENCY DEPARTMENT Provider Note   CSN: [id number] Arrival date & time: 08/15/17  2343     History   Chief Complaint Chief Complaint  Patient presents with  . Shortness of Breath    HPI [name] [name] is a 83 y.o. male.  Patient previous smoker presenting with a 2-day history of difficulty breathing, chest congestion and wheezing.  States he is coughing but not able to get anything up.  Has not smoked for 20 years.  Denies any history of COPD or asthma.  Got a flu shot about 3 weeks ago and has not felt well.  Denies chest pain or fever.  Hypoxic on arrival and 85%.  Denies any leg pain or leg swelling.  No history of heart failure.  Reports his only medical problems high blood pressure.  Denies any sick contacts.  Good p.o. intake and urine output.   The history is provided by the patient.  Shortness of Breath  Associated symptoms include cough. Pertinent negatives include no fever, no headaches, no chest pain, no vomiting, no abdominal pain, no rash and no leg swelling.    Past Medical History:  Diagnosis Date  . H/O prostate cancer    about 1 3 years ago, s/p radiation  . Hyperlipidemia   . Hypertension     Patient Active Problem List   Diagnosis Date Noted  . Weight loss 02/24/2017  . Vitamin D deficiency 02/24/2017  . Advanced care planning/counseling discussion 02/24/2017  . CKD (chronic kidney disease), stage III ([place]) 04/14/2013  . Essential hypertension 10/27/2012  . Hyperlipidemia 10/27/2012  . History of prostate cancer 10/27/2012  . Preventative health care 10/27/2012    History reviewed. No pertinent surgical history.     Home Medications    Prior to Admission medications   Medication Sig Start Date End Date Taking? Authorizing Provider  Cholecalciferol 50000 units TABS Take 50,000 Units by mouth once a week. 02/24/17   Rivet, Sindy Guadeloupe, MD  diclofenac sodium (VOLTAREN) 1 % GEL Apply 2 g topically 4 (four) times  daily. 07/30/15   Rivet, Sindy Guadeloupe, MD  dorzolamide (TRUSOPT) 2 % ophthalmic solution 1 drop 3 (three) times daily.    [provider]  latanoprost (XALATAN) 0.005 % ophthalmic solution 1 drop at bedtime.    [provider]  lisinopril (PRINIVIL,ZESTRIL) 40 MG tablet TAKE 1 TABLET (40 MG TOTAL) BY MOUTH DAILY. 05/21/17   Tawny Asal, MD  NIFEdipine (PROCARDIA XL/ADALAT-CC) 90 MG 24 hr tablet TAKE 1 TABLET (90 MG TOTAL) BY MOUTH DAILY. 02/22/17   Rivet, Sindy Guadeloupe, MD  simvastatin (ZOCOR) 10 MG tablet TAKE 1 TABLET (10 MG TOTAL) BY MOUTH DAILY AT 6 PM. 02/04/17   Rivet, Sindy Guadeloupe, MD    Family History Family History  Problem Relation Age of Onset  . Hypertension Father   . Hypertension Sister   . Hypertension Brother     Social History Social History  Substance Use Topics  . Smoking status: Former Smoker    Packs/day: 1.00    Years: 25.00  . Smokeless tobacco: Never Used     Comment: quit at 19 years ago  . Alcohol use No     Allergies   Patient has no known allergies.   Review of Systems Review of Systems  Constitutional: Negative for activity change, appetite change and fever.  HENT: Positive for congestion. Negative for sinus pressure.   Respiratory: Positive for cough and shortness of breath. Negative for chest tightness.  Cardiovascular: Negative for chest pain and leg swelling.  Gastrointestinal: Negative for abdominal pain, nausea and vomiting.  Genitourinary: Negative for dysuria, hematuria and testicular pain.  Musculoskeletal: Negative for arthralgias.  Skin: Negative for rash.  Neurological: Negative for dizziness, weakness, light-headedness and headaches.   all other systems are negative except as noted in the HPI and PMH.     Physical Exam Updated Vital Signs BP (!) 187/123 (BP Location: Right Arm)   Pulse (!) 107   Temp 98.2 F (36.8 C) (Oral)   Resp 20   Ht 6\' 2"  (1.88 m)   Wt 88.5 kg (195 lb)   SpO2 (!) 85%   BMI 25.04 kg/m    Physical Exam  Constitutional: He is oriented to person, place, and time. He appears well-developed and well-nourished. No distress.  Speaking in short sentences  HENT:  Head: Normocephalic and atraumatic.  Mouth/Throat: Oropharynx is clear and moist. No oropharyngeal exudate.  Eyes: Pupils are equal, round, and reactive to light. Conjunctivae and EOM are normal.  Neck: Normal range of motion. Neck supple.  No meningismus.  Cardiovascular: Normal rate, regular rhythm, normal heart sounds and intact distal pulses.   No murmur heard. Pulmonary/Chest: Effort normal. No respiratory distress. He has rales.  Bibasilar crackles  Abdominal: Soft. There is no tenderness. There is no rebound and no guarding.  Musculoskeletal: Normal range of motion. He exhibits edema. He exhibits no tenderness.  Trace pedal edema  Neurological: He is alert and oriented to person, place, and time. No cranial nerve deficit. He exhibits normal muscle tone. Coordination normal.   5/5 strength throughout. CN 2-12 intact.Equal grip strength.   Skin: Skin is warm.  Psychiatric: He has a normal mood and affect. His behavior is normal.  Nursing note and vitals reviewed.    ED Treatments / Results  Labs (all labs ordered are listed, but only abnormal results are displayed) Labs Reviewed  BRAIN NATRIURETIC PEPTIDE - Abnormal; Notable for the following:       Result Value   B Natriuretic Peptide 1,326.9 (*)    All other components within normal limits  CBC WITH DIFFERENTIAL/PLATELET - Abnormal; Notable for the following:    RBC 3.89 (*)    Hemoglobin 11.6 (*)    HCT 35.5 (*)    Lymphs Abs 0.6 (*)    All other components within normal limits  COMPREHENSIVE METABOLIC PANEL - Abnormal; Notable for the following:    Glucose, Bld 137 (*)    BUN 27 (*)    Creatinine, Ser 2.92 (*)    Calcium 8.7 (*)    ALT 11 (*)    GFR calc non Af Amer 18 (*)    GFR calc Af Amer 21 (*)    All other components within normal  limits  I-STAT ARTERIAL BLOOD GAS, ED - Abnormal; Notable for the following:    pH, Arterial 7.470 (*)    pO2, Arterial 78.0 (*)    All other components within normal limits  CULTURE, BLOOD (ROUTINE X 2)  CULTURE, BLOOD (ROUTINE X 2)  D-DIMER, QUANTITATIVE (NOT AT [facility])  I-STAT TROPONIN, ED  I-STAT TROPONIN, ED  I-STAT CG4 LACTIC ACID, ED  I-STAT CG4 LACTIC ACID, ED    EKG  EKG Interpretation  Date/Time:  Monday August 16 2017 00:06:03 EDT Ventricular Rate:  95 PR Interval:  212 QRS Duration: 112 QT Interval:  394 QTC Calculation: 495 R Axis:   82 Text Interpretation:  Sinus rhythm with 1st degree A-V block with  occasional Premature ventricular complexes Left ventricular hypertrophy with repolarization abnormality Cannot rule out Septal infarct , age undetermined Abnormal ECG No significant change was found Confirmed by Ezequiel Essex [phone]) on 08/16/2017 12:41:19 AM       Radiology Dg Chest Portable 1 View  Result Date: 08/16/2017 CLINICAL DATA:  Acute onset of shortness of breath. Initial encounter. EXAM: PORTABLE CHEST 1 VIEW COMPARISON:  None. FINDINGS: The lungs are well-aerated. Patchy bibasilar and mid lung airspace opacification raises concern for pneumonia. There is elevation of the left hemidiaphragm. There is no evidence of pleural effusion or pneumothorax. The cardiomediastinal silhouette is borderline normal in size. No acute osseous abnormalities are seen. IMPRESSION: Patchy bibasilar and midlung airspace opacification raises concern for pneumonia. Elevation of the left hemidiaphragm. Electronically Signed   By: Garald Balding M.D.   On: 08/16/2017 01:00    Procedures Procedures (including critical care time)  Medications Ordered in ED Medications  albuterol (PROVENTIL) (2.5 MG/3ML) 0.083% nebulizer solution (5 mg  Given 08/16/17 0000)     Initial Impression / Assessment and Plan / ED Course  I have reviewed the triage vital signs and the nursing  notes.  Pertinent labs & imaging results that were available during my care of the patient were reviewed by me and considered in my medical decision making (see chart for details).    2-day history of shortness of breath with nonproductive cough.  No chest pain.  Hypoxic on arrival with crackles.  Patient placed on supplemental oxygen.  EKG shows LVH with left bundle branch block similar to 2010.  Denies chest pain.  Xray with basilar infiltrate.  We will treat for pneumonia.  Cannot rule out heart failure.  Patient however does appear to be mildly clinically volume overloaded.  Patient given antibiotics and IV fluid. Creatinine appears elevated from baseline.  With new hypoxia and AKI, plan admission.   Plan admission for new onset heart failure with possible pneumonia as well.  No hypoxia on supplemental O2.  No chest pain.  No cough or fever.  Admission Discussed with internal medicine residents Final Clinical Impressions(s) / ED Diagnoses   Final diagnoses:  Community acquired pneumonia of left lower lobe of lung ([place])  Hypoxia    New Prescriptions New Prescriptions   No medications on file     Ezequiel Essex, MD 08/16/17 [phone]

## 2017-08-16 NOTE — Progress Notes (Signed)
New Admission Note:   Arrival Method: Arrived from ED via stretcher Mental Orientation: Alert and oriented x4 Telemetry: Box #29 Assessment: Completed Skin: See docflowsheet IV: NSL-Rt AC Pain: Denies Tubes: N/A Safety Measures: Safety Fall Prevention Plan has been given, discussed. Admission: Completed [facility] [place] Orientation: Patient has been orientated to the room, unit and staff.  Family: None at bedside  Orders have been reviewed and implemented. Will continue to monitor the patient. Call light has been placed within reach and bed alarm has been activated.   [employer], RN-BC Phone number: [phone]

## 2017-08-17 ENCOUNTER — Encounter (HOSPITAL_COMMUNITY): Payer: Self-pay | Admitting: General Practice

## 2017-08-17 DIAGNOSIS — I5022 Chronic systolic (congestive) heart failure: Secondary | ICD-10-CM | POA: Diagnosis present

## 2017-08-17 LAB — CBC
HEMATOCRIT: 32.6 % — AB (ref 39.0–52.0)
HEMOGLOBIN: 10.4 g/dL — AB (ref 13.0–17.0)
MCH: 29.5 pg (ref 26.0–34.0)
MCHC: 31.9 g/dL (ref 30.0–36.0)
MCV: 92.4 fL (ref 78.0–100.0)
Platelets: 166 10*3/uL (ref 150–400)
RBC: 3.53 MIL/uL — ABNORMAL LOW (ref 4.22–5.81)
RDW: 12.4 % (ref 11.5–15.5)
WBC: 4.9 10*3/uL (ref 4.0–10.5)

## 2017-08-17 LAB — BASIC METABOLIC PANEL
ANION GAP: 10 (ref 5–15)
BUN: 29 mg/dL — ABNORMAL HIGH (ref 6–20)
CHLORIDE: 101 mmol/L (ref 101–111)
CO2: 27 mmol/L (ref 22–32)
Calcium: 8.8 mg/dL — ABNORMAL LOW (ref 8.9–10.3)
Creatinine, Ser: 3.15 mg/dL — ABNORMAL HIGH (ref 0.61–1.24)
GFR calc Af Amer: 19 mL/min — ABNORMAL LOW (ref >60)
GFR, EST NON AFRICAN AMERICAN: 17 mL/min — AB (ref >60)
Glucose, Bld: 92 mg/dL (ref 65–99)
POTASSIUM: 3.9 mmol/L (ref 3.5–5.1)
SODIUM: 138 mmol/L (ref 135–145)

## 2017-08-17 MED ORDER — METOPROLOL SUCCINATE ER 50 MG PO TB24
50.0000 mg | ORAL_TABLET | Freq: Every day | ORAL | Status: DC
  Administered 2017-08-17 – 2017-08-19 (×3): 50 mg via ORAL
  Filled 2017-08-17 (×3): qty 1

## 2017-08-17 NOTE — Progress Notes (Signed)
Internal Medicine Attending:   I saw and examined the patient. I reviewed the resident's note and I agree with the resident's findings and plan as documented in the resident's note. Reports feels much better, nurse able to ambulate but still desaturates without supplemental O2.  BP remains very elevated.SCr initially improved but trending up.  On exam continues to have some right base crackles, no JVD, heart RRR with 2/6 systolic murmur.  No peripheral edema A/P Acute combined CHF - Hold further diuresis, add Metoprolol succinate - Would benefit from entresto, ACEi has been held we can likely start renally adjusted dose of entresto tomorrow. -Continue ASA 81mg  daily. -Likely discharge tomorrow.

## 2017-08-17 NOTE — Plan of Care (Signed)
Problem: Fluid Volume: Goal: Ability to maintain a balanced intake and output will improve Outcome: Progressing Pt urinating and reports that he is breathing better. Denies pain and shortness of breath but oxygen saturation dropped to 85% when walking down hall.

## 2017-08-17 NOTE — Progress Notes (Signed)
Patient's oxygen saturation was 97% on 2L [place] resting in bed and 93% on RA resting in bed.. Pt ambulated to end of hall. Denied shortness of breath but oxygen saturation dropped to 85% on RA while walking.

## 2017-08-17 NOTE — Progress Notes (Signed)
   Subjective:  Patient seen and examined. He states he feels much better than yesterday. He feels his breathing has improved. He denies chest pain or shortness of breath. He was able to sleep with one pillow with the head of the bed reclined and slept very well. He was ambulated by nursing and denied shortness of breath on exertion, but per nursing did desaturate below 90% on RA.   The results of the ECHO were discussed with the patient, as well as the plans to change is blood pressure medications. He agreed with the plan going forward.  Objective:  Vital signs in last 24 hours: Vitals:   08/16/17 1830 08/16/17 2033 08/17/17 0101 08/17/17 0541  BP: (!) 175/75 (!) 170/87 (!) 165/63 (!) 185/92  Pulse: 80 63  62  Resp: 18 19  18   Temp: 98.2 F (36.8 C) 99.2 F (37.3 C)  98.8 F (37.1 C)  TempSrc: Oral Oral  Oral  SpO2: 99% 97%  98%  Weight:  183 lb 8 oz (83.2 kg)    Height:       General: Laying in bed comfortably, NAD HEENT: [place]/AT, EOMI, no scleral icterus Cardiac: RRR, Systolic murmur best appreciated at apex Pulm: On 2L [place], normal effort, bibasilar crackles appreciated but improved from yesterday Abd: soft, non tender, non distended, BS normal Ext: extremities well perfused, no peripheral edema Neuro: alert and oriented X3, cranial nerves II-XII grossly intact   Assessment/Plan:  Active Problems:   Acute respiratory failure with hypoxia (HCC)   Acute combined systolic and diastolic congestive heart failure (HCC)  Acute Combined Systolic and diastolic CHF Exacerbation  Acute respiratory failure with hypoxia Patient continues to respond well to diuresis. 2 lb weight loss from yesterday, I/Os recorded may not be accurate as only -429 mL daily total. With ambulation patient desaturated with O2 sats below 90% both yesterday and this AM. Still on 2L of [place], not in respiratory distress and denies SOB on exertion. Creatinine elevated today to 3.15 from 2.79. Will hold further  diuresis. -Start Metoprolol 50 mg with plan to start [facility] tomorrow after ACE wash out period of 36 hours -Wean supplmental O2 to RA as tolerated - Daily weights, I/Os - telemetry  - Holding lisinopril and nifedipine - BMET in AM -Will require cardiology follow up as an outpatient  AKI on CKD Likely cardiorenal secondary to heart failure. Cr 3.15 today. Baseline in 02/2017 recorded ~1.5-2.  - Holding Lisinopril - BMET in AM  HTN Consistently hypertensive. Patient is stabilized and no longer in acute CHF, will start metoprolol 50 mg daily. Plans to start [facility] tomorrow. -Metoprolol 50 mg daily -Hydralazine 10 mg q6, titrate up as needed   Dispo: Anticipated discharge in approximately 1 day.   Melanee Spry, MD 08/17/2017, 8:40 AM Pager: [phone]

## 2017-08-18 LAB — BASIC METABOLIC PANEL
ANION GAP: 10 (ref 5–15)
BUN: 39 mg/dL — ABNORMAL HIGH (ref 6–20)
CALCIUM: 8.5 mg/dL — AB (ref 8.9–10.3)
CO2: 28 mmol/L (ref 22–32)
Chloride: 99 mmol/L — ABNORMAL LOW (ref 101–111)
Creatinine, Ser: 3.55 mg/dL — ABNORMAL HIGH (ref 0.61–1.24)
GFR, EST AFRICAN AMERICAN: 17 mL/min — AB (ref >60)
GFR, EST NON AFRICAN AMERICAN: 14 mL/min — AB (ref >60)
GLUCOSE: 90 mg/dL (ref 65–99)
Potassium: 4 mmol/L (ref 3.5–5.1)
Sodium: 137 mmol/L (ref 135–145)

## 2017-08-18 MED ORDER — CLONIDINE HCL ER 0.1 MG PO TB12
0.1000 mg | ORAL_TABLET | Freq: Two times a day (BID) | ORAL | Status: DC
  Administered 2017-08-18 (×2): 0.1 mg via ORAL
  Filled 2017-08-18 (×3): qty 1

## 2017-08-18 NOTE — Progress Notes (Signed)
   Subjective:  Patient seen and examined. Denies chest pain or shortness of breath. No shortness of breath with ambulation. States he fells well. No acute complaints at this time.   Objective:  Vital signs in last 24 hours: Vitals:   08/17/17 0925 08/17/17 1842 08/17/17 2124 08/18/17 0442  BP: (!) 174/95 (!) 169/92 (!) 191/104 (!) 188/95  Pulse: 64 63 (!) 56 (!) 50  Resp: 18 18 18 18   Temp: 98.7 F (37.1 C) 98.4 F (36.9 C) 98.7 F (37.1 C) 98.7 F (37.1 C)  TempSrc: Oral Oral Oral Oral  SpO2: 98% 98% 98% 98%  Weight:   180 lb 11.2 oz (82 kg)   Height:       General: Laying in bed comfortably, NAD HEENT: [place]/AT, EOMI, no scleral icterus Cardiac: RRR, Systolic murmur best appreciated at apex Pulm: On 1L [place], normal effort, + bibasilar and right upper lung field crackles  Abd: soft, non tender, non distended, BS normal Ext: extremities well perfused, no peripheral edema Neuro: alert and oriented X3, cranial nerves II-XII grossly intact   Assessment/Plan:  Active Problems:   Acute respiratory failure with hypoxia (HCC)   Acute combined systolic and diastolic congestive heart failure (HCC)  Acute Combined Systolic and diastolic CHF Exacerbation  Acute respiratory failure with hypoxia Crackles still appreciated on physical exam as well as B lines noted on POC ultrasound. Patient's blood pressure still elevated despite addition of metoprolol. No additional diuresis in the setting of rising creatinine of 3.55 today. Will not give back IVF as patient still with signs of pulmonary edema. Goal to improve BP control.  -Continue Metoprolol 50 mg  -Start Clonidine 0.1 mg BID  -Daily weights, I/Os -Telemetry  - BMET in AM -Will require cardiology follow up as an outpatient  AKI on CKD Cr 3.55 today. Baseline in 02/2017 recorded ~1.5-2.  - Holding fluid resuscitation  - BMET in AM  HTN Consistently hypertensive despite addition of metoprolol 50 mg daily. Adding clonidine for  better BP control.  -Metoprolol 50 mg daily -Clonidine 0.1 mg BID  Dispo: Anticipated discharge in approximately 1-2 days day.   Melanee Spry, MD 08/18/2017, 8:56 AM Pager: [phone]

## 2017-08-18 NOTE — Progress Notes (Signed)
Internal Medicine Attending:   I saw and examined the patient. I reviewed the resident's note and I agree with the resident's findings and plan as documented in the resident's note. Patient reports feeling better, on exam continued crackles at right base and mid lung fields, heart RRR, systolic murmur, no pedal edema.  POCUS revealed B lines and dilated IVC.  A/P Acute combined CHF - SCr rising, given continued evidence of volume overload I think we will hold our diuresis and work more on his HTN.  Would like to eventually start Entresto however will hold off for now due to renal function.  Will add clonidine.

## 2017-08-18 NOTE — Evaluation (Signed)
Physical Therapy Evaluation Patient Details Name: [name] [name] MRN: [id number] DOB: 02/09/1930 Today's Date: 08/18/2017   History of Present Illness  [name] [name] is an 83 year old male with a past medical history of hypertension, hyperlipidemia, CKD who presented with chief complaint of progressive dyspnea on exertion last several days.  He reports shortness of breath is worse when lying flat.  He chronically uses 2-3 pillows to prop him up during sleep he cannot remember why he started doing this. Presents with acute combined CHF  Clinical Impression   Pt admitted with above diagnosis. Pt currently with functional limitations due to the deficits listed below (see PT Problem List). Session conducted on Room Air, and noted O2 sats decr to 89% observed lowest (good pleth wave, pt asymptomatic for sOB); Sats increased to above 93% with focused breathing   Pt will benefit from skilled PT to increase their independence and safety with mobility to allow discharge to the venue listed below.        Follow Up Recommendations Other (comment) (Does he qualify for Cardiac REhab Phase 2?)    Equipment Recommendations  Cane    Recommendations for Other Services       Precautions / Restrictions Precautions Precautions: Other (comment) Precaution Comments: Watch O2 sats Restrictions Weight Bearing Restrictions: No      Mobility  Bed Mobility Overal bed mobility: Independent                Transfers Overall transfer level: Needs assistance Equipment used: None Transfers: Sit to/from Stand Sit to Stand: Supervision         General transfer comment: Supervision fo rsafety; cues to self-monitor for activity tolerance; no difficulty with rise  Ambulation/Gait Ambulation/Gait assistance: Min guard (without physical contact) Ambulation Distance (Feet): 150 Feet Assistive device: None (occasionally push Dinamap) Gait Pattern/deviations: Step-through pattern      General Gait Details: Cues to self-monitor for activity tolerance; mild instability with occasional small losses of balance from which pt recovered independently  Stairs            Wheelchair Mobility    Modified Rankin (Stroke Patients Only)       Balance                                             Pertinent Vitals/Pain Pain Assessment: No/denies pain    Home Living Family/patient expects to be discharged to:: Private residence Living Arrangements: Alone Available Help at Discharge: Friend(s) Type of Home: House Home Access: Stairs to enter   Technical brewer of Steps: 1 Home Layout: One level        Prior Function Level of Independence: Independent         Comments: still drives, grocery shops     Hand Dominance        Extremity/Trunk Assessment   Upper Extremity Assessment Upper Extremity Assessment: Overall WFL for tasks assessed    Lower Extremity Assessment Lower Extremity Assessment: Generalized weakness       Communication   Communication: No difficulties  Cognition Arousal/Alertness: Awake/alert Behavior During Therapy: WFL for tasks assessed/performed Overall Cognitive Status: Within Functional Limits for tasks assessed  General Comments General comments (skin integrity, edema, etc.): Session conducted on Room Air, and noted O2 sats decr to 89% observed lowest (good pleth wave, pt asymptomatic for sOB); Sats increased to above 93% with focused breathing    Exercises     Assessment/Plan    PT Assessment Patient needs continued PT services  PT Problem List Decreased strength;Decreased balance;Decreased activity tolerance;Cardiopulmonary status limiting activity       PT Treatment Interventions DME instruction;Gait training;Stair training;Functional mobility training;Therapeutic activities;Therapeutic exercise;Balance training;Patient/family education     PT Goals (Current goals can be found in the Care Plan section)  Acute Rehab PT Goals Patient Stated Goal: Hopes to get home soon PT Goal Formulation: With patient Time For Goal Achievement: 09/01/17 Potential to Achieve Goals: Good    Frequency Min 3X/week   Barriers to discharge        Co-evaluation               AM-PAC PT "6 Clicks" Daily Activity  Outcome Measure Difficulty turning over in bed (including adjusting bedclothes, sheets and blankets)?: None Difficulty moving from lying on back to sitting on the side of the bed? : None Difficulty sitting down on and standing up from a chair with arms (e.g., wheelchair, bedside commode, etc,.)?: None Help needed moving to and from a bed to chair (including a wheelchair)?: None Help needed walking in hospital room?: A Little Help needed climbing 3-5 steps with a railing? : A Little 6 Click Score: 22    End of Session Equipment Utilized During Treatment: Gait belt Activity Tolerance: Patient tolerated treatment well Patient left: in chair;with call bell/phone within reach Nurse Communication: Mobility status PT Visit Diagnosis: Unsteadiness on feet (R26.81)    Time: 6629-4765 (times are approximate) PT Time Calculation (min) (ACUTE ONLY): 28 min   Charges:   PT Evaluation $PT Eval Low Complexity: 1 Low PT Treatments $Gait Training: 8-22 mins   PT G Codes:        Roney Marion, PT  Acute Rehabilitation Services Pager [phone] Office [phone]   [place] 08/18/2017, 2:47 PM

## 2017-08-18 NOTE — Progress Notes (Signed)
Heart Failure Navigator Consult Note  Presentation: [name] [name] is an 83 year old male with a past medical history of hypertension, hyperlipidemia, CKD who presented with chief complaint of progressive dyspnea on exertion last several days.  He reports shortness of breath is worse when lying flat.  He chronically uses 2-3 pillows to prop him up during sleep he cannot remember why he started doing this.  He denies any cough, fever, chills, no leg swelling.  He reports that he is compliant with his blood pressure medications.  He does note that he checks his blood pressure at home and usually his systolic is in the 694W.  He has previously been told he had a mild heart murmur but otherwise his heart was healthy.  He denies any chest pain.  In the emergency department his chest x-ray revealed bilateral infiltrates versus vascular congestion, he was empirically started on antibiotics in the emergency department.  A BNP was notably elevated to 1300.  Troponin was negative x2.  D-dimer was mildly elevated.  He was admitted to internal medicine, who felt his presentation was likely congestive heart failure and started with IV diuresis.  He reports he is breathing much easier today feels much better overall but has been urinating frequently.   Past Medical History:  Diagnosis Date  . CHF (congestive heart failure) ([place])   . Heart murmur   . History of gout ~ 1998  . Hyperlipidemia   . Hypertension   . Prostate cancer ([place]) ~ 1998   S/P radiation    Social History   Social History  . Marital status: Married    Spouse name: N/A  . Number of children: N/A  . Years of education: N/A   Social History Main Topics  . Smoking status: Former Smoker    Packs/day: 1.00    Years: 46.00    Types: Cigarettes    Quit date: 1998  . Smokeless tobacco: Never Used  . Alcohol use No  . Drug use: No  . Sexual activity: Not Asked   Other Topics Concern  . None   Social History Narrative  . None     ECHO:Study Conclusions-08/16/17  - Left ventricle: The cavity size was normal. Wall thickness was   increased in a pattern of mild LVH. Septal-lateral dyssynchrony   suggestive of LBBB/IVCD. The estimated ejection fraction was 35%.   Diffuse hypokinesis. Doppler parameters are consistent with   abnormal left ventricular relaxation (grade 1 diastolic   dysfunction). - Aortic valve: There was no stenosis. - Aorta: Mildly dilated aortic root. Aortic root dimension: 41 mm   (ED). - Mitral valve: There was mild regurgitation. - Left atrium: The atrium was mildly dilated. - Right ventricle: The cavity size was normal. Systolic function   was normal. - Right atrium: The atrium was mildly dilated. - Tricuspid valve: Peak RV-RA gradient (S): 45 mm Hg. - Pulmonary arteries: PA peak pressure: 48 mm Hg (S). - Inferior vena cava: The vessel was normal in size. The   respirophasic diameter changes were in the normal range (>= 50%),   consistent with normal central venous pressure.  Impressions:  - Normal LV size with mild LV hypertrophy. EF 35%, diffuse   hypokinesis with septal-lateral dyssynchroncy. Normal RV size and   systolic function. Mild pulmonary hypertension. No significant   valvular abnormalities.   BNP    Component Value Date/Time   BNP 1,326.9 (H) 08/16/2017 0004    ProBNP No results found for: PROBNP   Education Assessment  and Provision:  Detailed education and instructions provided on heart failure disease management including the following:  Signs and symptoms of Heart Failure When to call the physician Importance of daily weights Low sodium diet Fluid restriction Medication management Anticipated future follow-up appointments  Patient education given on each of the above topics.  Patient acknowledges understanding and acceptance of all instructions. I spoke briefly with [name].  He tells me that he had never been in the hospital before.  I  briefly discussed his HF and recommendations for home.  He has a scale at home-I reviewed the importance of daily weights and when to contact the physician.  I also discussed a low sodium diet and high sodium foods to avoid.  He admits that he often eats potato and onion soup--(homemade) but that he adds "quite a bit" of salt.  He denies having a cardiologist and tells me that he sees Internal Medicine doctors.  He wishes to be referred to a cardiologist and I have scheduled him a follow-up appt with [facility] after discharge.  Education Materials:  "Living Better With Heart Failure" Booklet, Daily Weight Tracker Tool    High Risk Criteria for Readmission and/or Poor Patient Outcomes:  (Recommend Follow-up with [facility])--yes   EF <30%- No 35%  2 or more admissions in 6 months- No  Difficult social situation-No  Demonstrates medication noncompliance- No denies   Barriers of Care:  Insight ,health literacy, knowledge and compliance  Discharge Planning:   Plans to return home alone.  He could benefit from [facility] care management and HF [facility] program for ongoing HF education, compliance reinforcement and symptom recognition.  I will refer him for both programs.  I have also scheduled a follow-up appt in the [facility] after discharge on 08/25/17 at 11:30 AM.

## 2017-08-18 NOTE — Discharge Instructions (Signed)
Do the following things EVERYDAY: 1) Weigh yourself in the morning before breakfast. Write it down and keep it in a log. 2) Take your medicines as prescribed 3) Eat low salt foods--Limit salt (sodium) to 2000 mg per day.  4) Stay as active as you can everyday 5) Limit all fluids for the day to less than 2 liters

## 2017-08-19 DIAGNOSIS — N182 Chronic kidney disease, stage 2 (mild): Secondary | ICD-10-CM

## 2017-08-19 LAB — BASIC METABOLIC PANEL
ANION GAP: 10 (ref 5–15)
BUN: 45 mg/dL — AB (ref 6–20)
CALCIUM: 8.4 mg/dL — AB (ref 8.9–10.3)
CO2: 27 mmol/L (ref 22–32)
CREATININE: 4.2 mg/dL — AB (ref 0.61–1.24)
Chloride: 100 mmol/L — ABNORMAL LOW (ref 101–111)
GFR calc Af Amer: 14 mL/min — ABNORMAL LOW (ref >60)
GFR, EST NON AFRICAN AMERICAN: 12 mL/min — AB (ref >60)
GLUCOSE: 100 mg/dL — AB (ref 65–99)
Potassium: 3.8 mmol/L (ref 3.5–5.1)
Sodium: 137 mmol/L (ref 135–145)

## 2017-08-19 MED ORDER — ISOSORB DINITRATE-HYDRALAZINE 20-37.5 MG PO TABS
1.0000 | ORAL_TABLET | Freq: Three times a day (TID) | ORAL | Status: DC
  Administered 2017-08-19 – 2017-08-22 (×9): 1 via ORAL
  Filled 2017-08-19 (×8): qty 1

## 2017-08-19 MED ORDER — HYDRALAZINE HCL 20 MG/ML IJ SOLN
5.0000 mg | Freq: Four times a day (QID) | INTRAMUSCULAR | Status: DC | PRN
  Administered 2017-08-20 – 2017-08-25 (×2): 5 mg via INTRAVENOUS
  Filled 2017-08-19 (×2): qty 1

## 2017-08-19 MED ORDER — FUROSEMIDE 10 MG/ML IJ SOLN
40.0000 mg | Freq: Once | INTRAMUSCULAR | Status: AC
  Administered 2017-08-19: 40 mg via INTRAVENOUS
  Filled 2017-08-19: qty 4

## 2017-08-19 MED ORDER — CLONIDINE HCL 0.1 MG PO TABS
0.1000 mg | ORAL_TABLET | Freq: Three times a day (TID) | ORAL | Status: DC
  Administered 2017-08-19 – 2017-08-22 (×9): 0.1 mg via ORAL
  Filled 2017-08-19 (×9): qty 1

## 2017-08-19 NOTE — Progress Notes (Signed)
   Subjective:  Patient seen and examined. Denies chest pain or shortness of breath. No shortness of breath with ambulation, says he felt weak when working with physical therapy but had no respiratory complaints. He also denies dizziness or lightheadedness upon ambulation. No acute complaints at this time.   Objective:  Vital signs in last 24 hours: Vitals:   08/18/17 0925 08/18/17 1958 08/19/17 0515 08/19/17 0959  BP: (!) 194/96 (!) 183/86 (!) 162/75 (!) 177/82  Pulse: (!) 55 (!) 55 (!) 50 (!) 48  Resp: 18 18 18 18   Temp: 98.3 F (36.8 C) 97.9 F (36.6 C) (!) 97.5 F (36.4 C) 98.2 F (36.8 C)  TempSrc: Oral Oral Oral Oral  SpO2: 97% 95% 96% 98%  Weight:  179 lb 11.2 oz (81.5 kg)    Height:       General: Laying in bed comfortably, NAD HEENT: [place]/AT, EOMI, no scleral icterus Cardiac: RRR, Systolic murmur best appreciated at apex Pulm: Normal effort, + bibasilar and right upper lung field crackles  Abd: soft, non tender, non distended, BS normal Ext: extremities well perfused, no peripheral edema Neuro: alert and oriented X3, cranial nerves II-XII grossly intact   Assessment/Plan:  Active Problems:   Acute respiratory failure with hypoxia (HCC)   Acute combined systolic and diastolic congestive heart failure (HCC)  Acute Combined Systolic and diastolic CHF Exacerbation  Acute respiratory failure with hypoxia Patient is asymptomatic with no shortness of breath or chest discomfort. Crackles still appreciated on physical exam. Patient's blood pressure improved with addition of clonidine. Will reassess volume status with POC ultrasound to see if additional diuresis is required. Continuing to adjust BP meds for improved BP control.  -POc ultrasound to assess volume status -Continue Metoprolol 50 mg  -Start Clonidine 0.1 mg TID  -Daily weights, I/Os -Telemetry - normal sinus rhythm  - BMET in AM -Follow-up appt in the [facility] after discharge on 08/25/17 at 11:30 AM.  AKI  on CKD Cr continuing to rise:  3.55>>4.20. Baseline in 02/2017 recorded ~1.5-2. Not improved with holding diuresis, most likely cardiorenal.  -BP control -Will reassess with POC ultrasound to see if additional diuresis is required. - BMET in AM  HTN BP improved 162/75, 177/82 but still hypertensive and not at goal.   -Metoprolol 50 mg daily -Clonidine 0.1 mg TID  Dispo: Anticipated discharge in approximately 1-2 days.   Melanee Spry, MD 08/19/2017, 11:12 AM Pager: [phone]

## 2017-08-19 NOTE — Progress Notes (Signed)
Physical Therapy Treatment Patient Details Name: [name] [name] MRN: [id number] DOB: 1930-02-06 Today's Date: 08/19/2017    History of Present Illness [name] [name] is an 83 year old male with a past medical history of hypertension, hyperlipidemia, CKD who presented with chief complaint of progressive dyspnea on exertion last several days.  He reports shortness of breath is worse when lying flat.  He chronically uses 2-3 pillows to prop him up during sleep he cannot remember why he started doing this. Presents with acute combined CHF    PT Comments    Pt is up to walk and to do there ex with PT with good effort and controlled vitals.  His O2 was not below 90% with any effort, and noted his signs of exertion were not much.  Will continue on with mobility and strengthening as ordered.  He is expecting to go to outpatient for follow up therapy when finished with hosp, very motivated and alert, able to verbalize his discharge plan.     Follow Up Recommendations  Other (comment) (cardiac rehab to follow outpatient)     Equipment Recommendations  Cane    Recommendations for Other Services       Precautions / Restrictions Precautions Precautions: Other (comment) (monitor O2 sats and pulses) Restrictions Weight Bearing Restrictions: No    Mobility  Bed Mobility Overal bed mobility: Independent                Transfers Overall transfer level: Modified independent Equipment used: None Transfers: Sit to/from Stand Sit to Stand: Supervision (for safety only)            Ambulation/Gait Ambulation/Gait assistance: Min guard (for safety only with dynamap on continual O2 sat and pulses) Ambulation Distance (Feet): 200 Feet (140 + 60) Assistive device: None Gait Pattern/deviations: Step-through pattern;Decreased stride length;Shuffle;Wide base of support;Trunk flexed Gait velocity: reduced Gait velocity interpretation: Below normal speed for age/gender General  Gait Details: pt is stepping close to dynamap and nearly catching toes   Stairs            Wheelchair Mobility    Modified Rankin (Stroke Patients Only)       Balance Overall balance assessment: No apparent balance deficits (not formally assessed)                                          Cognition Arousal/Alertness: Awake/alert Behavior During Therapy: WFL for tasks assessed/performed Overall Cognitive Status: Within Functional Limits for tasks assessed                                        Exercises General Exercises - Lower Extremity Long Arc Quad: Strengthening;Both;10 reps Heel Slides: Strengthening;Both;10 reps Hip ABduction/ADduction: Strengthening;Both;10 reps Hip Flexion/Marching: AROM;Both;10 reps    General Comments General comments (skin integrity, edema, etc.): Pt on room air, noted range of sats with gait were 90-97% and pulses 44-60 during gait      Pertinent Vitals/Pain Pain Assessment: No/denies pain    Home Living                      Prior Function            PT Goals (current goals can now be found in the care plan section) Acute Rehab PT Goals Patient  Stated Goal: Home ASAP Progress towards PT goals: Progressing toward goals    Frequency    Min 3X/week      PT Plan Current plan remains appropriate    Co-evaluation              AM-PAC PT "6 Clicks" Daily Activity  Outcome Measure  Difficulty turning over in bed (including adjusting bedclothes, sheets and blankets)?: None Difficulty moving from lying on back to sitting on the side of the bed? : None Difficulty sitting down on and standing up from a chair with arms (e.g., wheelchair, bedside commode, etc,.)?: None Help needed moving to and from a bed to chair (including a wheelchair)?: None Help needed walking in hospital room?: A Little Help needed climbing 3-5 steps with a railing? : A Little 6 Click Score: 22    End of  Session Equipment Utilized During Treatment: Gait belt Activity Tolerance: Patient tolerated treatment well Patient left: in bed;with call bell/phone within reach Nurse Communication: Mobility status PT Visit Diagnosis: Unsteadiness on feet (R26.81)     Time: 1586-8257 PT Time Calculation (min) (ACUTE ONLY): 38 min  Charges:  $Gait Training: 8-22 mins $Therapeutic Exercise: 8-22 mins $Therapeutic Activity: 8-22 mins                    G Codes:  Functional Assessment Tool Used: AM-PAC 6 Clicks Basic Mobility     Ramond Dial 08/19/2017, 3:41 PM   Mee Hives, PT MS Acute Rehab Dept. Number: [place]

## 2017-08-19 NOTE — Progress Notes (Signed)
Internal Medicine Attending:   I saw and examined the patient. I reviewed the resident's note and I agree with the resident's findings and plan as documented in the resident's note. Patient reports feeling better, now off supplemental O2. No complaints.  BP improved but still markedly elevated. SCr continues to rise.  On exam: no distress, bibasilar crackles, elevated jvd, heart RRR systolic murmur, POCUS, B lines noted in some lung fields  But improved compared to yesterday, IVC remains dilated.  A/P Acute combined CHF - Continue to work on BP control. No ACE/ARB right now due to AKI/CKD.  Would go ahead and start bidil.  Continue clonidine for now- hopefully once better controlled/ renal function improved we can change to entresto. - Restart IV lasix. -Has follow up scheduled with CHF clinic on 08/25/17. [facility] care management consulted.  AKI on CKD Stage 2 - Renal function worsening, still has signs of acute CHF, will restart diuresis.  HTN - Improved with clonidine. Add Bidil given unable to start ACE/ARB at this time.

## 2017-08-19 NOTE — Evaluation (Signed)
Occupational Therapy Evaluation and Discharge Patient Details Name: [name] [name] MRN: [id number] DOB: 09-03-1930 Today's Date: 08/19/2017    History of Present Illness [name] [name] is an 83 year old male with a past medical history of hypertension, hyperlipidemia, CKD who presented with chief complaint of progressive dyspnea on exertion last several days.  He reports shortness of breath is worse when lying flat.  He chronically uses 2-3 pillows to prop him up during sleep he cannot remember why he started doing this. Presents with acute combined CHF   Clinical Impression   PTA Pt independent in ADL and mobility. Pt currently at baseline and no LOB or difficulties with ADL. Pt educated on pursed lip breathing for O2 levels (dropped to 89 on room air prior to PLB), and verbally educated on energy conservation. Pt was very pleasant and cooperative throughout session, a joy to work with. Pt with no questions or concerns for OT at the end of session, OT education complete and OT to sign off. Thank you for the opportunity to work with this patient.    Follow Up Recommendations  No OT follow up    Equipment Recommendations  None recommended by OT    Recommendations for Other Services       Precautions / Restrictions Precautions Precautions: Other (comment) Precaution Comments: Watch O2 sats Restrictions Weight Bearing Restrictions: No      Mobility Bed Mobility Overal bed mobility: Independent                Transfers Overall transfer level: Needs assistance Equipment used: None Transfers: Sit to/from Stand Sit to Stand: Supervision         General transfer comment: Supervision for safety    Balance Overall balance assessment: No apparent balance deficits (not formally assessed)                                         ADL either performed or assessed with clinical judgement   ADL Overall ADL's : Independent                                       General ADL Comments: Pt able to perform toilet transfer, simulate peri care and LB pant pull up, don/doff sock, sink level grooming, and follow multi-step commands for oral care.     Vision Patient Visual Report: No change from baseline       Perception     Praxis      Pertinent Vitals/Pain Pain Assessment: No/denies pain     Hand Dominance     Extremity/Trunk Assessment Upper Extremity Assessment Upper Extremity Assessment: Overall WFL for tasks assessed   Lower Extremity Assessment Lower Extremity Assessment: Overall WFL for tasks assessed   Cervical / Trunk Assessment Cervical / Trunk Assessment: Normal   Communication Communication Communication: No difficulties   Cognition Arousal/Alertness: Awake/alert Behavior During Therapy: WFL for tasks assessed/performed Overall Cognitive Status: Within Functional Limits for tasks assessed                                     General Comments  O2 dropped to 89 on room air during mobility and ADL activities. Pt educated in pursed lip breathing and O2 was at or above  90 after that.    Exercises     Shoulder Instructions      Home Living Family/patient expects to be discharged to:: Private residence Living Arrangements: Alone Available Help at Discharge: Friend(s) Type of Home: House Home Access: Stairs to enter Technical brewer of Steps: 1   Home Layout: One level     Bathroom Shower/Tub: Teacher, early years/pre: Standard     Home Equipment: None          Prior Functioning/Environment Level of Independence: Independent        Comments: still drives, grocery shops        OT Problem List:        OT Treatment/Interventions:      OT Goals(Current goals can be found in the care plan section) Acute Rehab OT Goals Patient Stated Goal: Hopes to get home soon OT Goal Formulation: With patient Time For Goal Achievement: 08/25/17 Potential  to Achieve Goals: Good  OT Frequency:     Barriers to D/C:            Co-evaluation              AM-PAC PT "6 Clicks" Daily Activity     Outcome Measure Help from another person eating meals?: None Help from another person taking care of personal grooming?: None Help from another person toileting, which includes using toliet, bedpan, or urinal?: None Help from another person bathing (including washing, rinsing, drying)?: None Help from another person to put on and taking off regular upper body clothing?: None Help from another person to put on and taking off regular lower body clothing?: None 6 Click Score: 24   End of Session Equipment Utilized During Treatment: Gait belt Nurse Communication: Mobility status  Activity Tolerance: Patient tolerated treatment well Patient left: in bed;with call bell/phone within reach                   Time: 1023-1033 OT Time Calculation (min): 10 min Charges:  OT General Charges $OT Visit: 1 Visit OT Evaluation $OT Eval Low Complexity: 1 Low G-Codes:     [name] [name] [phone]  Merri Ray Kecia Swoboda 08/19/2017, 10:44 AM

## 2017-08-20 DIAGNOSIS — R001 Bradycardia, unspecified: Secondary | ICD-10-CM | POA: Diagnosis not present

## 2017-08-20 DIAGNOSIS — T50905A Adverse effect of unspecified drugs, medicaments and biological substances, initial encounter: Secondary | ICD-10-CM

## 2017-08-20 LAB — BASIC METABOLIC PANEL
ANION GAP: 12 (ref 5–15)
BUN: 59 mg/dL — ABNORMAL HIGH (ref 6–20)
CALCIUM: 8.4 mg/dL — AB (ref 8.9–10.3)
CO2: 26 mmol/L (ref 22–32)
Chloride: 97 mmol/L — ABNORMAL LOW (ref 101–111)
Creatinine, Ser: 5.62 mg/dL — ABNORMAL HIGH (ref 0.61–1.24)
GFR, EST AFRICAN AMERICAN: 10 mL/min — AB (ref >60)
GFR, EST NON AFRICAN AMERICAN: 8 mL/min — AB (ref >60)
Glucose, Bld: 101 mg/dL — ABNORMAL HIGH (ref 65–99)
POTASSIUM: 4.2 mmol/L (ref 3.5–5.1)
SODIUM: 135 mmol/L (ref 135–145)

## 2017-08-20 LAB — URINALYSIS, ROUTINE W REFLEX MICROSCOPIC
BACTERIA UA: NONE SEEN
Bilirubin Urine: NEGATIVE
GLUCOSE, UA: NEGATIVE mg/dL
Hgb urine dipstick: NEGATIVE
Ketones, ur: NEGATIVE mg/dL
Leukocytes, UA: NEGATIVE
Nitrite: NEGATIVE
PROTEIN: 100 mg/dL — AB
Specific Gravity, Urine: 1.013 (ref 1.005–1.030)
pH: 5 (ref 5.0–8.0)

## 2017-08-20 LAB — IRON AND TIBC
IRON: 22 ug/dL — AB (ref 45–182)
Saturation Ratios: 12 % — ABNORMAL LOW (ref 17.9–39.5)
TIBC: 190 ug/dL — AB (ref 250–450)
UIBC: 168 ug/dL

## 2017-08-20 LAB — PHOSPHORUS: Phosphorus: 6.1 mg/dL — ABNORMAL HIGH (ref 2.5–4.6)

## 2017-08-20 LAB — SODIUM, URINE, RANDOM

## 2017-08-20 LAB — FERRITIN: Ferritin: 235 ng/mL (ref 24–336)

## 2017-08-20 LAB — CREATININE, URINE, RANDOM: Creatinine, Urine: 196.77 mg/dL

## 2017-08-20 NOTE — Progress Notes (Signed)
   Subjective:  Patient seen and examined. Denies chest pain or shortness of breath with exertion. No acute complaints at this time.   Doing well with physical therapy and ambulation.   Objective:  Vital signs in last 24 hours: Vitals:   08/19/17 1710 08/19/17 2102 08/20/17 0443 08/20/17 1000  BP: (!) 165/78 (!) 152/73 (!) 169/79 (!) 144/58  Pulse: (!) 55 (!) 49 (!) 46 (!) 46  Resp: 18 18 18 18   Temp: 98.2 F (36.8 C) 98 F (36.7 C) 97.8 F (36.6 C) 98.3 F (36.8 C)  TempSrc: Oral Oral Oral Oral  SpO2: 99% 96% 97% 96%  Weight:  184 lb 8.4 oz (83.7 kg)    Height:       General: Laying in bed comfortably, NAD HEENT: /AT, EOMI, no scleral icterus Cardiac: RRR, Systolic murmur best appreciated at apex Pulm: Normal effort, crackles appreciated in left lower lung field, but improved Abd: soft, non tender, non distended, BS normal Ext: extremities well perfused, no peripheral edema Neuro: alert and oriented X3, cranial nerves II-XII grossly intact   Assessment/Plan:  Active Problems:   Acute respiratory failure with hypoxia (HCC)   Acute combined systolic and diastolic congestive heart failure (HCC)  Acute Combined Systolic and diastolic CHF Exacerbation  Acute respiratory failure with hypoxia Patient is asymptomatic with no shortness of breath or chest discomfort, lung exam improved, no JVD. BP improved today 144/58. Pt had a  2.03 pause on tele with HR of 39. Will hold metoprolol today.  -Continue Clonidine 0.1 mg TID  -Continue Bidil TID -Telemetry - sinus bradycardia - BMET in AM -Follow-up appt in the [facility] after discharge on 08/25/17 at 11:30 AM.  AKI on CKD Cr continuing to rise:  3.55>>4.20>>5.62. GFR 10.  -Nephrology consulted, appreciate recs - BMET in AM  HTN BP improved 144/58. -Holding Metoprolol 50 mg daily -Clonidine 0.1 mg TID -Bidil TID  Dispo: Anticipated discharge in approximately 1-2 days.   Melanee Spry, MD 08/20/2017, 11:20  AM Pager: [phone]

## 2017-08-20 NOTE — Consult Note (Signed)
Reason for Consult: AK I with CK D Referring Physician: Dr. Heber Seymour  HPI. [name] [name] is an 83 y.o. male. With PMHx significant for hypertension, CK D 3, prostate cancer 1999(Recent PSA in 5/18-1.7),admitted with worsening dyspnea and orthopnea of 2 days.in ED he was hypoxic to 85% which improved with 2 L of oxygen. Found to have ejection fraction of 35% with diffuse hypokinesia, BNP elevated at 1300, troponin within normal limits, pulmonary edema, previous ejection fraction in 2008 was normal.  His creatinine on admission was 2.92, mildly improved to 2.75 with some diuresis, then continued to get worse, today it was 5.62, which prompted nephrology consult. Baseline creatinine 1.4-1.52 years ago, it was checked in May 2018 during office visit and it was a 2.06.  Patient was feeling better when seen this morning, denies any more shortness of breath, no chest pain, saturating well on room air. Alert and oriented, no nausea or vomiting, appetite normal, denies any urinary symptoms except nocturia, no other signs of uremia.   Trend in Creatinine: Creat  Date/Time Value Ref Range Status  07/03/2014 02:09 PM 1.54 (H) 0.50 - 1.35 mg/dL Final  09/05/2013 03:30 PM 1.56 (H) 0.50 - 1.35 mg/dL Final  04/17/2013 09:09 AM 1.53 (H) 0.50 - 1.35 mg/dL Final  04/04/2013 02:04 PM 1.52 (H) 0.50 - 1.35 mg/dL Final  10/27/2012 11:21 AM 1.31 0.50 - 1.35 mg/dL Final   Creatinine, Ser  Date/Time Value Ref Range Status  08/20/2017 03:27 AM 5.62 (H) 0.61 - 1.24 mg/dL Final  08/19/2017 02:47 AM 4.20 (H) 0.61 - 1.24 mg/dL Final  08/18/2017 03:14 AM 3.55 (H) 0.61 - 1.24 mg/dL Final  08/17/2017 08:17 AM 3.15 (H) 0.61 - 1.24 mg/dL Final  08/16/2017 07:03 AM 2.79 (H) 0.61 - 1.24 mg/dL Final  08/16/2017 12:04 AM 2.92 (H) 0.61 - 1.24 mg/dL Final  02/23/2017 03:31 PM 2.06 (H) 0.76 - 1.27 mg/dL Final  07/30/2015 04:06 PM 1.40 (H) 0.76 - 1.27 mg/dL Final  09/15/2010 10:35 PM 1.39 0.40 - 1.50 mg/dL Final   06/05/2010 09:25 PM 1.41 0.40 - 1.50 mg/dL Final  08/01/2009 07:44 PM 1.39 0.40 - 1.50 mg/dL Final  04/22/2009 08:54 PM 1.83 (H) 0.40 - 1.50 mg/dL Final  03/20/2009 08:17 PM 1.51 (H) 0.40 - 1.50 mg/dL Final  07/28/2007 10:48 AM 1.4 0.4 - 1.5 mg/dL Final  07/20/2007 11:42 AM 1.4 0.4 - 1.5 mg/dL Final  07/15/2007 10:44 AM 1.5 0.4 - 1.5 mg/dL Final    PMH:   Past Medical History:  Diagnosis Date  . CHF (congestive heart failure) ([place])   . Heart murmur   . History of gout ~ 1998  . Hyperlipidemia   . Hypertension   . Prostate cancer ([place]) ~ 1998   S/P radiation    PSH:   Past Surgical History:  Procedure Laterality Date  . CATARACT EXTRACTION W/ INTRAOCULAR LENS  IMPLANT, BILATERAL Bilateral     Allergies: No Known Allergies  Medications:   Prior to Admission medications   Medication Sig Start Date End Date Taking? Authorizing Provider  brimonidine (ALPHAGAN) 0.2 % ophthalmic solution Place 1 drop into both eyes 2 (two) times daily. 07/07/17  Yes [provider]  latanoprost (XALATAN) 0.005 % ophthalmic solution Place 1 drop into both eyes at bedtime.    Yes [provider]  lisinopril (PRINIVIL,ZESTRIL) 40 MG tablet TAKE 1 TABLET (40 MG TOTAL) BY MOUTH DAILY. 05/21/17  Yes Tawny Asal, MD  NIFEdipine (PROCARDIA XL/ADALAT-CC) 90 MG 24 hr tablet TAKE 1 TABLET (  90 MG TOTAL) BY MOUTH DAILY. 02/22/17  Yes Rivet, Sindy Guadeloupe, MD  simvastatin (ZOCOR) 10 MG tablet TAKE 1 TABLET (10 MG TOTAL) BY MOUTH DAILY AT 6 PM. 02/04/17  Yes Rivet, Carly J, MD  timolol (TIMOPTIC) 0.5 % ophthalmic solution Place 1 drop into both eyes daily. 06/02/17  Yes [provider]  Cholecalciferol 50000 units TABS Take 50,000 Units by mouth once a week. Patient not taking: Reported on 08/16/2017 02/24/17   Rivet, Sindy Guadeloupe, MD  diclofenac sodium (VOLTAREN) 1 % GEL Apply 2 g topically 4 (four) times daily. Patient not taking: Reported on 08/16/2017 07/30/15   Rivet, Sindy Guadeloupe, MD    Inpatient  medications: . aspirin EC  81 mg Oral Daily  . brimonidine  1 drop Both Eyes BID  . cloNIDine  0.1 mg Oral TID  . heparin  5,000 Units Subcutaneous Q8H  . isosorbide-hydrALAZINE  1 tablet Oral TID  . latanoprost  1 drop Both Eyes QHS  . metoprolol succinate  50 mg Oral Daily  . sodium chloride flush  3 mL Intravenous Q12H  . timolol  1 drop Both Eyes Daily    Discontinued Meds:   Medications Discontinued During This Encounter  Medication Reason  . albuterol (PROVENTIL) (2.5 MG/3ML) 0.083% nebulizer solution 5 mg   . dorzolamide (TRUSOPT) 2 % ophthalmic solution Change in therapy  . azithromycin (ZITHROMAX) 500 mg in dextrose 5 % 250 mL IVPB   . furosemide (LASIX) injection 20 mg   . hydrALAZINE (APRESOLINE) tablet 10 mg   . furosemide (LASIX) injection 40 mg   . cloNIDine HCl (KAPVAY) ER tablet 0.1 mg   . hydrALAZINE (APRESOLINE) tablet 10 mg     Social History:  reports that he quit smoking about 20 years ago. His smoking use included Cigarettes. He has a 46.00 pack-year smoking history. He has never used smokeless tobacco. He reports that he does not drink alcohol or use drugs.  Family History:   Family History  Problem Relation Age of Onset  . Hypertension Father   . Hypertension Sister   . Hypertension Brother     Pertinent items are noted in HPI. Weight change: 4 lb 13.2 oz (2.189 kg)  Intake/Output Summary (Last 24 hours) at 08/20/17 1157 Last data filed at 08/20/17 1100  Gross per 24 hour  Intake              940 ml  Output              925 ml  Net               15 ml   BP (!) 144/58 (BP Location: Left Arm)   Pulse (!) 46   Temp 98.3 F (36.8 C) (Oral)   Resp 18   Ht 6\' 2"  (1.88 m)   Wt 184 lb 8.4 oz (83.7 kg)   SpO2 96%   BMI 23.69 kg/m  Vitals:   08/19/17 1710 08/19/17 2102 08/20/17 0443 08/20/17 1000  BP: (!) 165/78 (!) 152/73 (!) 169/79 (!) 144/58  Pulse: (!) 55 (!) 49 (!) 46 (!) 46  Resp: 18 18 18 18   Temp: 98.2 F (36.8 C) 98 F (36.7 C) 97.8  F (36.6 C) 98.3 F (36.8 C)  TempSrc: Oral Oral Oral Oral  SpO2: 99% 96% 97% 96%  Weight:  184 lb 8.4 oz (83.7 kg)    Height:          General: Vital signs reviewed.  Patient is  well-developed and well-nourished, in no acute distress and cooperative with exam.  Head: Normocephalic and atraumatic. Eyes: EOMI, conjunctivae normal, no scleral icterus.  Neck: Supple, trachea midline, normal ROM, mild JVD, no masses, thyromegaly, or carotid bruit present. PCL Cardiovascular: mild bradycardia., S1 normal, S2 normal,  2/6 murmurs,  No gallops, or rubs. Pulmonary/Chest: bilateral basal crackles with decreased air entry at bases. Abdominal: Soft, non-tender, non-distended, BS+   Extremities: No lower extremity edema bilaterally,  pulses symmetric and intact bilaterally. No cyanosis or clubbing. Skin: Warm, dry and intact. No rashes or erythema. Psychiatric: Normal mood and affect. speech and behavior is normal. Cognition and memory are normal.  Labs: Basic Metabolic Panel:  Recent Labs Lab 08/16/17 0004 08/16/17 0703 08/17/17 0817 08/18/17 0314 08/19/17 0247 08/20/17 0327  NA 135 137 138 137 137 135  K 4.3 4.2 3.9 4.0 3.8 4.2  CL 105 102 101 99* 100* 97*  CO2 22 25 27 28 27 26   GLUCOSE 137* 95 92 90 100* 101*  BUN 27* 25* 29* 39* 45* 59*  CREATININE 2.92* 2.79* 3.15* 3.55* 4.20* 5.62*  ALBUMIN 3.6  --   --   --   --   --   CALCIUM 8.7* 8.4* 8.8* 8.5* 8.4* 8.4*   Liver Function Tests:  Recent Labs Lab 08/16/17 0004  AST 17  ALT 11*  ALKPHOS 70  BILITOT 0.7  PROT 8.0  ALBUMIN 3.6   No results for input(s): LIPASE, AMYLASE in the last 168 hours. No results for input(s): AMMONIA in the last 168 hours. CBC:  Recent Labs Lab 08/16/17 0004 08/17/17 0817  WBC 8.7 4.9  NEUTROABS 7.2  --   HGB 11.6* 10.4*  HCT 35.5* 32.6*  MCV 91.3 92.4  PLT 202 166   PT/INR: @LABRCNTIP (inr:5) Cardiac Enzymes: )No results for input(s): CKTOTAL, CKMB, CKMBINDEX, TROPONINI in the  last 168 hours. CBG: No results for input(s): GLUCAP in the last 168 hours.  Iron Studies: No results for input(s): IRON, TIBC, TRANSFERRIN, FERRITIN in the last 168 hours.  Xrays/Other Studies: No results found.   Assessment/Plan: 1.  AKI with CKD. Patient has significant decline in his renal function during current hospitalization.  hypoperfusion is less likely as his blood pressure remains mildly elevated. He denies any use of NSAID and no family history of renal disease.UA was positive for mild protein urea.he was on ACE inhibitor before presentation, might be responsible, they will discontinue on admission.Have to rule out postrenal causes. - Foley -Renal ultrasound. -Urinary sodium and creatinine. -PTH -check orthostatic vitals. -monitor renal function. No evidence inflam or intrinsic renal origin for ^^Cr, post renal or prerenal    2.  HFrEF. Patient remained asymptomatic with no hypoxia or shortness of breath. Patient was little bradycardic today. He does not appear very volume overload, or hypoxic. -discontinue metoprolol. -No need for Lasix at this time.  3. Hypertension. Seems improving.continue current management with Bidil and clonidine.  4. Anemia. Hemoglobin at 10.4 on October 23, 5 month ago it was 12.7. -check iron studies.   Lorella Nimrod  I have seen and examined this patient and agree with the plan of care  Seen, eval, examined, and discussed with resident. .  Ronne Savoia L 08/20/2017, 2:34 PM  08/20/2017, 11:57 AM

## 2017-08-20 NOTE — Care Management Important Message (Signed)
Important Message  Patient Details  Name: [name] [name] MRN: [id number] Date of Birth: 1930-03-16   Medicare Important Message Given:  Yes    Nathen May 08/20/2017, 9:46 AM

## 2017-08-20 NOTE — Progress Notes (Signed)
Cardiac monitoring staff called regarding pause of 2.03 and HR of 39, Paged Dr. Aggie Hacker, gave order to hold Metoprolol for today.

## 2017-08-20 NOTE — Progress Notes (Signed)
Internal Medicine Attending:   I saw and examined the patient. I reviewed the resident's note and I agree with the resident's findings and plan as documented in the resident's note. No complaints.  Continues to do well off supplemental O2, reports working with therapy yesterday went well. On exam: resting comfortably, continues to have some mild left lower base crackles, JVD only minimally elevated- improved. Heart RRR 2/6 systolic murmur, no LE edema. A/P Acute combined CHF, Bradycardia - BP improved with Bidil addition - He did have some bradycardia this is likely due to the metoprolol (clonidine may have contributed), will D/C this for now - Overall volume status improved, however we are currently limited by his renal function.  Acute on chronic kidney disease - SCr trending up.  U/A has some proteinuria but otherwise bland.  Has been urinarting without difficulty but does have reported history of prostate CA, renal u/s is reasonable.

## 2017-08-21 ENCOUNTER — Inpatient Hospital Stay (HOSPITAL_COMMUNITY): Payer: Medicare Other | Admitting: Anesthesiology

## 2017-08-21 ENCOUNTER — Inpatient Hospital Stay (HOSPITAL_COMMUNITY): Payer: Medicare Other

## 2017-08-21 ENCOUNTER — Encounter (HOSPITAL_COMMUNITY): Payer: Self-pay | Admitting: Anesthesiology

## 2017-08-21 ENCOUNTER — Encounter (HOSPITAL_COMMUNITY)
Admission: EM | Disposition: A | Discharge status: Home / Self Care | Payer: Self-pay | Source: Home / Self Care | Attending: Internal Medicine

## 2017-08-21 HISTORY — PX: CYSTOSCOPY W/ URETERAL STENT PLACEMENT: SHX1429

## 2017-08-21 LAB — RENAL FUNCTION PANEL
ALBUMIN: 2.6 g/dL — AB (ref 3.5–5.0)
ANION GAP: 13 (ref 5–15)
BUN: 68 mg/dL — ABNORMAL HIGH (ref 6–20)
CALCIUM: 8.3 mg/dL — AB (ref 8.9–10.3)
CO2: 23 mmol/L (ref 22–32)
Chloride: 97 mmol/L — ABNORMAL LOW (ref 101–111)
Creatinine, Ser: 6.44 mg/dL — ABNORMAL HIGH (ref 0.61–1.24)
GFR calc non Af Amer: 7 mL/min — ABNORMAL LOW (ref >60)
GFR, EST AFRICAN AMERICAN: 8 mL/min — AB (ref >60)
GLUCOSE: 106 mg/dL — AB (ref 65–99)
PHOSPHORUS: 6.4 mg/dL — AB (ref 2.5–4.6)
POTASSIUM: 4.1 mmol/L (ref 3.5–5.1)
Sodium: 133 mmol/L — ABNORMAL LOW (ref 135–145)

## 2017-08-21 LAB — CBC
HEMATOCRIT: 26.9 % — AB (ref 39.0–52.0)
HEMOGLOBIN: 8.8 g/dL — AB (ref 13.0–17.0)
MCH: 29.8 pg (ref 26.0–34.0)
MCHC: 32.7 g/dL (ref 30.0–36.0)
MCV: 91.2 fL (ref 78.0–100.0)
Platelets: 167 10*3/uL (ref 150–400)
RBC: 2.95 MIL/uL — AB (ref 4.22–5.81)
RDW: 12.2 % (ref 11.5–15.5)
WBC: 6.3 10*3/uL (ref 4.0–10.5)

## 2017-08-21 LAB — CULTURE, BLOOD (ROUTINE X 2)
CULTURE: NO GROWTH
Culture: NO GROWTH
SPECIAL REQUESTS: ADEQUATE
SPECIAL REQUESTS: ADEQUATE

## 2017-08-21 LAB — HEPATIC FUNCTION PANEL
ALBUMIN: 2.6 g/dL — AB (ref 3.5–5.0)
ALK PHOS: 51 U/L (ref 38–126)
ALT: 16 U/L — ABNORMAL LOW (ref 17–63)
AST: 18 U/L (ref 15–41)
BILIRUBIN TOTAL: 0.4 mg/dL (ref 0.3–1.2)
Bilirubin, Direct: <0.1 mg/dL — ABNORMAL LOW (ref 0.1–0.5)
TOTAL PROTEIN: 6.3 g/dL — AB (ref 6.5–8.1)

## 2017-08-21 LAB — PTH, INTACT AND CALCIUM
Calcium, Total (PTH): 8.2 mg/dL — ABNORMAL LOW (ref 8.6–10.2)
PTH: 162 pg/mL — AB (ref 15–65)

## 2017-08-21 SURGERY — CYSTOSCOPY, WITH RETROGRADE PYELOGRAM AND URETERAL STENT INSERTION
Anesthesia: General | Site: Bladder | Laterality: Left

## 2017-08-21 MED ORDER — LIDOCAINE HCL 2 % EX GEL
CUTANEOUS | Status: AC
  Filled 2017-08-21: qty 20

## 2017-08-21 MED ORDER — ONDANSETRON HCL 4 MG/2ML IJ SOLN
INTRAMUSCULAR | Status: DC | PRN
  Administered 2017-08-21: 4 mg via INTRAVENOUS

## 2017-08-21 MED ORDER — PHENAZOPYRIDINE HCL 200 MG PO TABS: 200.0000 mg | ORAL_TABLET | Freq: Three times a day (TID) | ORAL | 0 refills | Status: DC | PRN

## 2017-08-21 MED ORDER — SODIUM CHLORIDE 0.9 % IV SOLN
INTRAVENOUS | Status: DC
  Administered 2017-08-21 (×2): via INTRAVENOUS

## 2017-08-21 MED ORDER — CALCITRIOL 0.25 MCG PO CAPS
0.2500 ug | ORAL_CAPSULE | Freq: Every day | ORAL | Status: DC
  Administered 2017-08-21 – 2017-08-25 (×5): 0.25 ug via ORAL
  Filled 2017-08-21 (×5): qty 1

## 2017-08-21 MED ORDER — FENTANYL CITRATE (PF) 100 MCG/2ML IJ SOLN
INTRAMUSCULAR | Status: DC | PRN
  Administered 2017-08-21 (×2): 25 ug via INTRAVENOUS

## 2017-08-21 MED ORDER — FERUMOXYTOL INJECTION 510 MG/17 ML
510.0000 mg | Freq: Once | INTRAVENOUS | Status: AC
  Administered 2017-08-21: 510 mg via INTRAVENOUS
  Filled 2017-08-21: qty 17

## 2017-08-21 MED ORDER — PROPOFOL 10 MG/ML IV BOLUS
INTRAVENOUS | Status: AC
  Filled 2017-08-21: qty 20

## 2017-08-21 MED ORDER — FENTANYL CITRATE (PF) 250 MCG/5ML IJ SOLN
INTRAMUSCULAR | Status: AC
  Filled 2017-08-21: qty 5

## 2017-08-21 MED ORDER — HYDROCODONE-ACETAMINOPHEN 5-325 MG PO TABS: 1.0000 | ORAL_TABLET | ORAL | 0 refills | Status: DC | PRN

## 2017-08-21 MED ORDER — LIDOCAINE HCL (CARDIAC) 20 MG/ML IV SOLN
INTRAVENOUS | Status: DC | PRN
  Administered 2017-08-21: 40 mg via INTRAVENOUS

## 2017-08-21 MED ORDER — CEFAZOLIN SODIUM-DEXTROSE 2-4 GM/100ML-% IV SOLN
2.0000 g | Freq: Once | INTRAVENOUS | Status: AC
  Administered 2017-08-21: 2 g via INTRAVENOUS
  Filled 2017-08-21: qty 100

## 2017-08-21 MED ORDER — PROPOFOL 10 MG/ML IV BOLUS
INTRAVENOUS | Status: DC | PRN
  Administered 2017-08-21: 130 mg via INTRAVENOUS

## 2017-08-21 MED ORDER — IOPAMIDOL (ISOVUE-300) INJECTION 61%
INTRAVENOUS | Status: DC | PRN
  Administered 2017-08-21: 15 mL via URETHRAL

## 2017-08-21 MED ORDER — IOPAMIDOL (ISOVUE-300) INJECTION 61%
INTRAVENOUS | Status: AC
  Filled 2017-08-21: qty 50

## 2017-08-21 MED ORDER — DEXAMETHASONE SODIUM PHOSPHATE 10 MG/ML IJ SOLN
INTRAMUSCULAR | Status: DC | PRN
  Administered 2017-08-21: 5 mg via INTRAVENOUS

## 2017-08-21 MED ORDER — STERILE WATER FOR IRRIGATION IR SOLN
Status: DC | PRN
  Administered 2017-08-21: 1500 mL

## 2017-08-21 MED ORDER — SODIUM CHLORIDE 0.9 % IR SOLN
Status: DC | PRN
  Administered 2017-08-21: 1000 mL
  Administered 2017-08-21: 3000 mL

## 2017-08-21 SURGICAL SUPPLY — 34 items
ADAPTER CATH URET PLST 4-6FR (CATHETERS) IMPLANT
ADPR CATH URET STRL DISP 4-6FR (CATHETERS)
APL SKNCLS STERI-STRIP NONHPOA (GAUZE/BANDAGES/DRESSINGS)
BAG URINE DRAINAGE (UROLOGICAL SUPPLIES) IMPLANT
BAG URO CATCHER STRL LF (MISCELLANEOUS) ×3 IMPLANT
BENZOIN TINCTURE PRP APPL 2/3 (GAUZE/BANDAGES/DRESSINGS) IMPLANT
BLADE 10 SAFETY STRL DISP (BLADE) ×3 IMPLANT
BUCKET BIOHAZARD WASTE 5 GAL (MISCELLANEOUS) ×3 IMPLANT
CATH FOLEY 2WAY SLVR  5CC 16FR (CATHETERS)
CATH FOLEY 2WAY SLVR 5CC 16FR (CATHETERS) IMPLANT
CATH INTERMIT  6FR 70CM (CATHETERS) IMPLANT
CATH URET 5FR 28IN CONE TIP (BALLOONS)
CATH URET 5FR 28IN OPEN ENDED (CATHETERS) ×3 IMPLANT
CATH URET 5FR 70CM CONE TIP (BALLOONS) IMPLANT
COVER SURGICAL LIGHT HANDLE (MISCELLANEOUS) ×3 IMPLANT
DRAPE CAMERA CLOSED 9X96 (DRAPES) ×3 IMPLANT
GLOVE BIO SURGEON STRL SZ7.5 (GLOVE) ×3 IMPLANT
GOWN STRL REUS W/ TWL XL LVL3 (GOWN DISPOSABLE) ×2 IMPLANT
GOWN STRL REUS W/TWL XL LVL3 (GOWN DISPOSABLE) ×6
GUIDEWIRE ANG ZIPWIRE 038X150 (WIRE) ×3 IMPLANT
GUIDEWIRE COOK  .035 (WIRE) IMPLANT
GUIDEWIRE STR DUAL SENSOR (WIRE) IMPLANT
KIT ROOM TURNOVER OR (KITS) ×3 IMPLANT
MANIFOLD NEPTUNE II (INSTRUMENTS) IMPLANT
NS IRRIG 1000ML POUR BTL (IV SOLUTION) ×3 IMPLANT
PACK CYSTO (CUSTOM PROCEDURE TRAY) ×3 IMPLANT
PAD ARMBOARD 7.5X6 YLW CONV (MISCELLANEOUS) ×6 IMPLANT
PLUG CATH AND CAP STER (CATHETERS) IMPLANT
STENT URET 6FRX24 CONTOUR (STENTS) IMPLANT
SYR CONTROL 10ML LL (SYRINGE) ×3 IMPLANT
SYRINGE TOOMEY DISP (SYRINGE) IMPLANT
UNDERPAD 30X30 (UNDERPADS AND DIAPERS) ×3 IMPLANT
WATER STERILE IRR 1000ML POUR (IV SOLUTION) ×3 IMPLANT
WIRE COONS/BENSON .038X145CM (WIRE) IMPLANT

## 2017-08-21 NOTE — H&P (View-Only) (Signed)
Urology Consult   Physician requesting consult: Lorella Nimrod, MD  Reason for consult: Left hydronephrosis   History of Present Illness: [name] [name] is a 83 y.o. who is currently being evaluated for CHF, acute on chronic renal failure and acute respiratory failure.  Urology was consulted to evaluate the patient after he was found to have left sided hydronephrosis on RUS today along with progressively worsening renal function.  He denies flank pain, nausea/vomiting or fever/chills.  He has a prior history of prostate cancer treated with radiation in 1999 (Last PSA- 1.7 02/23/17).     He denies a history of voiding or storage urinary symptoms, hematuria, UTIs, STDs, urolithiasis.  Past Medical History:  Diagnosis Date  . CHF (congestive heart failure) ([place])   . Heart murmur   . History of gout ~ 1998  . Hyperlipidemia   . Hypertension   . Prostate cancer ([place]) ~ 1998   S/P radiation    Past Surgical History:  Procedure Laterality Date  . CATARACT EXTRACTION W/ INTRAOCULAR LENS  IMPLANT, BILATERAL Bilateral     Current Hospital Medications:  Home Meds:  Current Meds  Medication Sig  . brimonidine (ALPHAGAN) 0.2 % ophthalmic solution Place 1 drop into both eyes 2 (two) times daily.  Marland Kitchen latanoprost (XALATAN) 0.005 % ophthalmic solution Place 1 drop into both eyes at bedtime.   Marland Kitchen lisinopril (PRINIVIL,ZESTRIL) 40 MG tablet TAKE 1 TABLET (40 MG TOTAL) BY MOUTH DAILY.  Marland Kitchen NIFEdipine (PROCARDIA XL/ADALAT-CC) 90 MG 24 hr tablet TAKE 1 TABLET (90 MG TOTAL) BY MOUTH DAILY.  . simvastatin (ZOCOR) 10 MG tablet TAKE 1 TABLET (10 MG TOTAL) BY MOUTH DAILY AT 6 PM.  . timolol (TIMOPTIC) 0.5 % ophthalmic solution Place 1 drop into both eyes daily.    Scheduled Meds: . aspirin EC  81 mg Oral Daily  . brimonidine  1 drop Both Eyes BID  . calcitRIOL  0.25 mcg Oral Daily  . cloNIDine  0.1 mg Oral TID  . heparin  5,000 Units Subcutaneous Q8H  . isosorbide-hydrALAZINE  1 tablet Oral TID  .  latanoprost  1 drop Both Eyes QHS  . sodium chloride flush  3 mL Intravenous Q12H  . timolol  1 drop Both Eyes Daily   Continuous Infusions: . sodium chloride    .  ceFAZolin (ANCEF) IV    . ferumoxytol     PRN Meds:.sodium chloride, acetaminophen, hydrALAZINE, ondansetron (ZOFRAN) IV, sodium chloride flush  Allergies: No Known Allergies  Family History  Problem Relation Age of Onset  . Hypertension Father   . Hypertension Sister   . Hypertension Brother     Social History:  reports that he quit smoking about 20 years ago. His smoking use included Cigarettes. He has a 46.00 pack-year smoking history. He has never used smokeless tobacco. He reports that he does not drink alcohol or use drugs.  ROS: A complete review of systems was performed.  All systems are negative except for pertinent findings as noted.  Physical Exam:  Vital signs in last 24 hours: Temp:  [97.7 F (36.5 C)-98.4 F (36.9 C)] 98.4 F (36.9 C) (10/27 0959) Pulse Rate:  [51-93] 93 (10/27 0959) Resp:  [18] 18 (10/27 0959) BP: (142-186)/(66-89) 169/74 (10/27 0959) SpO2:  [92 %-99 %] 98 % (10/27 0959) Weight:  [84.1 kg (185 lb 8 oz)] 84.1 kg (185 lb 8 oz) (10/26 2204) Constitutional:  Alert and oriented, No acute distress Cardiovascular: Regular rate and rhythm, No JVD Respiratory: Normal respiratory effort,  Lungs clear bilaterally GI: Abdomen is soft, nontender, nondistended, no abdominal masses GU: No CVA tenderness, Foley in place and draining yellow urine.  The penis is uncircumcised with no masses or lesion.  Testicles are descended with no masses or lesions Lymphatic: No lymphadenopathy Neurologic: Grossly intact, no focal deficits Psychiatric: Normal mood and affect  Laboratory Data:   Recent Labs  08/21/17 0516  WBC 6.3  HGB 8.8*  HCT 26.9*  PLT 167     Recent Labs  08/19/17 0247 08/20/17 0327 08/20/17 1425 08/21/17 0516  NA 137 135  --  133*  K 3.8 4.2  --  4.1  CL 100* 97*  --   97*  GLUCOSE 100* 101*  --  106*  BUN 45* 59*  --  68*  CALCIUM 8.4* 8.4* 8.2* 8.3*  CREATININE 4.20* 5.62*  --  6.44*     Results for orders placed or performed during the hospital encounter of 08/16/17 (from the past 24 hour(s))  Iron and TIBC     Status: Abnormal   Collection Time: 08/20/17  2:25 PM  Result Value Ref Range   Iron 22 (L) 45 - 182 ug/dL   TIBC 190 (L) 250 - 450 ug/dL   Saturation Ratios 12 (L) 17.9 - 39.5 %   UIBC 168 ug/dL  Ferritin     Status: None   Collection Time: 08/20/17  2:25 PM  Result Value Ref Range   Ferritin 235 24 - 336 ng/mL  PTH, intact and calcium     Status: Abnormal   Collection Time: 08/20/17  2:25 PM  Result Value Ref Range   PTH 162 (H) 15 - 65 pg/mL   Calcium, Total (PTH) 8.2 (L) 8.6 - 10.2 mg/dL   PTH Interp Comment   Phosphorus     Status: Abnormal   Collection Time: 08/20/17  2:25 PM  Result Value Ref Range   Phosphorus 6.1 (H) 2.5 - 4.6 mg/dL  Renal function panel     Status: Abnormal   Collection Time: 08/21/17  5:16 AM  Result Value Ref Range   Sodium 133 (L) 135 - 145 mmol/L   Potassium 4.1 3.5 - 5.1 mmol/L   Chloride 97 (L) 101 - 111 mmol/L   CO2 23 22 - 32 mmol/L   Glucose, Bld 106 (H) 65 - 99 mg/dL   BUN 68 (H) 6 - 20 mg/dL   Creatinine, Ser 6.44 (H) 0.61 - 1.24 mg/dL   Calcium 8.3 (L) 8.9 - 10.3 mg/dL   Phosphorus 6.4 (H) 2.5 - 4.6 mg/dL   Albumin 2.6 (L) 3.5 - 5.0 g/dL   GFR calc non Af Amer 7 (L) >60 mL/min   GFR calc Af Amer 8 (L) >60 mL/min   Anion gap 13 5 - 15  CBC     Status: Abnormal   Collection Time: 08/21/17  5:16 AM  Result Value Ref Range   WBC 6.3 4.0 - 10.5 K/uL   RBC 2.95 (L) 4.22 - 5.81 MIL/uL   Hemoglobin 8.8 (L) 13.0 - 17.0 g/dL   HCT 26.9 (L) 39.0 - 52.0 %   MCV 91.2 78.0 - 100.0 fL   MCH 29.8 26.0 - 34.0 pg   MCHC 32.7 30.0 - 36.0 g/dL   RDW 12.2 11.5 - 15.5 %   Platelets 167 150 - 400 K/uL  Hepatic function panel     Status: Abnormal   Collection Time: 08/21/17  9:47 AM  Result Value  Ref Range   Total Protein  6.3 (L) 6.5 - 8.1 g/dL   Albumin 2.6 (L) 3.5 - 5.0 g/dL   AST 18 15 - 41 U/L   ALT 16 (L) 17 - 63 U/L   Alkaline Phosphatase 51 38 - 126 U/L   Total Bilirubin 0.4 0.3 - 1.2 mg/dL   Bilirubin, Direct <0.1 (L) 0.1 - 0.5 mg/dL   Indirect Bilirubin NOT CALCULATED 0.3 - 0.9 mg/dL   Recent Results (from the past 240 hour(s))  Blood culture (routine x 2)     Status: None   Collection Time: 08/16/17  1:25 AM  Result Value Ref Range Status   Specimen Description BLOOD RIGHT ANTECUBITAL  Final   Special Requests   Final    BOTTLES DRAWN AEROBIC AND ANAEROBIC Blood Culture adequate volume   Culture NO GROWTH 5 DAYS  Final   Report Status 08/21/2017 FINAL  Final  Blood culture (routine x 2)     Status: None   Collection Time: 08/16/17  1:41 AM  Result Value Ref Range Status   Specimen Description BLOOD LEFT ANTECUBITAL  Final   Special Requests   Final    BOTTLES DRAWN AEROBIC AND ANAEROBIC Blood Culture adequate volume   Culture NO GROWTH 5 DAYS  Final   Report Status 08/21/2017 FINAL  Final    Renal Function:  Recent Labs  08/16/17 0004 08/16/17 0703 08/17/17 0817 08/18/17 0314 08/19/17 0247 08/20/17 0327 08/21/17 0516  CREATININE 2.92* 2.79* 3.15* 3.55* 4.20* 5.62* 6.44*   Estimated Creatinine Clearance: 9.8 mL/min (A) (by C-G formula based on SCr of 6.44 mg/dL (H)).  Radiologic Imaging: US Renal  Result Date: 08/21/2017 CLINICAL DATA:  83 year old male with acute renal insufficiency. History of hypertension, CHF. EXAM: RENAL / URINARY TRACT ULTRASOUND COMPLETE COMPARISON:  Abdominal CT dated 11/29/2009 and MRI dated 12/15/2010 FINDINGS: Right Kidney: Length: 11.3 cm. There is increased renal echogenicity. There is no hydronephrosis or echogenic stone. An 8 x 6 x 8 mm cyst in the anterior interpolar kidney. Left Kidney: Length: 12.3 cm. Diffuse increased echogenicity. There is a moderate right hydronephrosis. There is a 4 x 4 x 3 cm upper pole cyst. A  3.6 x 4.1 x 4.6 cm complex cyst with internal septation noted in the inferior pole. No flow identified within the internal septation. Follow-up with ultrasound in 3 months or further characterization with MRI recommended. Bladder: The urinary bladder is decompressed around a Foley catheter. IMPRESSION: 1. Increased renal echogenicity in keeping with medical renal disease. Clinical correlation is recommended. 2. Moderate left hydronephrosis.  No stone identified. 3. Septated left renal inferior pole cyst. Follow-up with ultrasound in 3 months recommended. Electronically Signed   By: Anner Crete M.D.   On: 08/21/2017 01:19    I independently reviewed the above imaging studies.  Impression/Recommendation  1.  Left hydronephrosis 2.  Acute on chronic renal failure- worsening GFR and creatinine despite bladder decompression.  Not dialysis dependent 3.  History of prostate cancer s/p radiation in 1999, stable PSA  - The risks, benefits and alternatives of cystoscopy with left retrograde pyelogram and left ureteral stent placement was discussed with the patient.  He voices understanding and wishes to proceed. -NPO -Ancef 2 g IV pre-op hour  Conception Oms Winter, MD 08/21/2017, 12:20 PM

## 2017-08-21 NOTE — Progress Notes (Signed)
S: 83 year old gentleman with history of prostate cancer, hypertension, CKD, CHF and rapidly worsening renal function.  Patient was started on 2 L of oxygen last night, according to patient he feels having mild dyspnea, no recorded hypoxia.Patient was saturating well. He denies any more shortness of breath or chest pain.  O:BP (!) 169/74 (BP Location: Left Arm)   Pulse 93   Temp 98.4 F (36.9 C) (Oral)   Resp 18   Ht 6\' 2"  (1.88 m)   Wt 185 lb 8 oz (84.1 kg)   SpO2 98%   BMI 23.82 kg/m   Intake/Output Summary (Last 24 hours) at 08/21/17 1026 Last data filed at 08/21/17 0900  Gross per 24 hour  Intake              603 ml  Output              402 ml  Net              201 ml   Intake/Output: I/O last 3 completed shifts: In: 10 [P.O.:700; I.V.:3] Out: 550 [Urine:550]  Intake/Output this shift:  Total I/O In: 240 [P.O.:240] Out: 2 [Urine:1; Stool:1] Weight change: 15.6 oz (0.442 kg) Gen: well-developed elderly man, in no acute distress. CVS: regular rate and rhythm. Gr 2/6 M Resp: Bilaterally decreased basal air entry with few basal crackles. Abd: soft, non tender, bowel sounds positive. Ext: trace pedal edema bilaterally.   Recent Labs Lab 08/16/17 0004 08/16/17 0703 08/17/17 0817 08/18/17 0314 08/19/17 0247 08/20/17 0327 08/20/17 1425 08/21/17 0516  NA 135 137 138 137 137 135  --  133*  K 4.3 4.2 3.9 4.0 3.8 4.2  --  4.1  CL 105 102 101 99* 100* 97*  --  97*  CO2 22 25 27 28 27 26   --  23  GLUCOSE 137* 95 92 90 100* 101*  --  106*  BUN 27* 25* 29* 39* 45* 59*  --  68*  CREATININE 2.92* 2.79* 3.15* 3.55* 4.20* 5.62*  --  6.44*  ALBUMIN 3.6  --   --   --   --   --   --  2.6*  CALCIUM 8.7* 8.4* 8.8* 8.5* 8.4* 8.4* 8.2* 8.3*  PHOS  --   --   --   --   --   --  6.1* 6.4*  AST 17  --   --   --   --   --   --   --   ALT 11*  --   --   --   --   --   --   --    Liver Function Tests:  Recent Labs Lab 08/16/17 0004 08/21/17 0516  AST 17  --   ALT 11*  --    ALKPHOS 70  --   BILITOT 0.7  --   PROT 8.0  --   ALBUMIN 3.6 2.6*   No results for input(s): LIPASE, AMYLASE in the last 168 hours. No results for input(s): AMMONIA in the last 168 hours. CBC:  Recent Labs Lab 08/16/17 0004 08/17/17 0817 08/21/17 0516  WBC 8.7 4.9 6.3  NEUTROABS 7.2  --   --   HGB 11.6* 10.4* 8.8*  HCT 35.5* 32.6* 26.9*  MCV 91.3 92.4 91.2  PLT 202 166 167   Cardiac Enzymes: No results for input(s): CKTOTAL, CKMB, CKMBINDEX, TROPONINI in the last 168 hours. CBG: No results for input(s): GLUCAP in the last 168 hours.  Iron Studies:  Recent Labs  08/20/17 1425  IRON 22*  TIBC 190*  FERRITIN 235   Studies/Results: US Renal  Result Date: 08/21/2017 CLINICAL DATA:  83 year old male with acute renal insufficiency. History of hypertension, CHF. EXAM: RENAL / URINARY TRACT ULTRASOUND COMPLETE COMPARISON:  Abdominal CT dated 11/29/2009 and MRI dated 12/15/2010 FINDINGS: Right Kidney: Length: 11.3 cm. There is increased renal echogenicity. There is no hydronephrosis or echogenic stone. An 8 x 6 x 8 mm cyst in the anterior interpolar kidney. Left Kidney: Length: 12.3 cm. Diffuse increased echogenicity. There is a moderate right hydronephrosis. There is a 4 x 4 x 3 cm upper pole cyst. A 3.6 x 4.1 x 4.6 cm complex cyst with internal septation noted in the inferior pole. No flow identified within the internal septation. Follow-up with ultrasound in 3 months or further characterization with MRI recommended. Bladder: The urinary bladder is decompressed around a Foley catheter. IMPRESSION: 1. Increased renal echogenicity in keeping with medical renal disease. Clinical correlation is recommended. 2. Moderate left hydronephrosis.  No stone identified. 3. Septated left renal inferior pole cyst. Follow-up with ultrasound in 3 months recommended. Electronically Signed   By: Anner Crete M.D.   On: 08/21/2017 01:19   . aspirin EC  81 mg Oral Daily  . brimonidine  1 drop Both  Eyes BID  . cloNIDine  0.1 mg Oral TID  . heparin  5,000 Units Subcutaneous Q8H  . isosorbide-hydrALAZINE  1 tablet Oral TID  . latanoprost  1 drop Both Eyes QHS  . sodium chloride flush  3 mL Intravenous Q12H  . timolol  1 drop Both Eyes Daily    BMET    Component Value Date/Time   NA 133 (L) 08/21/2017 0516   NA 140 02/23/2017 1531   K 4.1 08/21/2017 0516   CL 97 (L) 08/21/2017 0516   CO2 23 08/21/2017 0516   GLUCOSE 106 (H) 08/21/2017 0516   BUN 68 (H) 08/21/2017 0516   BUN 26 02/23/2017 1531   CREATININE 6.44 (H) 08/21/2017 0516   CREATININE 1.54 (H) 07/03/2014 1409   CALCIUM 8.3 (L) 08/21/2017 0516   CALCIUM 8.2 (L) 08/20/2017 1425   GFRNONAA 7 (L) 08/21/2017 0516   GFRNONAA 41 (L) 09/05/2013 1530   GFRAA 8 (L) 08/21/2017 0516   GFRAA 47 (L) 09/05/2013 1530   CBC    Component Value Date/Time   WBC 6.3 08/21/2017 0516   RBC 2.95 (L) 08/21/2017 0516   HGB 8.8 (L) 08/21/2017 0516   HGB 12.7 (L) 02/23/2017 1531   HCT 26.9 (L) 08/21/2017 0516   HCT 37.5 02/23/2017 1531   PLT 167 08/21/2017 0516   PLT 268 02/23/2017 1531   MCV 91.2 08/21/2017 0516   MCV 91 02/23/2017 1531   MCH 29.8 08/21/2017 0516   MCHC 32.7 08/21/2017 0516   RDW 12.2 08/21/2017 0516   RDW 12.7 02/23/2017 1531   LYMPHSABS 0.6 (L) 08/16/2017 0004   LYMPHSABS 1.4 02/23/2017 1531   MONOABS 0.8 08/16/2017 0004   EOSABS 0.1 08/16/2017 0004   EOSABS 0.1 02/23/2017 1531   BASOSABS 0.0 08/16/2017 0004   BASOSABS 0.1 02/23/2017 1531     Assessment/Plan:  1. AKI with CKD. And patient has rapidly worsening renal function with GFR of 8 today. Creatinine at 6.44. No signs of uremia. Reduced urinary output, despite being on Foley. Renal ultrasound shows increased renal echogenicity due to chronic kidney disease, left-sided hydronephrosis, and a left-sided septated cyst which will need follow-up in 3 month. -we strongly  suspect that he has post renal etiology most likely due to obstruction, especially  with his history of prostate cancer. -he will need an urgent urology consult- primary team notified. L hydro but suspect R sided obstruction also with his rise in Cr  2. HFrEF. Patient remained asymptomatic with no hypoxia or shortness of breath. Patient was little bradycardic today. He does not appear very volume overload, or hypoxic. -discontinue metoprolol. -No need for Lasix at this time.  3. Hypertension. Blood pressure remained elevated with no orthostasis. Heart rate improved today with discontinuation of metoprolol. -Continue current management, we anticipate improvement with removal of obstruction.  4. Anemia. Hemoglobin decreasing with no obvious sign of bleeding. UA without any hematuria. -studies consistent with iron deficiency anemia. -Check FOBT -Feraheme  5.Metabolic bone disease. Parathyroid that was elevated at 162, due to CK D. - calcitriol 0.25 mcg daily. 6 Prostate Ca    I have seen and examined this patient and agree with the plan of care seen, eval, examined, discussed with resident .  Matalyn Nawaz L 08/21/2017, 4:42 PM

## 2017-08-21 NOTE — Consult Note (Signed)
Urology Consult   Physician requesting consult: Lorella Nimrod, MD  Reason for consult: Left hydronephrosis   History of Present Illness: [name] [name] is a 83 y.o. who is currently being evaluated for CHF, acute on chronic renal failure and acute respiratory failure.  Urology was consulted to evaluate the patient after he was found to have left sided hydronephrosis on RUS today along with progressively worsening renal function.  He denies flank pain, nausea/vomiting or fever/chills.  He has a prior history of prostate cancer treated with radiation in 1999 (Last PSA- 1.7 02/23/17).     He denies a history of voiding or storage urinary symptoms, hematuria, UTIs, STDs, urolithiasis.  Past Medical History:  Diagnosis Date  . CHF (congestive heart failure) ([place])   . Heart murmur   . History of gout ~ 1998  . Hyperlipidemia   . Hypertension   . Prostate cancer ([place]) ~ 1998   S/P radiation    Past Surgical History:  Procedure Laterality Date  . CATARACT EXTRACTION W/ INTRAOCULAR LENS  IMPLANT, BILATERAL Bilateral     Current Hospital Medications:  Home Meds:  Current Meds  Medication Sig  . brimonidine (ALPHAGAN) 0.2 % ophthalmic solution Place 1 drop into both eyes 2 (two) times daily.  Marland Kitchen latanoprost (XALATAN) 0.005 % ophthalmic solution Place 1 drop into both eyes at bedtime.   Marland Kitchen lisinopril (PRINIVIL,ZESTRIL) 40 MG tablet TAKE 1 TABLET (40 MG TOTAL) BY MOUTH DAILY.  Marland Kitchen NIFEdipine (PROCARDIA XL/ADALAT-CC) 90 MG 24 hr tablet TAKE 1 TABLET (90 MG TOTAL) BY MOUTH DAILY.  . simvastatin (ZOCOR) 10 MG tablet TAKE 1 TABLET (10 MG TOTAL) BY MOUTH DAILY AT 6 PM.  . timolol (TIMOPTIC) 0.5 % ophthalmic solution Place 1 drop into both eyes daily.    Scheduled Meds: . aspirin EC  81 mg Oral Daily  . brimonidine  1 drop Both Eyes BID  . calcitRIOL  0.25 mcg Oral Daily  . cloNIDine  0.1 mg Oral TID  . heparin  5,000 Units Subcutaneous Q8H  . isosorbide-hydrALAZINE  1 tablet Oral TID  .  latanoprost  1 drop Both Eyes QHS  . sodium chloride flush  3 mL Intravenous Q12H  . timolol  1 drop Both Eyes Daily   Continuous Infusions: . sodium chloride    .  ceFAZolin (ANCEF) IV    . ferumoxytol     PRN Meds:.sodium chloride, acetaminophen, hydrALAZINE, ondansetron (ZOFRAN) IV, sodium chloride flush  Allergies: No Known Allergies  Family History  Problem Relation Age of Onset  . Hypertension Father   . Hypertension Sister   . Hypertension Brother     Social History:  reports that he quit smoking about 20 years ago. His smoking use included Cigarettes. He has a 46.00 pack-year smoking history. He has never used smokeless tobacco. He reports that he does not drink alcohol or use drugs.  ROS: A complete review of systems was performed.  All systems are negative except for pertinent findings as noted.  Physical Exam:  Vital signs in last 24 hours: Temp:  [97.7 F (36.5 C)-98.4 F (36.9 C)] 98.4 F (36.9 C) (10/27 0959) Pulse Rate:  [51-93] 93 (10/27 0959) Resp:  [18] 18 (10/27 0959) BP: (142-186)/(66-89) 169/74 (10/27 0959) SpO2:  [92 %-99 %] 98 % (10/27 0959) Weight:  [84.1 kg (185 lb 8 oz)] 84.1 kg (185 lb 8 oz) (10/26 2204) Constitutional:  Alert and oriented, No acute distress Cardiovascular: Regular rate and rhythm, No JVD Respiratory: Normal respiratory effort,  Lungs clear bilaterally GI: Abdomen is soft, nontender, nondistended, no abdominal masses GU: No CVA tenderness, Foley in place and draining yellow urine.  The penis is uncircumcised with no masses or lesion.  Testicles are descended with no masses or lesions Lymphatic: No lymphadenopathy Neurologic: Grossly intact, no focal deficits Psychiatric: Normal mood and affect  Laboratory Data:   Recent Labs  08/21/17 0516  WBC 6.3  HGB 8.8*  HCT 26.9*  PLT 167     Recent Labs  08/19/17 0247 08/20/17 0327 08/20/17 1425 08/21/17 0516  NA 137 135  --  133*  K 3.8 4.2  --  4.1  CL 100* 97*  --   97*  GLUCOSE 100* 101*  --  106*  BUN 45* 59*  --  68*  CALCIUM 8.4* 8.4* 8.2* 8.3*  CREATININE 4.20* 5.62*  --  6.44*     Results for orders placed or performed during the hospital encounter of 08/16/17 (from the past 24 hour(s))  Iron and TIBC     Status: Abnormal   Collection Time: 08/20/17  2:25 PM  Result Value Ref Range   Iron 22 (L) 45 - 182 ug/dL   TIBC 190 (L) 250 - 450 ug/dL   Saturation Ratios 12 (L) 17.9 - 39.5 %   UIBC 168 ug/dL  Ferritin     Status: None   Collection Time: 08/20/17  2:25 PM  Result Value Ref Range   Ferritin 235 24 - 336 ng/mL  PTH, intact and calcium     Status: Abnormal   Collection Time: 08/20/17  2:25 PM  Result Value Ref Range   PTH 162 (H) 15 - 65 pg/mL   Calcium, Total (PTH) 8.2 (L) 8.6 - 10.2 mg/dL   PTH Interp Comment   Phosphorus     Status: Abnormal   Collection Time: 08/20/17  2:25 PM  Result Value Ref Range   Phosphorus 6.1 (H) 2.5 - 4.6 mg/dL  Renal function panel     Status: Abnormal   Collection Time: 08/21/17  5:16 AM  Result Value Ref Range   Sodium 133 (L) 135 - 145 mmol/L   Potassium 4.1 3.5 - 5.1 mmol/L   Chloride 97 (L) 101 - 111 mmol/L   CO2 23 22 - 32 mmol/L   Glucose, Bld 106 (H) 65 - 99 mg/dL   BUN 68 (H) 6 - 20 mg/dL   Creatinine, Ser 6.44 (H) 0.61 - 1.24 mg/dL   Calcium 8.3 (L) 8.9 - 10.3 mg/dL   Phosphorus 6.4 (H) 2.5 - 4.6 mg/dL   Albumin 2.6 (L) 3.5 - 5.0 g/dL   GFR calc non Af Amer 7 (L) >60 mL/min   GFR calc Af Amer 8 (L) >60 mL/min   Anion gap 13 5 - 15  CBC     Status: Abnormal   Collection Time: 08/21/17  5:16 AM  Result Value Ref Range   WBC 6.3 4.0 - 10.5 K/uL   RBC 2.95 (L) 4.22 - 5.81 MIL/uL   Hemoglobin 8.8 (L) 13.0 - 17.0 g/dL   HCT 26.9 (L) 39.0 - 52.0 %   MCV 91.2 78.0 - 100.0 fL   MCH 29.8 26.0 - 34.0 pg   MCHC 32.7 30.0 - 36.0 g/dL   RDW 12.2 11.5 - 15.5 %   Platelets 167 150 - 400 K/uL  Hepatic function panel     Status: Abnormal   Collection Time: 08/21/17  9:47 AM  Result Value  Ref Range   Total Protein  6.3 (L) 6.5 - 8.1 g/dL   Albumin 2.6 (L) 3.5 - 5.0 g/dL   AST 18 15 - 41 U/L   ALT 16 (L) 17 - 63 U/L   Alkaline Phosphatase 51 38 - 126 U/L   Total Bilirubin 0.4 0.3 - 1.2 mg/dL   Bilirubin, Direct <0.1 (L) 0.1 - 0.5 mg/dL   Indirect Bilirubin NOT CALCULATED 0.3 - 0.9 mg/dL   Recent Results (from the past 240 hour(s))  Blood culture (routine x 2)     Status: None   Collection Time: 08/16/17  1:25 AM  Result Value Ref Range Status   Specimen Description BLOOD RIGHT ANTECUBITAL  Final   Special Requests   Final    BOTTLES DRAWN AEROBIC AND ANAEROBIC Blood Culture adequate volume   Culture NO GROWTH 5 DAYS  Final   Report Status 08/21/2017 FINAL  Final  Blood culture (routine x 2)     Status: None   Collection Time: 08/16/17  1:41 AM  Result Value Ref Range Status   Specimen Description BLOOD LEFT ANTECUBITAL  Final   Special Requests   Final    BOTTLES DRAWN AEROBIC AND ANAEROBIC Blood Culture adequate volume   Culture NO GROWTH 5 DAYS  Final   Report Status 08/21/2017 FINAL  Final    Renal Function:  Recent Labs  08/16/17 0004 08/16/17 0703 08/17/17 0817 08/18/17 0314 08/19/17 0247 08/20/17 0327 08/21/17 0516  CREATININE 2.92* 2.79* 3.15* 3.55* 4.20* 5.62* 6.44*   Estimated Creatinine Clearance: 9.8 mL/min (A) (by C-G formula based on SCr of 6.44 mg/dL (H)).  Radiologic Imaging: US Renal  Result Date: 08/21/2017 CLINICAL DATA:  83 year old male with acute renal insufficiency. History of hypertension, CHF. EXAM: RENAL / URINARY TRACT ULTRASOUND COMPLETE COMPARISON:  Abdominal CT dated 11/29/2009 and MRI dated 12/15/2010 FINDINGS: Right Kidney: Length: 11.3 cm. There is increased renal echogenicity. There is no hydronephrosis or echogenic stone. An 8 x 6 x 8 mm cyst in the anterior interpolar kidney. Left Kidney: Length: 12.3 cm. Diffuse increased echogenicity. There is a moderate right hydronephrosis. There is a 4 x 4 x 3 cm upper pole cyst. A  3.6 x 4.1 x 4.6 cm complex cyst with internal septation noted in the inferior pole. No flow identified within the internal septation. Follow-up with ultrasound in 3 months or further characterization with MRI recommended. Bladder: The urinary bladder is decompressed around a Foley catheter. IMPRESSION: 1. Increased renal echogenicity in keeping with medical renal disease. Clinical correlation is recommended. 2. Moderate left hydronephrosis.  No stone identified. 3. Septated left renal inferior pole cyst. Follow-up with ultrasound in 3 months recommended. Electronically Signed   By: Anner Crete M.D.   On: 08/21/2017 01:19    I independently reviewed the above imaging studies.  Impression/Recommendation  1.  Left hydronephrosis 2.  Acute on chronic renal failure- worsening GFR and creatinine despite bladder decompression.  Not dialysis dependent 3.  History of prostate cancer s/p radiation in 1999, stable PSA  - The risks, benefits and alternatives of cystoscopy with left retrograde pyelogram and left ureteral stent placement was discussed with the patient.  He voices understanding and wishes to proceed. -NPO -Ancef 2 g IV pre-op hour  Conception Oms Macil Crady, MD 08/21/2017, 12:20 PM

## 2017-08-21 NOTE — Interval H&P Note (Signed)
History and Physical Interval Note:  08/21/2017 4:13 PM  [name] [name]  has presented today for surgery, with the diagnosis of prostate ca  The various methods of treatment have been discussed with the patient and family. After consideration of risks, benefits and other options for treatment, the patient has consented to  Procedure(s): [place] (Left) as a surgical intervention .  The patient's history has been reviewed, patient examined, no change in status, stable for surgery.  I have reviewed the patient's chart and labs.  Questions were answered to the patient's satisfaction.     [name]

## 2017-08-21 NOTE — Progress Notes (Signed)
Talked to [place] department regarding patient's renal US- Patient is stable and able to perform well.

## 2017-08-21 NOTE — Op Note (Signed)
Operative Note  Preoperative diagnosis:  1.  Left hydronephrosis 2.  Acute on chronic renal failure  Postoperative diagnosis: 1.  Left hydronephrosis 2.  Acute on chronic renal failure 3.  Obstructing 5 mm left midureteral stone  Procedure(s): 1.  Cystoscopy 2. Left retrograde pyelogram with intraoperative interpretation 3. Left 6 French JJ stent placement without tether  Surgeon: Ellison Hughs, MD  Assistants:  none  Anesthesia:  General LMA  Complications:  none  EBL:  Less than 5 mL  Specimens: 1. none  Drains/Catheters: 1.  6 French left JJ stent without tether 2. 16 French Foley catheter  Intraoperative findings:  Obstructing 5 mm left midureteral stone  Indication:  [name] [name] is a 83 y.o. male with worsening acute on chronic renal failure and newly diagnosed left-sided hydronephrosis seen on renal ultrasound from today.  He has been consented for the above procedures, voices understanding and wishes to proceed.  Description of procedure:  After informed consent was obtained, the patient was brought to the operating room and general LMA anesthesia was administered. The patient was then placed in the dorsolithotomy position and prepped and draped in usual sterile fashion. A timeout was performed. A 21 French rigid cystoscope was then inserted into the urethral meatus and advanced into the bladder under direct vision. A complete bladder survey revealed no intravesical pathology.  A 6 French open ended ureteral catheter was then inserted into the left ureteral orifice. A left retrograde pyelogram was obtained that showed a filling defect most consistent with an obstructing ureteral stone at the level of L5-S1. The Glidewire was then advanced through the lumen of the ureteral catheter and up to the left renal pelvis. The ureteral catheter was then advanced over the wire and beyond the level of the obstruction. A retrograde pyelogram was obtained of the  proximal left ureter and the left renal pelvis, which was negative for any filling defects or signs of proximal obstruction. The Glidewire was then replaced and advanced up to the left renal pelvis, under fluoroscopic guidance. A 6 [place] JJ stent was then placed over the wire and into good position within the left collecting system, confirming placement via fluoroscopy. The patient's bladder was left filled. A 16 French Foley catheter was replaced, return of clear irrigant. The catheter balloon was inflated with 10 mL of sterile water.  The patient tolerated the procedure well and was transferred to the postanesthesia unit in stable condition.  Plan:  Transfer patient back to the floor. Okay to resume diet from a GU standpoint. Will arrange outpatient follow-up for definitive stone treatment in the next 1-2 weeks.  Foley catheter drainage per primary team.

## 2017-08-21 NOTE — Anesthesia Preprocedure Evaluation (Addendum)
Anesthesia Evaluation  Patient identified by MRN, date of birth, ID band Patient awake    Reviewed: Allergy & Precautions, NPO status , Patient's Chart, lab work & pertinent test results  Airway Mallampati: II  TM Distance: >3 FB Neck ROM: Full    Dental  (+) Missing,    Pulmonary former smoker,    breath sounds clear to auscultation       Cardiovascular hypertension,  Rhythm:Regular Rate:Normal     Neuro/Psych    GI/Hepatic   Endo/Other    Renal/GU      Musculoskeletal   Abdominal   Peds  Hematology   Anesthesia Other Findings Patient ate dry toast 0900-0930 today.  Reproductive/Obstetrics                            Anesthesia Physical Anesthesia Plan  ASA: III  Anesthesia Plan: General   Post-op Pain Management:    Induction: Intravenous  PONV Risk Score and Plan: Ondansetron and Dexamethasone  Airway Management Planned: LMA  Additional Equipment:   Intra-op Plan:   Post-operative Plan:   Informed Consent: I have reviewed the patients History and Physical, chart, labs and discussed the procedure including the risks, benefits and alternatives for the proposed anesthesia with the patient or authorized representative who has indicated his/her understanding and acceptance.   Dental advisory given  Plan Discussed with: CRNA and Anesthesiologist  Anesthesia Plan Comments:         Anesthesia Quick Evaluation

## 2017-08-21 NOTE — Progress Notes (Addendum)
   Subjective:  Patient states he feels well. Was on 2L [place] canula this morning, but denies difficulty breathing or SOB.   Discussed his worsening renal function and ultrasound results. He denies difficulty with urination, bone pain, unintentional weight loss, or fatigue.    Objective:  Vital signs in last 24 hours: Vitals:   08/20/17 1530 08/20/17 1610 08/20/17 2204 08/21/17 0434  BP:  (!) 142/66 (!) 186/89 (!) 172/73  Pulse:  (!) 54 (!) 57 (!) 51  Resp:  18 18 18   Temp: 98 F (36.7 C) 98.1 F (36.7 C) 98.1 F (36.7 C) 97.7 F (36.5 C)  TempSrc: Oral Oral Oral Oral  SpO2:  98% 92% 99%  Weight:   185 lb 8 oz (84.1 kg)   Height:       General: Sleeping reclined in bed comfortably, NAD HEENT: [place]/AT, EOMI, no scleral icterus, no JVD Cardiac: Bradycardic, Systolic murmur best appreciated at apex Pulm: Normal effort, minimal crackles appreciated in left lower lung field Abd: soft, non tender, non distended, BS normal Ext: extremities well perfused, no peripheral edema Neuro: alert and oriented X3, cranial nerves II-XII grossly intact   Assessment/Plan:  Active Problems:   Essential hypertension   CKD (chronic kidney disease), stage III (HCC)   Acute respiratory failure with hypoxia (HCC)   Acute combined systolic and diastolic congestive heart failure (HCC)   Bradycardia, drug induced  Acute Combined Systolic and diastolic CHF Exacerbation  Acute respiratory failure with hypoxia Patient is asymptomatic with no shortness of breath, lung exam stable, no JVD. Saturating high 90s on RA.  -Continue Clonidine 0.1 mg TID  -Continue Bidil TID -Telemetry - sinus bradycardia -Follow-up appt in the [facility] after discharge on 08/25/17 at 11:30 AM.  Acute Renal failure on CKD Cr continuing to rise:  3.55>>4.20>>5.62>>6.44, BUN 68 GFR 8. Renal ultrasound with increased renal echogenicity, moderate left hydronephrosis (no stone), and a septate left inferior pole cyst. Phos 6.4,  Albumin 2.6,  Hgb 8.8. PTH elevated 162, Ca corrected 9.4 consistent with secondary hypothyroidism. Renal ultrasound without apparent cause of acute renal failure. Decreased UO depsite foley. Nephrology concerned about post renal etiology, recommend urology consult.  -Nephrology consulted, appreciate recs  -Urology consulted  Normocytic anemia Hgb 8.8, MCV 91.2. Iron panel consistent with iron deficiency anemia. Hemoglobin continues to trend down. -CBC in AM   HTN BP 172/73. Improved yesterday 140s/60s. -Clonidine 0.1 mg TID -Bidil TID - will consider dose titration tomorrow   Dispo: Anticipated discharge in approximately 1-2 days.   Melanee Spry, MD 08/21/2017, 7:19 AM Pager: [phone]

## 2017-08-21 NOTE — Transfer of Care (Signed)
Immediate Anesthesia Transfer of Care Note  Patient: [name] [name]  Procedure(s) Performed: CYSTOSCOPY WITH RETROGRADE PYELOGRAM/URETERAL JJ STENT PLACEMENT (Left )  Patient Location: PACU  Anesthesia Type:General  Level of Consciousness: sedated  Airway & Oxygen Therapy: Patient Spontanous Breathing and Patient connected to nasal cannula oxygen  Post-op Assessment: Report given to RN and Post -op Vital signs reviewed and stable  Post vital signs: Reviewed and stable  Last Vitals:  Vitals:   08/21/17 0959 08/21/17 1716  BP: (!) 169/74 134/60  Pulse: 93 (!) 46  Resp: 18 14  Temp: 36.9 C   SpO2: 98% 97%    Last Pain:  Vitals:   08/21/17 0959  TempSrc: Oral  PainSc:          Complications: No apparent anesthesia complications

## 2017-08-21 NOTE — Anesthesia Procedure Notes (Signed)
Procedure Name: LMA Insertion Date/Time: 08/21/2017 4:32 PM Performed by: Suzy Bouchard Pre-anesthesia Checklist: Patient identified, Emergency Drugs available, Suction available, Patient being monitored and Timeout performed Patient Re-evaluated:Patient Re-evaluated prior to induction Oxygen Delivery Method: Circle system utilized Preoxygenation: Pre-oxygenation with 100% oxygen Induction Type: IV induction Ventilation: Mask ventilation without difficulty LMA: LMA inserted LMA Size: 5.0 Tube type: Oral Number of attempts: 1 Placement Confirmation: positive ETCO2 and breath sounds checked- equal and bilateral Tube secured with: Tape Dental Injury: Teeth and Oropharynx as per pre-operative assessment

## 2017-08-22 ENCOUNTER — Inpatient Hospital Stay (HOSPITAL_COMMUNITY): Payer: Medicare Other

## 2017-08-22 DIAGNOSIS — Z9889 Other specified postprocedural states: Secondary | ICD-10-CM

## 2017-08-22 DIAGNOSIS — N139 Obstructive and reflux uropathy, unspecified: Secondary | ICD-10-CM

## 2017-08-22 DIAGNOSIS — I5041 Acute combined systolic (congestive) and diastolic (congestive) heart failure: Secondary | ICD-10-CM

## 2017-08-22 DIAGNOSIS — R001 Bradycardia, unspecified: Secondary | ICD-10-CM

## 2017-08-22 DIAGNOSIS — R011 Cardiac murmur, unspecified: Secondary | ICD-10-CM

## 2017-08-22 DIAGNOSIS — Z960 Presence of urogenital implants: Secondary | ICD-10-CM

## 2017-08-22 DIAGNOSIS — N179 Acute kidney failure, unspecified: Secondary | ICD-10-CM

## 2017-08-22 DIAGNOSIS — E875 Hyperkalemia: Secondary | ICD-10-CM

## 2017-08-22 DIAGNOSIS — Z79899 Other long term (current) drug therapy: Secondary | ICD-10-CM

## 2017-08-22 DIAGNOSIS — I130 Hypertensive heart and chronic kidney disease with heart failure and stage 1 through stage 4 chronic kidney disease, or unspecified chronic kidney disease: Principal | ICD-10-CM

## 2017-08-22 DIAGNOSIS — E8779 Other fluid overload: Secondary | ICD-10-CM

## 2017-08-22 DIAGNOSIS — N183 Chronic kidney disease, stage 3 (moderate): Secondary | ICD-10-CM

## 2017-08-22 DIAGNOSIS — J9601 Acute respiratory failure with hypoxia: Secondary | ICD-10-CM

## 2017-08-22 DIAGNOSIS — D649 Anemia, unspecified: Secondary | ICD-10-CM

## 2017-08-22 LAB — CBC
HEMATOCRIT: 27.1 % — AB (ref 39.0–52.0)
HEMOGLOBIN: 8.7 g/dL — AB (ref 13.0–17.0)
MCH: 29.5 pg (ref 26.0–34.0)
MCHC: 32.1 g/dL (ref 30.0–36.0)
MCV: 91.9 fL (ref 78.0–100.0)
Platelets: 187 10*3/uL (ref 150–400)
RBC: 2.95 MIL/uL — ABNORMAL LOW (ref 4.22–5.81)
RDW: 12.2 % (ref 11.5–15.5)
WBC: 5.5 10*3/uL (ref 4.0–10.5)

## 2017-08-22 LAB — RENAL FUNCTION PANEL
ALBUMIN: 2.4 g/dL — AB (ref 3.5–5.0)
ANION GAP: 14 (ref 5–15)
BUN: 81 mg/dL — ABNORMAL HIGH (ref 6–20)
CHLORIDE: 97 mmol/L — AB (ref 101–111)
CO2: 22 mmol/L (ref 22–32)
Calcium: 8.5 mg/dL — ABNORMAL LOW (ref 8.9–10.3)
Creatinine, Ser: 7.41 mg/dL — ABNORMAL HIGH (ref 0.61–1.24)
GFR calc Af Amer: 7 mL/min — ABNORMAL LOW (ref >60)
GFR calc non Af Amer: 6 mL/min — ABNORMAL LOW (ref >60)
GLUCOSE: 133 mg/dL — AB (ref 65–99)
POTASSIUM: 4.8 mmol/L (ref 3.5–5.1)
Phosphorus: 8.8 mg/dL — ABNORMAL HIGH (ref 2.5–4.6)
Sodium: 133 mmol/L — ABNORMAL LOW (ref 135–145)

## 2017-08-22 MED ORDER — IOPAMIDOL (ISOVUE-300) INJECTION 61%
15.0000 mL | INTRAVENOUS | Status: AC
  Administered 2017-08-22 (×2): 15 mL via ORAL

## 2017-08-22 MED ORDER — ISOSORB DINITRATE-HYDRALAZINE 20-37.5 MG PO TABS
2.0000 | ORAL_TABLET | Freq: Three times a day (TID) | ORAL | Status: DC
  Administered 2017-08-22 – 2017-08-25 (×10): 2 via ORAL
  Filled 2017-08-22 (×11): qty 2

## 2017-08-22 MED ORDER — SODIUM POLYSTYRENE SULFONATE 15 GM/60ML PO SUSP
15.0000 g | Freq: Once | ORAL | Status: AC
  Administered 2017-08-22: 15 g via ORAL
  Filled 2017-08-22: qty 60

## 2017-08-22 MED ORDER — ALBUTEROL SULFATE (2.5 MG/3ML) 0.083% IN NEBU: 10.0000 mg | INHALATION_SOLUTION | Freq: Once | RESPIRATORY_TRACT | Status: DC

## 2017-08-22 NOTE — Progress Notes (Signed)
Urology  CT reviewed.  Left JJ stent in good position.  ? Left ureteral stone.  No signs of upper tract obstruction involving the right kidney.  The patient can follow up as an OP for possible ureteroscopy.  Urology will sign off at this time.    Ellison Hughs, MD

## 2017-08-22 NOTE — Progress Notes (Signed)
Internal Medicine Attending:   I saw and examined the patient. I reviewed the resident's note and I agree with the resident's findings and plan as documented in the resident's note.  83 year old man here with volume overload due to acute renal failure which is due to obstructive uropathy.  He did well with right ureteral stent placement yesterday and is having more urine output from his Foley catheter.  Creatinine continues to rise, starting to become mildly hyperkalemic, uremic, and appears moderately volume overloaded today.  Point-of-care ultrasound both of his kidneys do not appear to have hydronephrosis.  We talked with Dr. Jimmy Footman and decided to do a CT abd/pelvis without contrast to ensure all the obstruction is resolved. I advise the patient that if the renal function does not improve over the next 1-2 days we would likely need to use a short course of supportive dialysis.  He understands the situation that he is in, and we are all hopeful that his labs and volume status will improve over the next 24 hours.

## 2017-08-22 NOTE — Progress Notes (Signed)
Contact patient son with any updates or concerns please. Is his listed contact [phone]

## 2017-08-22 NOTE — Progress Notes (Signed)
S:83 year old gentleman with history of prostate cancer, hypertension, CKD, CHF and rapidly worsening renal function.  Patient was feeling better, had no complaints, denies increased somnolence, nausea vomiting. Having good appetite. No chest pain or shortness of breath. He had cystoscopy with left stent placement yesterday, tolerated the procedure very well. There was some improvement in urinary output after the procedure.  Renal function continued to get worse.  O:BP (!) 150/63   Pulse (!) 46   Temp 97.8 F (36.6 C) (Oral)   Resp 16   Ht 6\' 2"  (1.88 m)   Wt 188 lb 11.4 oz (85.6 kg)   SpO2 97%   BMI 24.23 kg/m   Intake/Output Summary (Last 24 hours) at 08/22/17 1059 Last data filed at 08/22/17 0600  Gross per 24 hour  Intake              365 ml  Output              380 ml  Net              -15 ml   Intake/Output: I/O last 3 completed shifts: In: 77 [P.O.:720; I.V.:128] Out: 980 [Urine:975; Blood:5]  Intake/Output this shift:  No intake/output data recorded. Weight change: 3 lb 3.2 oz (1.452 kg)   Gen: well-developed elderly man, in no acute distress. CVS: regular rate and rhythm.  Gr2/5 SEM Resp: Bilaterally decreased basal air entry with few basal crackles. Abd: soft, non tender, bowel sounds positive. Ext: trace pedal edema bilaterally.   Recent Labs Lab 08/16/17 0004 08/16/17 0703 08/17/17 5397 08/18/17 0314 08/19/17 0247 08/20/17 0327 08/20/17 1425 08/21/17 0516 08/21/17 0947 08/22/17 0308  NA 135 137 138 137 137 135  --  133*  --  133*  K 4.3 4.2 3.9 4.0 3.8 4.2  --  4.1  --  4.8  CL 105 102 101 99* 100* 97*  --  97*  --  97*  CO2 22 25 27 28 27 26   --  23  --  22  GLUCOSE 137* 95 92 90 100* 101*  --  106*  --  133*  BUN 27* 25* 29* 39* 45* 59*  --  68*  --  81*  CREATININE 2.92* 2.79* 3.15* 3.55* 4.20* 5.62*  --  6.44*  --  7.41*  ALBUMIN 3.6  --   --   --   --   --   --  2.6* 2.6* 2.4*  CALCIUM 8.7* 8.4* 8.8* 8.5* 8.4* 8.4* 8.2* 8.3*  --  8.5*   PHOS  --   --   --   --   --   --  6.1* 6.4*  --  8.8*  AST 17  --   --   --   --   --   --   --  18  --   ALT 11*  --   --   --   --   --   --   --  16*  --    Liver Function Tests:  Recent Labs Lab 08/16/17 0004 08/21/17 0516 08/21/17 0947 08/22/17 0308  AST 17  --  18  --   ALT 11*  --  16*  --   ALKPHOS 70  --  51  --   BILITOT 0.7  --  0.4  --   PROT 8.0  --  6.3*  --   ALBUMIN 3.6 2.6* 2.6* 2.4*   No results for input(s): LIPASE, AMYLASE in the  last 168 hours. No results for input(s): AMMONIA in the last 168 hours. CBC:  Recent Labs Lab 08/16/17 0004 08/17/17 0817 08/21/17 0516 08/22/17 0308  WBC 8.7 4.9 6.3 5.5  NEUTROABS 7.2  --   --   --   HGB 11.6* 10.4* 8.8* 8.7*  HCT 35.5* 32.6* 26.9* 27.1*  MCV 91.3 92.4 91.2 91.9  PLT 202 166 167 187   Cardiac Enzymes: No results for input(s): CKTOTAL, CKMB, CKMBINDEX, TROPONINI in the last 168 hours. CBG: No results for input(s): GLUCAP in the last 168 hours.  Iron Studies:  Recent Labs  08/20/17 1425  IRON 22*  TIBC 190*  FERRITIN 235   Studies/Results: Dg Abd 1 View  Result Date: 08/21/2017 CLINICAL DATA:  Cystoscopy with retrograde pyelogram. EXAM: ABDOMEN - 1 VIEW; DG C-ARM 61-120 MIN COMPARISON:  Renal ultrasound earlier today. FINDINGS: Laterality is not marked, left is assumed based on prior imaging. Images show ureteral catheterization and stent placement. Small volume contrast which diluted within the hydronephrotic left intrarenal collecting system. The offending obstruction is difficult to visualize on these static images. IMPRESSION: Fluoroscopy for ureteral stenting of the hydronephrotic left kidney. Electronically Signed   By: Monte Fantasia M.D.   On: 08/21/2017 18:36   US Renal  Result Date: 08/21/2017 CLINICAL DATA:  83 year old male with acute renal insufficiency. History of hypertension, CHF. EXAM: RENAL / URINARY TRACT ULTRASOUND COMPLETE COMPARISON:  Abdominal CT dated 11/29/2009 and MRI  dated 12/15/2010 FINDINGS: Right Kidney: Length: 11.3 cm. There is increased renal echogenicity. There is no hydronephrosis or echogenic stone. An 8 x 6 x 8 mm cyst in the anterior interpolar kidney. Left Kidney: Length: 12.3 cm. Diffuse increased echogenicity. There is a moderate right hydronephrosis. There is a 4 x 4 x 3 cm upper pole cyst. A 3.6 x 4.1 x 4.6 cm complex cyst with internal septation noted in the inferior pole. No flow identified within the internal septation. Follow-up with ultrasound in 3 months or further characterization with MRI recommended. Bladder: The urinary bladder is decompressed around a Foley catheter. IMPRESSION: 1. Increased renal echogenicity in keeping with medical renal disease. Clinical correlation is recommended. 2. Moderate left hydronephrosis.  No stone identified. 3. Septated left renal inferior pole cyst. Follow-up with ultrasound in 3 months recommended. Electronically Signed   By: Anner Crete M.D.   On: 08/21/2017 01:19   Dg C-arm 1-60 Min  Result Date: 08/21/2017 CLINICAL DATA:  Cystoscopy with retrograde pyelogram. EXAM: ABDOMEN - 1 VIEW; DG C-ARM 61-120 MIN COMPARISON:  Renal ultrasound earlier today. FINDINGS: Laterality is not marked, left is assumed based on prior imaging. Images show ureteral catheterization and stent placement. Small volume contrast which diluted within the hydronephrotic left intrarenal collecting system. The offending obstruction is difficult to visualize on these static images. IMPRESSION: Fluoroscopy for ureteral stenting of the hydronephrotic left kidney. Electronically Signed   By: Monte Fantasia M.D.   On: 08/21/2017 18:36   . aspirin EC  81 mg Oral Daily  . brimonidine  1 drop Both Eyes BID  . calcitRIOL  0.25 mcg Oral Daily  . cloNIDine  0.1 mg Oral TID  . heparin  5,000 Units Subcutaneous Q8H  . isosorbide-hydrALAZINE  1 tablet Oral TID  . latanoprost  1 drop Both Eyes QHS  . sodium chloride flush  3 mL Intravenous Q12H   . timolol  1 drop Both Eyes Daily    BMET    Component Value Date/Time   NA 133 (L) 08/22/2017  0308   NA 140 02/23/2017 1531   K 4.8 08/22/2017 0308   CL 97 (L) 08/22/2017 0308   CO2 22 08/22/2017 0308   GLUCOSE 133 (H) 08/22/2017 0308   BUN 81 (H) 08/22/2017 0308   BUN 26 02/23/2017 1531   CREATININE 7.41 (H) 08/22/2017 0308   CREATININE 1.54 (H) 07/03/2014 1409   CALCIUM 8.5 (L) 08/22/2017 0308   CALCIUM 8.2 (L) 08/20/2017 1425   GFRNONAA 6 (L) 08/22/2017 0308   GFRNONAA 41 (L) 09/05/2013 1530   GFRAA 7 (L) 08/22/2017 0308   GFRAA 47 (L) 09/05/2013 1530   CBC    Component Value Date/Time   WBC 5.5 08/22/2017 0308   RBC 2.95 (L) 08/22/2017 0308   HGB 8.7 (L) 08/22/2017 0308   HGB 12.7 (L) 02/23/2017 1531   HCT 27.1 (L) 08/22/2017 0308   HCT 37.5 02/23/2017 1531   PLT 187 08/22/2017 0308   PLT 268 02/23/2017 1531   MCV 91.9 08/22/2017 0308   MCV 91 02/23/2017 1531   MCH 29.5 08/22/2017 0308   MCHC 32.1 08/22/2017 0308   RDW 12.2 08/22/2017 0308   RDW 12.7 02/23/2017 1531   LYMPHSABS 0.6 (L) 08/16/2017 0004   LYMPHSABS 1.4 02/23/2017 1531   MONOABS 0.8 08/16/2017 0004   EOSABS 0.1 08/16/2017 0004   EOSABS 0.1 02/23/2017 1531   BASOSABS 0.0 08/16/2017 0004   BASOSABS 0.1 02/23/2017 1531     Assessment/Plan:  AKI with CKD. His kidney function continues to get worsen, potassium trending up bicarbonate Down, mild hyponatremia. No clinical sign of uremia. -Appears a little more hypervolemic.  -Initially thought to be obstructive uropathy, had cystoscopy with left stent placement yesterday, he had a left mid uretral obstructing stone. -CT abdomen and pelvis without contrast to rule out any obstruction. Concern of R ureter -Keep monitoring renal function. -Might need temporary dialysis and renal function continue to get worse. -We will hold Lasix for now.   HFrEF.Patient remained asymptomatic with no hypoxia or shortness of breath. No hypoxia. Remained little  bradycardic. -No beta blocker.  Hypertension. Blood pressure remained elevated, but improved as compared to yesterday. -Continue current management.  Anemia. Hemoglobin stable, had 1 dose of Feraheme yesterday. FOBT still pending. -Continue to monitor.  Metabolic bone disease. Parathyroid  was elevated at 162, due to CK D. - calcitriol 0.25 mcg daily.   Prostate Ca. S/p radiation in 1999 PSA is stable.  I have seen and examined this patient and agree with the plan of care seen, examined, eval, discussed with resident , primary svc .  Terrilynn Postell L 08/22/2017, 3:35 PM

## 2017-08-22 NOTE — Progress Notes (Signed)
1 Day Post-Op Subjective: No acute events overnight.  Denies flank or suprapubic discomfort.  Objective: Vital signs in last 24 hours: Temp:  [97.7 F (36.5 C)-98.1 F (36.7 C)] 97.8 F (36.6 C) (10/28 0759) Pulse Rate:  [44-48] 46 (10/28 0759) Resp:  [14-18] 16 (10/28 0356) BP: (134-159)/(60-69) 150/63 (10/28 0949) SpO2:  [94 %-98 %] 97 % (10/28 0759) Weight:  [85.6 kg (188 lb 11.2 oz)-85.6 kg (188 lb 11.4 oz)] 85.6 kg (188 lb 11.4 oz) (10/28 0102)  Intake/Output from previous day: 10/27 0701 - 10/28 0700 In: 605 [P.O.:480; I.V.:125] Out: 580 [Urine:575; Blood:5]  Intake/Output this shift: No intake/output data recorded.  Physical Exam:  General: Alert and oriented CV: RRR, palpable distal pulses Lungs: CTAB, equal chest rise Abdomen: Soft, NTND, no rebound or guarding GU: Foley in place and draining yellow urine Ext: NT, No erythema  Lab Results:  Recent Labs  08/21/17 0516 08/22/17 0308  HGB 8.8* 8.7*  HCT 26.9* 27.1*   BMET  Recent Labs  08/21/17 0516 08/22/17 0308  NA 133* 133*  K 4.1 4.8  CL 97* 97*  CO2 23 22  GLUCOSE 106* 133*  BUN 68* 81*  CREATININE 6.44* 7.41*  CALCIUM 8.3* 8.5*     Studies/Results: Dg Abd 1 View  Result Date: 08/21/2017 CLINICAL DATA:  Cystoscopy with retrograde pyelogram. EXAM: ABDOMEN - 1 VIEW; DG C-ARM 61-120 MIN COMPARISON:  Renal ultrasound earlier today. FINDINGS: Laterality is not marked, left is assumed based on prior imaging. Images show ureteral catheterization and stent placement. Small volume contrast which diluted within the hydronephrotic left intrarenal collecting system. The offending obstruction is difficult to visualize on these static images. IMPRESSION: Fluoroscopy for ureteral stenting of the hydronephrotic left kidney. Electronically Signed   By: Monte Fantasia M.D.   On: 08/21/2017 18:36   US Renal  Result Date: 08/21/2017 CLINICAL DATA:  83 year old male with acute renal insufficiency. History of  hypertension, CHF. EXAM: RENAL / URINARY TRACT ULTRASOUND COMPLETE COMPARISON:  Abdominal CT dated 11/29/2009 and MRI dated 12/15/2010 FINDINGS: Right Kidney: Length: 11.3 cm. There is increased renal echogenicity. There is no hydronephrosis or echogenic stone. An 8 x 6 x 8 mm cyst in the anterior interpolar kidney. Left Kidney: Length: 12.3 cm. Diffuse increased echogenicity. There is a moderate right hydronephrosis. There is a 4 x 4 x 3 cm upper pole cyst. A 3.6 x 4.1 x 4.6 cm complex cyst with internal septation noted in the inferior pole. No flow identified within the internal septation. Follow-up with ultrasound in 3 months or further characterization with MRI recommended. Bladder: The urinary bladder is decompressed around a Foley catheter. IMPRESSION: 1. Increased renal echogenicity in keeping with medical renal disease. Clinical correlation is recommended. 2. Moderate left hydronephrosis.  No stone identified. 3. Septated left renal inferior pole cyst. Follow-up with ultrasound in 3 months recommended. Electronically Signed   By: Anner Crete M.D.   On: 08/21/2017 01:19   Dg C-arm 1-60 Min  Result Date: 08/21/2017 CLINICAL DATA:  Cystoscopy with retrograde pyelogram. EXAM: ABDOMEN - 1 VIEW; DG C-ARM 61-120 MIN COMPARISON:  Renal ultrasound earlier today. FINDINGS: Laterality is not marked, left is assumed based on prior imaging. Images show ureteral catheterization and stent placement. Small volume contrast which diluted within the hydronephrotic left intrarenal collecting system. The offending obstruction is difficult to visualize on these static images. IMPRESSION: Fluoroscopy for ureteral stenting of the hydronephrotic left kidney. Electronically Signed   By: Monte Fantasia M.D.   On:  08/21/2017 18:36    Assessment/Plan:  POD 1 s/p cystoscopy with left JJ stent placement secondary to left hydronephrosis from an obstructing left mid-ureteral stone 1.  Left hydronephrosis- s/p left JJ  stent 2.  Acute on chronic renal failure- worsening GFR and creatinine despite bladder decompression and left JJ stent placement.  CT abd/pel wo contrast pending.  May require HD.  Will follow-up results of that scan.  He will eventually need to have his stone addressed in the outpatient setting, follow up in depart.  Ok to remove Foley from my standpoint. 3.  History of prostate cancer s/p radiation in 1999, stable PSA   LOS: 6 days   Ellison Hughs, MD 08/22/2017, 11:18 AM  [facility] Pager: [phone]

## 2017-08-22 NOTE — Progress Notes (Signed)
   Subjective:  Patient tolerated cytoscopy and stent placement well. States he feels well this morning and has no acute complaints. He denies shortness of breath or orthopnea.   Discussed worsening renal function and the potential need for temporary dialysis if renal function fails to improve.   Objective:  Vital signs in last 24 hours: Vitals:   08/22/17 0102 08/22/17 0356 08/22/17 0759 08/22/17 0949  BP:  (!) 159/69 (!) 150/63 (!) 150/63  Pulse:  (!) 48 (!) 46   Resp:  16    Temp:  97.7 F (36.5 C) 97.8 F (36.6 C)   TempSrc:  Oral Oral   SpO2:  96% 97%   Weight: 188 lb 11.4 oz (85.6 kg)     Height:       General: Sleeping upright in bed, appears comfortable, NAD HEENT: [place]/AT, EOMI, no scleral icterus, no JVD Cardiac: Bradycardic, Systolic murmur best appreciated at apex Pulm: Normal effort, minimal crackles appreciated in right lower lung field Abd: soft, non tender, non distended, BS normal Ext: extremities well perfused, no peripheral edema Neuro: alert and oriented X3, cranial nerves II-XII grossly intact   Assessment/Plan:  Active Problems:   Essential hypertension   CKD (chronic kidney disease), stage III (HCC)   Acute respiratory failure with hypoxia (HCC)   Acute combined systolic and diastolic congestive heart failure (HCC)   Bradycardia, drug induced   Acute Renal failure on CKD Cr continuing to rise:  3.55>>4.20>>5.62>>6.44>>7.41, BUN 81, K+ 4.8, Bicarb 22. S/p cytoscopy with left ureteral stent placement; UO improving. Still concern for right sided obstruction, will get CT abdo pelvis.  -Nephrology consulted, appreciate recs  -CT Abdomen Pelvis - evaluate for right kidney obstruction -Renal function panel in AM- closely monitoring  -12 lead EKG  Acute Combined Systolic and diastolic CHF Exacerbation  Acute respiratory failure with hypoxia Patient is asymptomatic without shortness of breath, lung exam stable, no JVD appreciated. Saturating high 90s on  RA.  -Strict I/Os -Continue Clonidine 0.1 mg TID  -Continue Bidil TID -12 lead EKG -Telemetry - sinus bradycardia -Follow-up appt in the [facility] after discharge on 08/25/17 at 11:30 AM.  Normocytic anemia Hgb 8.7, MCV 91.9. Iron panel consistent with iron deficiency anemia.  -FOBT  -CBC in AM   HTN BP stable and improved - 150/63.  -Clonidine 0.1 mg TID -Bidil TID  Dispo: Anticipated discharge in approximately 1-2 days.   Melanee Spry, MD 08/22/2017, 9:54 AM Pager: [phone]

## 2017-08-22 NOTE — Progress Notes (Signed)
12 lead EKG completed- shows Sinus Bradycardia with 1st AV Block with premature atrial complexes with aberrant conduction. Left bundle branch block.

## 2017-08-23 ENCOUNTER — Encounter (HOSPITAL_COMMUNITY): Payer: Self-pay | Admitting: Urology

## 2017-08-23 DIAGNOSIS — D509 Iron deficiency anemia, unspecified: Secondary | ICD-10-CM

## 2017-08-23 DIAGNOSIS — N178 Other acute kidney failure: Secondary | ICD-10-CM

## 2017-08-23 DIAGNOSIS — N1330 Unspecified hydronephrosis: Secondary | ICD-10-CM

## 2017-08-23 LAB — RENAL FUNCTION PANEL
Albumin: 2.3 g/dL — ABNORMAL LOW (ref 3.5–5.0)
Anion gap: 14 (ref 5–15)
BUN: 92 mg/dL — AB (ref 6–20)
CHLORIDE: 94 mmol/L — AB (ref 101–111)
CO2: 23 mmol/L (ref 22–32)
Calcium: 8 mg/dL — ABNORMAL LOW (ref 8.9–10.3)
Creatinine, Ser: 7.65 mg/dL — ABNORMAL HIGH (ref 0.61–1.24)
GFR calc Af Amer: 7 mL/min — ABNORMAL LOW (ref >60)
GFR, EST NON AFRICAN AMERICAN: 6 mL/min — AB (ref >60)
GLUCOSE: 99 mg/dL (ref 65–99)
POTASSIUM: 3.7 mmol/L (ref 3.5–5.1)
Phosphorus: 8.5 mg/dL — ABNORMAL HIGH (ref 2.5–4.6)
Sodium: 131 mmol/L — ABNORMAL LOW (ref 135–145)

## 2017-08-23 LAB — CBC
HEMATOCRIT: 25.1 % — AB (ref 39.0–52.0)
Hemoglobin: 8.3 g/dL — ABNORMAL LOW (ref 13.0–17.0)
MCH: 30.2 pg (ref 26.0–34.0)
MCHC: 33.1 g/dL (ref 30.0–36.0)
MCV: 91.3 fL (ref 78.0–100.0)
Platelets: 182 10*3/uL (ref 150–400)
RBC: 2.75 MIL/uL — ABNORMAL LOW (ref 4.22–5.81)
RDW: 12.5 % (ref 11.5–15.5)
WBC: 7.4 10*3/uL (ref 4.0–10.5)

## 2017-08-23 NOTE — Progress Notes (Signed)
S:83 year old gentleman with history of prostate cancer, hypertension, CKD, CHF and rapidly worsening renal function.  Patient has no complaints today, denies any nausea vomiting,itching, increased somnolence. Appetite remained good. No chest pain or shortness of breath. He is saturating well.  Creatinine continued to get worse with GFR of 7 today. Urine output little improved.  O:BP (!) 168/66   Pulse 67   Temp (!) 97.5 F (36.4 C) (Oral)   Resp 18   Ht 6\' 2"  (1.88 m)   Wt 188 lb 11.4 oz (85.6 kg)   SpO2 97%   BMI 24.23 kg/m   Intake/Output Summary (Last 24 hours) at 08/23/17 1328 Last data filed at 08/23/17 0958  Gross per 24 hour  Intake              360 ml  Output              950 ml  Net             -590 ml   Intake/Output: I/O last 3 completed shifts: In: 44 [P.O.:960] Out: 625 [Urine:625]  Intake/Output this shift:  Total I/O In: 120 [P.O.:120] Out: 500 [Urine:500] Weight change: 5 lb (2.268 kg)   Gen: well-developed elderly man, in no acute distress. CVS: regular rate and rhythm.   Resp: Bilaterally decreased basal air entry with few basal crackles. Abd: soft, non tender, bowel sounds positive. Ext: trace pedal edema bilaterally.   Recent Labs Lab 08/17/17 0817 08/18/17 0314 08/19/17 0247 08/20/17 0327 08/20/17 1425 08/21/17 0516 08/21/17 0947 08/22/17 0308 08/23/17 0339  NA 138 137 137 135  --  133*  --  133* 131*  K 3.9 4.0 3.8 4.2  --  4.1  --  4.8 3.7  CL 101 99* 100* 97*  --  97*  --  97* 94*  CO2 27 28 27 26   --  23  --  22 23  GLUCOSE 92 90 100* 101*  --  106*  --  133* 99  BUN 29* 39* 45* 59*  --  68*  --  81* 92*  CREATININE 3.15* 3.55* 4.20* 5.62*  --  6.44*  --  7.41* 7.65*  ALBUMIN  --   --   --   --   --  2.6* 2.6* 2.4* 2.3*  CALCIUM 8.8* 8.5* 8.4* 8.4* 8.2* 8.3*  --  8.5* 8.0*  PHOS  --   --   --   --  6.1* 6.4*  --  8.8* 8.5*  AST  --   --   --   --   --   --  18  --   --   ALT  --   --   --   --   --   --  16*  --   --     Liver Function Tests:  Recent Labs Lab 08/21/17 0947 08/22/17 0308 08/23/17 0339  AST 18  --   --   ALT 16*  --   --   ALKPHOS 51  --   --   BILITOT 0.4  --   --   PROT 6.3*  --   --   ALBUMIN 2.6* 2.4* 2.3*   No results for input(s): LIPASE, AMYLASE in the last 168 hours. No results for input(s): AMMONIA in the last 168 hours. CBC:  Recent Labs Lab 08/17/17 0817 08/21/17 0516 08/22/17 0308 08/23/17 0339  WBC 4.9 6.3 5.5 7.4  HGB 10.4* 8.8* 8.7* 8.3*  HCT 32.6* 26.9*  27.1* 25.1*  MCV 92.4 91.2 91.9 91.3  PLT 166 167 187 182   Cardiac Enzymes: No results for input(s): CKTOTAL, CKMB, CKMBINDEX, TROPONINI in the last 168 hours. CBG: No results for input(s): GLUCAP in the last 168 hours.  Iron Studies:  Recent Labs  08/20/17 1425  IRON 22*  TIBC 190*  FERRITIN 235   Studies/Results: Ct Abdomen Pelvis Wo Contrast  Result Date: 08/22/2017 CLINICAL DATA:  Renal failure EXAM: CT ABDOMEN AND PELVIS WITHOUT CONTRAST TECHNIQUE: Multidetector CT imaging of the abdomen and pelvis was performed following the standard protocol without IV contrast. COMPARISON:  Ultrasound 04/21/2017, MRI 12/15/2010, CT 11/29/2009 FINDINGS: Lower chest: Lung bases demonstrate small scattered foci of ground-glass density in the right middle lobe and right lung base. Partial consolidation in the left lung base. Small bilateral pleural effusions. Stable 3.5 x 2 cm pleural-based density at the right medial base. Mild cardiomegaly. Hepatobiliary: Contracted gallbladder. No calcified stones. No focal hepatic abnormality or biliary dilatation Pancreas: Unremarkable. No pancreatic ductal dilatation or surrounding inflammatory changes. Spleen: Normal in size without focal abnormality. Adrenals/Urinary Tract: Adrenal glands are within normal limits. Right kidney shows no hydronephrosis or stone. A left ureteral stent is in place. There is slight dilatation of left extrarenal pelvis. There are cysts within the  left kidney. No definitive stones are visualized. Distal portion of the stent is in the posterior bladder. A Foley catheter and air are present in the bladder. Stomach/Bowel: Stomach is within normal limits. Appendix appears normal. No evidence of bowel wall thickening, distention, or inflammatory changes. Vascular/Lymphatic: Moderate atherosclerotic calcifications. Suprarenal abdominal aorta measures 2.7 cm. No significantly enlarged lymph nodes Reproductive: Slightly enlarged prostate. Other: Negative for free air or free fluid. Nonspecific perirectal soft tissue stranding. Musculoskeletal: Degenerative changes. No acute or suspicious bone lesion. IMPRESSION: 1. Left ureteral stent is in place. Mild dilatation of left renal pelvis but no frank hydronephrosis. No definitive stones are seen. 2. Negative for right hydronephrosis, intrarenal calculus or ureteral stone 3. Stable 3.5 cm pleural based density on the right, benign given lack of interval change. Small pleural effusions and small foci of infection or inflammation at the right middle lobe and right base. 4. 2.7 cm diameter proximal abdominal aorta. Ectatic abdominal aorta at risk for aneurysm development. Recommend followup by ultrasound in 5 years. This recommendation follows ACR consensus guidelines: White Paper of the ACR Incidental Findings Committee II on Vascular Findings. J Am Coll Radiol 2013; 10:789-794. Electronically Signed   By: Donavan Foil M.D.   On: 08/22/2017 21:32   Dg Abd 1 View  Result Date: 08/21/2017 CLINICAL DATA:  Cystoscopy with retrograde pyelogram. EXAM: ABDOMEN - 1 VIEW; DG C-ARM 61-120 MIN COMPARISON:  Renal ultrasound earlier today. FINDINGS: Laterality is not marked, left is assumed based on prior imaging. Images show ureteral catheterization and stent placement. Small volume contrast which diluted within the hydronephrotic left intrarenal collecting system. The offending obstruction is difficult to visualize on these  static images. IMPRESSION: Fluoroscopy for ureteral stenting of the hydronephrotic left kidney. Electronically Signed   By: Monte Fantasia M.D.   On: 08/21/2017 18:36   Dg C-arm 1-60 Min  Result Date: 08/21/2017 CLINICAL DATA:  Cystoscopy with retrograde pyelogram. EXAM: ABDOMEN - 1 VIEW; DG C-ARM 61-120 MIN COMPARISON:  Renal ultrasound earlier today. FINDINGS: Laterality is not marked, left is assumed based on prior imaging. Images show ureteral catheterization and stent placement. Small volume contrast which diluted within the hydronephrotic left intrarenal collecting system. The offending  obstruction is difficult to visualize on these static images. IMPRESSION: Fluoroscopy for ureteral stenting of the hydronephrotic left kidney. Electronically Signed   By: Monte Fantasia M.D.   On: 08/21/2017 18:36   . albuterol  10 mg Nebulization Once  . aspirin EC  81 mg Oral Daily  . brimonidine  1 drop Both Eyes BID  . calcitRIOL  0.25 mcg Oral Daily  . heparin  5,000 Units Subcutaneous Q8H  . isosorbide-hydrALAZINE  2 tablet Oral TID  . latanoprost  1 drop Both Eyes QHS  . sodium chloride flush  3 mL Intravenous Q12H  . timolol  1 drop Both Eyes Daily    BMET    Component Value Date/Time   NA 131 (L) 08/23/2017 0339   NA 140 02/23/2017 1531   K 3.7 08/23/2017 0339   CL 94 (L) 08/23/2017 0339   CO2 23 08/23/2017 0339   GLUCOSE 99 08/23/2017 0339   BUN 92 (H) 08/23/2017 0339   BUN 26 02/23/2017 1531   CREATININE 7.65 (H) 08/23/2017 0339   CREATININE 1.54 (H) 07/03/2014 1409   CALCIUM 8.0 (L) 08/23/2017 0339   CALCIUM 8.2 (L) 08/20/2017 1425   GFRNONAA 6 (L) 08/23/2017 0339   GFRNONAA 41 (L) 09/05/2013 1530   GFRAA 7 (L) 08/23/2017 0339   GFRAA 47 (L) 09/05/2013 1530   CBC    Component Value Date/Time   WBC 7.4 08/23/2017 0339   RBC 2.75 (L) 08/23/2017 0339   HGB 8.3 (L) 08/23/2017 0339   HGB 12.7 (L) 02/23/2017 1531   HCT 25.1 (L) 08/23/2017 0339   HCT 37.5 02/23/2017 1531    PLT 182 08/23/2017 0339   PLT 268 02/23/2017 1531   MCV 91.3 08/23/2017 0339   MCV 91 02/23/2017 1531   MCH 30.2 08/23/2017 0339   MCHC 33.1 08/23/2017 0339   RDW 12.5 08/23/2017 0339   RDW 12.7 02/23/2017 1531   LYMPHSABS 0.6 (L) 08/16/2017 0004   LYMPHSABS 1.4 02/23/2017 1531   MONOABS 0.8 08/16/2017 0004   EOSABS 0.1 08/16/2017 0004   EOSABS 0.1 02/23/2017 1531   BASOSABS 0.0 08/16/2017 0004   BASOSABS 0.1 02/23/2017 1531     Assessment/Plan:  AKI with CKD. Renal function continued to get worse, mild hyponatremia. No sign of uremia. Initially thought to be an obstructive uropathy, CT abdomen was negative for any source of obstruction. Urinary output started improving- auto diuresis ?if it is thought to diaphoresis we anticipate some improvement in his renal function. -continue to monitor urinary output and renal function.  HFrEF.Patient remained asymptomatic with no hypoxia or shortness of breath. No hypoxia. -continue to monitor.  Hypertension. Blood pressure Improving. -Continue current management.  Anemia. Hemoglobin stable, had 1 dose of Feraheme . FOBT still pending. -Continue to monitor.  Metabolic bone disease. Parathyroid  was elevated at 162, due to CK D. - calcitriol 0.25 mcg daily.   Prostate Ca. S/p radiation in 1999 PSA is stable.   Lorella Nimrod

## 2017-08-23 NOTE — Progress Notes (Signed)
   Subjective:  Patient is accompanied by his son this morning. Patient states he feels well and has no acute complaints. He denies shortness of breath or orthopnea. He denies abdominal pain. Denies confusion.   Objective:  Vital signs in last 24 hours: Vitals:   08/22/17 1406 08/22/17 1608 08/23/17 0411 08/23/17 0433  BP: (!) 138/50 (!) 168/70  (!) 144/61  Pulse: (!) 47 (!) 51  94  Resp:    19  Temp:    98.7 F (37.1 C)  TempSrc:    Oral  SpO2: 95%   97%  Weight:   188 lb 11.4 oz (85.6 kg)   Height:       General: Sitting upright in bed, appears comfortable, NAD HEENT: [place]/AT, EOMI, no scleral icterus, minimal JVD Cardiac: Bradycardic, Systolic murmur best appreciated at apex Pulm: Normal effort, mild bibasilar crackles appreciated stable from previous exams Abd: soft, non tender, non distended, BS normal Ext: extremities well perfused, no peripheral edema Neuro: alert and oriented X3, cranial nerves II-XII grossly intact   Assessment/Plan:  Active Problems:   Essential hypertension   CKD (chronic kidney disease), stage III (HCC)   Acute respiratory failure with hypoxia (HCC)   Acute combined systolic and diastolic congestive heart failure (HCC)   Bradycardia, drug induced   Acute Renal failure on CKD Cr 7.65, BUN 92, K+ 3.7, Bicarb 23. CT abdo/pelvis with no evidence of right kidney obstruction or stone. EKG yesterday showed sinus bradycardia, HR 48. In setting of acute renal failure and upper limit of normal K+, the patient was given kayexalate. Potassium improved today, 3.1. Also stopped clonidine as it may have been contributing to his bradycardia. Patient does not appear uremic. Volume status is stable.  -Nephrology consulted, appreciate recs  -Renal function panel in AM- closely monitoring  -D/c foley today  Acute Combined Systolic and diastolic CHF Exacerbation  Patient is asymptomatic without shortness of breath, lung exam stable, mild JVD appreciated. Saturating  high 90s on RA. Weight stable at 188. Volume status is stable.  -Strict I/Os -Continue Bidil 2 tablets TID for BP management -Telemetry - sinus bradycardia with intermitted 1-2 second pauses -Follow-up appt in [facility] after discharge on 08/25/17 at 11:30 AM.  Normocytic anemia Hgb 8.3, MCV 91.3. Iron panel consistent with iron deficiency anemia. Received 1 dose of feraheme.  -FOBT pending -continue to monitor with daily CBCs   HTN BP stable and improved - 144/61.  -Bidil TID  Hypocalcemia -0.25 mcg calcitriol daily   Dispo: Anticipated discharge in approximately 2-3 days.   Melanee Spry, MD 08/23/2017, 8:02 AM Pager: [phone]

## 2017-08-23 NOTE — Discharge Summary (Signed)
Name: [name] [name] MRN: [id number] DOB: November 24, 1929 83 y.o. PCP: Tawny Asal, MD  Date of Admission: 08/16/2017 12:19 AM Date of Discharge: 08/25/2017 Attending Physician: Lucious Groves, DO  Discharge Diagnosis: 1. Acute Combined Systolic and Diastolic Congestive Heart Failure 2. Acute on chronic renal failure Principal Problem:   Acute combined systolic and diastolic congestive heart failure (HCC) Active Problems:   Essential hypertension   CKD (chronic kidney disease), stage III (HCC)   Acute respiratory failure with hypoxia (HCC)   Bradycardia, drug induced   Ectatic abdominal aorta (HCC)   Abdominal aortic atherosclerosis (HCC)   Hydronephrosis of left kidney   Acute renal failure (HCC)   Secondary hyperparathyroidism of renal origin [facility])   Discharge Medications: Allergies as of 08/25/2017   No Known Allergies     Medication List    STOP taking these medications   lisinopril 40 MG tablet Commonly known as:  PRINIVIL,ZESTRIL   NIFEdipine 90 MG 24 hr tablet Commonly known as:  PROCARDIA XL/ADALAT-CC     TAKE these medications   amLODipine 10 MG tablet Commonly known as:  NORVASC Take 1 tablet (10 mg total) by mouth daily.   aspirin 81 MG EC tablet Take 1 tablet (81 mg total) by mouth daily.   brimonidine 0.2 % ophthalmic solution Commonly known as:  ALPHAGAN Place 1 drop into both eyes 2 (two) times daily.   calcitRIOL 0.25 MCG capsule Commonly known as:  ROCALTROL Take 1 capsule (0.25 mcg total) by mouth daily.   Cholecalciferol 50000 units Tabs Take 50,000 Units by mouth once a week.   diclofenac sodium 1 % Gel Commonly known as:  VOLTAREN Apply 2 g topically 4 (four) times daily.   isosorbide-hydrALAZINE 20-37.5 MG tablet Commonly known as:  BIDIL Take 2 tablets by mouth 3 (three) times daily.   latanoprost 0.005 % ophthalmic solution Commonly known as:  XALATAN Place 1 drop into both eyes at bedtime.   simvastatin 10 MG  tablet Commonly known as:  ZOCOR TAKE 1 TABLET (10 MG TOTAL) BY MOUTH DAILY AT 6 PM.   timolol 0.5 % ophthalmic solution Commonly known as:  TIMOPTIC Place 1 drop into both eyes daily.       Disposition and follow-up:   Mr.Miley H Sick was discharged from [facility] in Stable condition.  At the hospital follow up visit please address:  1.   Acute on chronic renal failure -Patient's creatinine on d/c was 6.34, BUN 83 - needs close follow up -Patient auto-diuresing well on discharge, concern for over diuresis and dehydration, please assess volume status and urine output -BMET and CBC -Nephrology referral placed, please discuss scheduling a follow up appointment with nephrology in 2 weeks  Left Hydronephrosis s/p stent placement -tolerated the procedure well, no post pain, foley was d/ced 1 day after procedure -will need urology follow up, please help facilitate the patient's follow up  Acute Combined Systolic and Diastolic Congestive Heart Failure -EF 35%, diffuse hypokinesis, G1DD -responded very well to initial diuresis, SOB and hypoxia resolved on hospital day 2-3 -Initially started on metoprolol but had bradycardia with heart rates in the 40s  -ACE/ARB not started in the setting of acute renal failure -Please help facilitate scheduling an appointment with HF clinic, they had scheduled an appointment for him on 10/31 in [facility] but he was still hospitalized and missed the appointment   Hypertension -Difficulty to control in the setting of acute renal failure and bradycardia -initially put on clonidine and  metoprolol - d/ced both due to sinus bradycardia and 2 second pauses on tele -placed on maximum dose of BiDil and Amlodipine 10 mg -consider repeating an EKG and starting antihypertensive with HF benefit  Normocytic Anemia -Hbg stabilized at ~8.  -Repeat CBC  2.  Labs / imaging needed at time of follow-up: BMET, CBC   3.  Pending labs/ test needing  follow-up: none  Follow-up Appointments: Follow-up Information    [facility]. Go on 08/25/2017.   Specialty:  Cardiology Why:  at 11:30 AM In the [facility] --Please bring all medications to appt.  Gate code 8000 for month of October   Contact information: [address] [id number] [place]       Ceasar Mons, MD Follow up in 10 day(s).   Specialty:  Urology Why:  Follow up  Contact information: [address] [place] [address] [place] Follow up on 08/27/2017.   Why:  2:45 PM. Contact information: [address] [place] [phone]       Madelon Lips, MD. Schedule an appointment as soon as possible for a visit in 2 week(s).   Specialty:  Nephrology Contact information: [place] [place] [address] [place] Hospital Course by problem list: Principal Problem:   Acute combined systolic and diastolic congestive heart failure (HCC) Active Problems:   Essential hypertension   CKD (chronic kidney disease), stage III (HCC)   Acute respiratory failure with hypoxia (HCC)   Bradycardia, drug induced   Ectatic abdominal aorta (HCC)   Abdominal aortic atherosclerosis (HCC)   Hydronephrosis of left kidney   Acute renal failure (HCC)   Secondary hyperparathyroidism of renal origin ([place])   1. Acute Combined Systolic and Diastolic Heart Failure, Acute respiratory failure with hypoxia Mr. Gillingham was admitted to [facility] and the Internal Medicine teaching service for progressive dyspnea in exertion. Upon arrival, the patients vital signs: BP 187/123, HR 107, RR 20, temp 98.2, hypoxic to 85%. Oxygen saturation improved to 95% with supplemental O2. Initial labs were remarkable for BNP 1326, ABG 7.47/34/78, and Cr 2.92. Troponin was  negative. He had no leukocytosis and lactic acid level was normal. D-dimer was mildly elevated at 1.23.  Chest x-ray revealed bilateral patchy basilar infiltrates versus vascular congestion. In the emergency department, he was empirically started on azithromycin and ceftriaxone, and received a 500 ml saline bolus. Antibiotics were stopped and the patient received an IV dose of lasix. Echocardiogram revealed mild LV hypertrophy, EF 35%, diffuse hypokinesis with septal-lateral dyssynchrony, with mild pulmonary hypertension and no significant valvular abnormalities. The patient was diuresed with resolution of his hypoxia and shortness of breath. Metoprolol was started, but subsequently discontinued because of bradycardia.   2. Acute on chronic Renal failure, Left hydorpnephrosis Prior to admission, Mr. Calabria's most recent baseline creatinine was 2.0. On admission his creatinine was elevated to 2.92. After initial diuresis, his creatinine improved to 2.79. Diuresis was held after his creatinine increased to 3.55. Despite holding further diuresis, his creatinine and BUN continued to trend upward: 3.55 > 4.20 > 5.62 > 6.44.Urine analysis was bland and only positive for mild proteinuria. Nephrology was consulted and a renal ultrasound was performed. Renal ultrasound showed moderate left hydronephrosis, and urology was consulted. Urologists performed cystoscopy and found an  obstructing 5 mm left midureteral stone. A stent was placed. The day following the procedure his creatinine increased to 7.41, and a CT abdomen and pelvis was obtained to evaluate for right kidney obstruction. CT was negative for right hydronephrosis, intrarenal calculus or ureteral stone. Urine output improved significantly and his creatinine began to trend down. On discharge his creatinine was 6.34, BUN 83. The patient did not experience signs/symptoms of uremia.   3. Uncontrolled Hypertension, Drug induced bradycardia The patient states  that his blood pressures at home typically range in the 762G systolic. Prior to admission, he was taking lisinopril 40 mg daily and nifedipine 90 mg daily. These medications were stopped in the setting of acute congestive heart failure and renal failure. Blood pressure remained elevated and he was initially started on metoprolol and clonidine. The patient became bradycardic and was having 1-2 second pauses on telemetry so both metoprolol and clonidine were discontinued. The patient was started on Bidil and the dose titrated up as tolerated. Blood pressures improved on BiDil and with the addition of Amlodipine 10 mg, but remained elevated in 315V systolic.    4. Normocytic Anemia  Upon initial presentation, the patients hemoglobin was 11.6, but trended down to 8. He was found to have iron studies consistent with iron deficiency anemia and was given one dose of Feraheme. Patients hemoglobin remained stable at 8.9.   5. Ectatic Abdominal Aorta -incidental finding on CT abdomen -documented in patient's problem list  -will require follow up in 5 years with ultrasound  Discharge Vitals:   BP (!) 173/76 (BP Location: Left Arm)   Pulse 92   Temp 98.1 F (36.7 C)   Resp 16   Ht 6\' 2"  (1.88 m)   Wt 179 lb 14.3 oz (81.6 kg)   SpO2 96%   BMI 23.10 kg/m   Pertinent Labs, Studies, and Procedures:  CBC Latest Ref Rng & Units 08/24/2017 08/23/2017 08/22/2017  WBC 4.0 - 10.5 K/uL 6.2 7.4 5.5  Hemoglobin 13.0 - 17.0 g/dL 8.9(L) 8.3(L) 8.7(L)  Hematocrit 39.0 - 52.0 % 26.8(L) 25.1(L) 27.1(L)  Platelets 150 - 400 K/uL 236 182 187   BMP Latest Ref Rng & Units 08/25/2017 08/24/2017 08/23/2017  Glucose 65 - 99 mg/dL 105(H) 98 99  BUN 6 - 20 mg/dL 83(H) 94(H) 92(H)  Creatinine 0.61 - 1.24 mg/dL 6.34(H) 7.17(H) 7.65(H)  BUN/Creat Ratio 10 - 24 - - -  Sodium 135 - 145 mmol/L 137 135 131(L)  Potassium 3.5 - 5.1 mmol/L 3.5 3.7 3.7  Chloride 101 - 111 mmol/L 101 99(L) 94(L)  CO2 22 - 32 mmol/L 23 23 23     Calcium 8.9 - 10.3 mg/dL 8.5(L) 8.1(L) 8.0(L)   Transthoracic Echocardiogram ------------------------------------------------------------------- Study Conclusions - Left ventricle: The cavity size was normal. Wall thickness was   increased in a pattern of mild LVH. Septal-lateral dyssynchrony   suggestive of LBBB/IVCD. The estimated ejection fraction was 35%.   Diffuse hypokinesis. Doppler parameters are consistent with   abnormal left ventricular relaxation (grade 1 diastolic   dysfunction). - Aortic valve: There was no stenosis. - Aorta: Mildly dilated aortic root. Aortic root dimension: 41 mm   (ED). - Mitral valve: There was mild regurgitation. - Left atrium: The atrium was mildly dilated. - Right ventricle: The cavity size was normal. Systolic function   was normal. - Right atrium: The atrium was mildly dilated. - Tricuspid valve: Peak RV-RA gradient (S): 45 mm Hg. - Pulmonary arteries: PA peak pressure: 48 mm Hg (S). -  Inferior vena cava: The vessel was normal in size. The   respirophasic diameter changes were in the normal range (>= 50%),   consistent with normal central venous pressure.  Impressions: - Normal LV size with mild LV hypertrophy. EF 35%, diffuse   hypokinesis with septal-lateral dyssynchroncy. Normal RV size and   systolic function. Mild pulmonary hypertension. No significant   valvular abnormalities.  RENAL ULTRASOUND IMPRESSION: 1. Increased renal echogenicity in keeping with medical renal disease. Clinical correlation is recommended. 2. Moderate left hydronephrosis.  No stone identified. 3. Septated left renal inferior pole cyst. Follow-up with ultrasound in 3 months recommended.  CT ABDOMEN PELVIS WO CONTRAST IMPRESSION: 1. Left ureteral stent is in place. Mild dilatation of left renal pelvis but no frank hydronephrosis. No definitive stones are seen. 2. Negative for right hydronephrosis, intrarenal calculus or ureteral stone 3. Stable 3.5 cm  pleural based density on the right, benign given lack of interval change. Small pleural effusions and small foci of infection or inflammation at the right middle lobe and right base. 4. 2.7 cm diameter proximal abdominal aorta. Ectatic abdominal aorta at risk for aneurysm development. Recommend followup by ultrasound in 5 years. This recommendation follows ACR consensus guidelines: White Paper of the ACR Incidental Findings Committee II on Vascular Findings. J Am Coll Radiol 2013; 10:789-794.   Discharge Instructions: Discharge Instructions    (HEART FAILURE PATIENTS) Call MD:  Anytime you have any of the following symptoms: 1) 3 pound weight gain in 24 hours or 5 pounds in 1 week 2) shortness of breath, with or without a dry hacking cough 3) swelling in the hands, feet or stomach 4) if you have to sleep on extra pillows at night in order to breathe.    Complete by:  As directed    AMB referral to CHF clinic    Complete by:  As directed    Ambulatory referral to Nephrology    Complete by:  As directed    Hospital Follow up in 2 weeks.   Call MD for:  difficulty breathing, headache or visual disturbances    Complete by:  As directed    Call MD for:  persistant dizziness or light-headedness    Complete by:  As directed    Diet - low sodium heart healthy    Complete by:  As directed    Discharge instructions    Complete by:  As directed    Mr. Spratling,   You admitted for shortness of breath. An ultrasound of your heart revealed a condition called Congestive Heart Failure. This means your heart is not pumping as strong as it had in the past. This cause fluid to accumulate in your lungs, legs, and abdomen. We gave you medicine to help take some of the fluid off and you responded very well. Your blood pressure wasvery difficult to control because your kidneys weren't working well and your heart rate was very low. You were started on two medications: Bidil 20-37.5 mg and Amlodipine 10 mg to  help control your blood pressure. These medicines will be changed once you kidneys start functioning better and your heart rate improves. Please take two tablets of Bidil three times daily. Please take amlodipine once daily. You need to follow up with the Cardiologists to address your heart failure. Please call the number on your paper work to make an appointment.   Your kidneys were also not functioning normally. Imaging of your kidneys showed a stone in your left kidney causing fluid to  back up. A stent was placed to relieve the fluid from your kidney. Please follow up with Urology to address the stone in your left kidney. You will also have to follow up with the Nephrologists (kidney doctors).   Increase activity slowly    Complete by:  As directed       Signed: Melanee Spry, MD 08/25/2017, 4:38 PM   Pager: [phone]

## 2017-08-23 NOTE — Progress Notes (Signed)
08/23/17 1300  PT Visit Information  Last PT Received On 08/23/17  Assistance Needed +1  History of Present Illness [name] [name] is an 83 year old male with a past medical history of hypertension, hyperlipidemia, CKD who presented with chief complaint of progressive dyspnea on exertion last several days.  He reports shortness of breath is worse when lying flat.  He chronically uses 2-3 pillows to prop him up during sleep he cannot remember why he started doing this. Presents with acute combined CHF  Subjective Data  Subjective better today and asked to walk on the hall  Patient Stated Goal get stronger and home  Precautions  Precautions Other (comment) (monitor vitals)  Precaution Comments Watch O2 sats  Restrictions  Weight Bearing Restrictions No  Pain Assessment  Pain Assessment No/denies pain  Cognition  Arousal/Alertness Awake/alert  Behavior During Therapy WFL for tasks assessed/performed  Overall Cognitive Status Within Functional Limits for tasks assessed  Bed Mobility  Overal bed mobility Modified Independent  Transfers  Overall transfer level Modified independent  Equipment used None  Transfers Sit to/from Stand  Sit to Stand Supervision  General transfer comment Supervision for safety  Ambulation/Gait  Ambulation/Gait assistance Min guard  Ambulation Distance (Feet) 125 Feet  Assistive device None  Gait Pattern/deviations Step-through pattern;Wide base of support;Trunk flexed;Decreased stride length  General Gait Details using the dynamap as minor support  Gait velocity reduced  Gait velocity interpretation Below normal speed for age/gender  Balance  Overall balance assessment Needs assistance  Sitting-balance support Feet supported  Sitting balance-Leahy Scale Good  Standing balance support Bilateral upper extremity supported;During functional activity  Standing balance-Leahy Scale Fair  Standing balance comment less than fair dynamic balance   Exercises  Exercises General Lower Extremity  General Exercises - Lower Extremity  Ankle Circles/Pumps AROM;Both;10 reps  Long Arc Quad Strengthening;Both;10 reps  Heel Slides Strengthening;Both;10 reps  Hip ABduction/ADduction Strengthening;Both;10 reps  PT - End of Session  Equipment Utilized During Treatment Gait belt  Activity Tolerance Patient tolerated treatment well  Patient left in bed;with call bell/phone within reach (sitting bedside)  Nurse Communication Mobility status  PT - Assessment/Plan  PT Plan Current plan remains appropriate  PT Visit Diagnosis Unsteadiness on feet (R26.81)  PT Frequency (ACUTE ONLY) Min 3X/week  Follow Up Recommendations Other (comment) (cardiac rehab)  PT equipment Cane  AM-PAC PT "6 Clicks" Daily Activity Outcome Measure  Difficulty turning over in bed (including adjusting bedclothes, sheets and blankets)? 4  Difficulty moving from lying on back to sitting on the side of the bed?  4  Difficulty sitting down on and standing up from a chair with arms (e.g., wheelchair, bedside commode, etc,.)? 4  Help needed moving to and from a bed to chair (including a wheelchair)? 4  Help needed walking in hospital room? 3  Help needed climbing 3-5 steps with a railing?  3  6 Click Score 22  Mobility G Code  CJ  PT Goal Progression  Progress towards PT goals Progressing toward goals  PT Time Calculation  PT Start Time (ACUTE ONLY) 1139  PT Stop Time (ACUTE ONLY) 1205  PT Time Calculation (min) (ACUTE ONLY) 26 min  PT G-Codes **NOT FOR INPATIENT CLASS**  Functional Assessment Tool Used AM-PAC 6 Clicks Basic Mobility  PT General Charges  $$ ACUTE PT VISIT 1 Visit  PT Treatments  $Gait Training 8-22 mins  $Therapeutic Exercise 8-22 mins   Mee Hives, PT MS Acute Rehab Dept. Number: [place] [facility] [place]

## 2017-08-23 NOTE — Progress Notes (Signed)
Internal Medicine Attending:   I saw and examined the patient. I reviewed the resident's note and I agree with the resident's findings and plan as documented in the resident's note. Mr. Sherle Poe denies any complaints.  His son was at his bedside visiting him today we updated him on [name] [name]'s current hospitalization.  On exam Ronrico continues to have bibasilar crackles mildly elevated JVD heart is bradycardic but regular, no lower extremity edema he is alert and oriented.  Assessment and plan Acute combined congestive heart failure -Remains mildly volume overloaded but not requiring supplemental oxygen.  Acute renal failure on CKD stage II, left sided hydronephrosis -Over the weekend it was identified that he had left-sided hydronephrosis which is stent was placed his renal function today has not improved however he feels like he may be increasing his urine output we will monitor this closely appreciate nephrology's help.

## 2017-08-24 LAB — RENAL FUNCTION PANEL
ALBUMIN: 2.4 g/dL — AB (ref 3.5–5.0)
Anion gap: 13 (ref 5–15)
BUN: 94 mg/dL — AB (ref 6–20)
CALCIUM: 8.1 mg/dL — AB (ref 8.9–10.3)
CHLORIDE: 99 mmol/L — AB (ref 101–111)
CO2: 23 mmol/L (ref 22–32)
CREATININE: 7.17 mg/dL — AB (ref 0.61–1.24)
GFR calc Af Amer: 7 mL/min — ABNORMAL LOW (ref >60)
GFR, EST NON AFRICAN AMERICAN: 6 mL/min — AB (ref >60)
Glucose, Bld: 98 mg/dL (ref 65–99)
Phosphorus: 8.1 mg/dL — ABNORMAL HIGH (ref 2.5–4.6)
Potassium: 3.7 mmol/L (ref 3.5–5.1)
SODIUM: 135 mmol/L (ref 135–145)

## 2017-08-24 LAB — CBC
HCT: 26.8 % — ABNORMAL LOW (ref 39.0–52.0)
Hemoglobin: 8.9 g/dL — ABNORMAL LOW (ref 13.0–17.0)
MCH: 30 pg (ref 26.0–34.0)
MCHC: 33.2 g/dL (ref 30.0–36.0)
MCV: 90.2 fL (ref 78.0–100.0)
Platelets: 236 10*3/uL (ref 150–400)
RBC: 2.97 MIL/uL — ABNORMAL LOW (ref 4.22–5.81)
RDW: 12.4 % (ref 11.5–15.5)
WBC: 6.2 10*3/uL (ref 4.0–10.5)

## 2017-08-24 MED ORDER — AMLODIPINE BESYLATE 5 MG PO TABS
5.0000 mg | ORAL_TABLET | Freq: Every day | ORAL | Status: DC
  Administered 2017-08-24: 5 mg via ORAL
  Filled 2017-08-24 (×2): qty 1

## 2017-08-24 NOTE — Plan of Care (Signed)
Problem: Nutrition: Goal: Adequate nutrition will be maintained Outcome: Progressing Per pt. Appetite is good.

## 2017-08-24 NOTE — Progress Notes (Signed)
   Subjective:  Patient states he is doing very well this morning. He denies confusion or shortness of breath. He states it is hard to believe how sick he is because he feels like himself.  He says he is in his normal state of health and feels like he was prior to coming into the hospital.  He endorses good urine output.   Objective:  Vital signs in last 24 hours: Vitals:   08/23/17 1725 08/23/17 2056 08/23/17 2152 08/24/17 0416  BP: (!) 165/70 (!) 170/70  (!) 177/79  Pulse: (!) 54 (!) 57  (!) 54  Resp: 18 18  17   Temp: 98 F (36.7 C) 98.2 F (36.8 C)  98.2 F (36.8 C)  TempSrc: Oral     SpO2: 95% 95%  95%  Weight:   188 lb 8 oz (85.5 kg)   Height:       General: Sitting upright in bed, appears comfortable, NAD HEENT: [place]/AT, EOMI, no scleral icterus, mild JVD Cardiac: Bradycardic, Systolic murmur best appreciated at apex Pulm: Normal effort, mild bibasilar crackles appreciated but stable from previous exams Abd: soft, non tender, non distended, BS normal Ext: extremities well perfused, no peripheral edema Neuro: alert and oriented X3, cranial nerves II-XII grossly intact   Assessment/Plan:  Active Problems:   Essential hypertension   CKD (chronic kidney disease), stage III (HCC)   Acute respiratory failure with hypoxia (HCC)   Acute combined systolic and diastolic congestive heart failure (HCC)   Bradycardia, drug induced   Acute Renal failure on CKD Cr stable at 7.17, BUN 94,  K+ 3.7, Bicarb 23. Urine output has significantly improved with daily total output of  3.2 L. Patient does not appear uremic. Volume status is stable. Will continue to monitor, suspect improvement in creatinine now with diuresis.    -Nephrology consulted, appreciate recs  -Renal function panel daily- closely monitoring  -Strict Ins/Outs  Acute Combined Systolic and diastolic CHF Exacerbation  Patient is asymptomatic without shortness of breath, lung exam stable, mild JVD appreciated. Saturating  high 90s on RA.  Volume status is stable.  -Strict I/Os -Continue Bidil 2 tablets TID for BP management -Telemetry - sinus bradycardia with intermitted 1-2 second pauses  HTN Elevated this AM 209/100 - gave Bidil 1 hour early; waiting on BP recheck. BP meds limited in the setting of acute renal failure and sinus bradycardia. He is already on the maximum dose of Bidil. Will consider additional agent if BP still elevated. -Continue Bidil 2 tablets TID  Normocytic anemia  2/2 to iron deficiency anemia s/p 1 dose of feraheme. Hgb stable at 8.9, MCV 90.2. -continue to monitor with daily CBCs  Hypocalcemia -0.25 mcg calcitriol daily   Dispo: Anticipated discharge in approximately 2-3 days.   Melanee Spry, MD 08/24/2017, 8:50 AM Pager: [phone]

## 2017-08-24 NOTE — Progress Notes (Signed)
Patients BP was 185/73 pulse 51. MD notified. Orders given to recheck in 30 minutes after given BP medications. Orders followed. Will continue to monitor.

## 2017-08-24 NOTE — Care Management Important Message (Signed)
Important Message  Patient Details  Name: [name] [name] MRN: [id number] Date of Birth: [birth year]   Medicare Important Message Given:  Yes    [name] 08/24/2017, 9:18 AM

## 2017-08-24 NOTE — Consult Note (Signed)
[facility] CM Primary Care Navigator  08/24/2017  Nature Vogelsang [facility] Jun 09, 1930 [id number]    Met withpatientat the bedside to identify possible discharge needs.  Patient reports having problem with "shortness of breath" that had led to this admission.  Patient endorses Dr. Tawny Asal with [place] as theprimary care provider.   Patient shared using [facility] [address] obtain medications without difficulty.   Patient states managing hismedications at home with use of "pill box" system filled weekly.  He reports that he was driving prior to admission but children (sons) will providetransportation tohisdoctors'appointments when needed.  Patient lives alone and independent with care prior to admission. He states that sons Lounell, Morris and Dodie) will be able to assist him after discharge if he needed help.  Patient plans to return home when ready and improved.  Patientvoiced understanding to call his primary care provider's officewhen he gets home,for a post discharge follow-upwithin a week or sooner if needed.Patient letter (with PCP's contact number) was provided as his reminder.  Patient was admitted to internal medicine, who felt that his presentation was likely congestive heart failure and showing improvement with diuresis. Patient was seen and evaluated by HF RN and was scheduled for follow-up with the [facility] Clinic after discharge.  Explained to patient about [facility] CM services available for health management at home (mainly HF) and he voiced interest with it. Patient had verbally agreed and opted EMMI HF calls to follow-up recovery at home. Referral made for EMMI HF calls after discharge.  Patient expressed understanding to seekreferral to [facility] care managementfrom primary care provider if deemed necessary and appropriate for further servicesin the future.   [facility] care management information provided  for future needs that may arise.   For additional questions please contact:  Edwena Felty A. Hemi Chacko, BSN, RN-BC [facility] PRIMARY CARE Navigator Cell: [phone]

## 2017-08-24 NOTE — Progress Notes (Signed)
S: 83 year old gentleman with history of prostate cancer, hypertension, CKD, CHF and rapidly worsening renal function.  Patient was feeling better and since this morning, had no complaints. He denies any nausea, vomiting, increased somnolence. His chart is of breath has been improved.  O:BP (!) 209/100   Pulse (!) 59   Temp 98 F (36.7 C) (Oral)   Resp 18   Ht 6\' 2"  (1.88 m)   Wt 188 lb 8 oz (85.5 kg)   SpO2 97%   BMI 24.20 kg/m   Intake/Output Summary (Last 24 hours) at 08/24/17 1340 Last data filed at 08/24/17 1000  Gross per 24 hour  Intake              546 ml  Output             3150 ml  Net            -2604 ml   Intake/Output: I/O last 3 completed shifts: In: 480 [P.O.:480] Out: 3500 [Urine:3500]  Intake/Output this shift:  Total I/O In: 426 [P.O.:420; I.V.:6] Out: 450 [Urine:450] Weight change: -5 lb 3.2 oz (-2.359 kg)   Gen: well-developed elderly man, in no acute distress. CVS: regular rate and rhythm.  Resp: mildly decreased breath sounds at bases bilaterally. Abd: soft, non tender, bowel sounds positive. Ext: trace pedal edema bilaterally.   Recent Labs Lab 08/18/17 0314 08/19/17 0247 08/20/17 0327 08/20/17 1425 08/21/17 0516 08/21/17 0947 08/22/17 0308 08/23/17 0339 08/24/17 0329  NA 137 137 135  --  133*  --  133* 131* 135  K 4.0 3.8 4.2  --  4.1  --  4.8 3.7 3.7  CL 99* 100* 97*  --  97*  --  97* 94* 99*  CO2 28 27 26   --  23  --  22 23 23   GLUCOSE 90 100* 101*  --  106*  --  133* 99 98  BUN 39* 45* 59*  --  68*  --  81* 92* 94*  CREATININE 3.55* 4.20* 5.62*  --  6.44*  --  7.41* 7.65* 7.17*  ALBUMIN  --   --   --   --  2.6* 2.6* 2.4* 2.3* 2.4*  CALCIUM 8.5* 8.4* 8.4* 8.2* 8.3*  --  8.5* 8.0* 8.1*  PHOS  --   --   --  6.1* 6.4*  --  8.8* 8.5* 8.1*  AST  --   --   --   --   --  18  --   --   --   ALT  --   --   --   --   --  16*  --   --   --    Liver Function Tests:  Recent Labs Lab 08/21/17 0947 08/22/17 0308 08/23/17 0339  08/24/17 0329  AST 18  --   --   --   ALT 16*  --   --   --   ALKPHOS 51  --   --   --   BILITOT 0.4  --   --   --   PROT 6.3*  --   --   --   ALBUMIN 2.6* 2.4* 2.3* 2.4*   No results for input(s): LIPASE, AMYLASE in the last 168 hours. No results for input(s): AMMONIA in the last 168 hours. CBC:  Recent Labs Lab 08/21/17 0516 08/22/17 0308 08/23/17 0339 08/24/17 0329  WBC 6.3 5.5 7.4 6.2  HGB 8.8* 8.7* 8.3* 8.9*  HCT 26.9* 27.1* 25.1*  26.8*  MCV 91.2 91.9 91.3 90.2  PLT 167 187 182 236   Cardiac Enzymes: No results for input(s): CKTOTAL, CKMB, CKMBINDEX, TROPONINI in the last 168 hours. CBG: No results for input(s): GLUCAP in the last 168 hours.  Iron Studies: No results for input(s): IRON, TIBC, TRANSFERRIN, FERRITIN in the last 72 hours. Studies/Results: Ct Abdomen Pelvis Wo Contrast  Result Date: 08/22/2017 CLINICAL DATA:  Renal failure EXAM: CT ABDOMEN AND PELVIS WITHOUT CONTRAST TECHNIQUE: Multidetector CT imaging of the abdomen and pelvis was performed following the standard protocol without IV contrast. COMPARISON:  Ultrasound 04/21/2017, MRI 12/15/2010, CT 11/29/2009 FINDINGS: Lower chest: Lung bases demonstrate small scattered foci of ground-glass density in the right middle lobe and right lung base. Partial consolidation in the left lung base. Small bilateral pleural effusions. Stable 3.5 x 2 cm pleural-based density at the right medial base. Mild cardiomegaly. Hepatobiliary: Contracted gallbladder. No calcified stones. No focal hepatic abnormality or biliary dilatation Pancreas: Unremarkable. No pancreatic ductal dilatation or surrounding inflammatory changes. Spleen: Normal in size without focal abnormality. Adrenals/Urinary Tract: Adrenal glands are within normal limits. Right kidney shows no hydronephrosis or stone. A left ureteral stent is in place. There is slight dilatation of left extrarenal pelvis. There are cysts within the left kidney. No definitive stones are  visualized. Distal portion of the stent is in the posterior bladder. A Foley catheter and air are present in the bladder. Stomach/Bowel: Stomach is within normal limits. Appendix appears normal. No evidence of bowel wall thickening, distention, or inflammatory changes. Vascular/Lymphatic: Moderate atherosclerotic calcifications. Suprarenal abdominal aorta measures 2.7 cm. No significantly enlarged lymph nodes Reproductive: Slightly enlarged prostate. Other: Negative for free air or free fluid. Nonspecific perirectal soft tissue stranding. Musculoskeletal: Degenerative changes. No acute or suspicious bone lesion. IMPRESSION: 1. Left ureteral stent is in place. Mild dilatation of left renal pelvis but no frank hydronephrosis. No definitive stones are seen. 2. Negative for right hydronephrosis, intrarenal calculus or ureteral stone 3. Stable 3.5 cm pleural based density on the right, benign given lack of interval change. Small pleural effusions and small foci of infection or inflammation at the right middle lobe and right base. 4. 2.7 cm diameter proximal abdominal aorta. Ectatic abdominal aorta at risk for aneurysm development. Recommend followup by ultrasound in 5 years. This recommendation follows ACR consensus guidelines: White Paper of the ACR Incidental Findings Committee II on Vascular Findings. J Am Coll Radiol 2013; 10:789-794. Electronically Signed   By: Donavan Foil M.D.   On: 08/22/2017 21:32   . albuterol  10 mg Nebulization Once  . amLODipine  5 mg Oral Daily  . aspirin EC  81 mg Oral Daily  . brimonidine  1 drop Both Eyes BID  . calcitRIOL  0.25 mcg Oral Daily  . heparin  5,000 Units Subcutaneous Q8H  . isosorbide-hydrALAZINE  2 tablet Oral TID  . latanoprost  1 drop Both Eyes QHS  . sodium chloride flush  3 mL Intravenous Q12H  . timolol  1 drop Both Eyes Daily    BMET    Component Value Date/Time   NA 135 08/24/2017 0329   NA 140 02/23/2017 1531   K 3.7 08/24/2017 0329   CL 99 (L)  08/24/2017 0329   CO2 23 08/24/2017 0329   GLUCOSE 98 08/24/2017 0329   BUN 94 (H) 08/24/2017 0329   BUN 26 02/23/2017 1531   CREATININE 7.17 (H) 08/24/2017 0329   CREATININE 1.54 (H) 07/03/2014 1409   CALCIUM 8.1 (L) 08/24/2017 0329  CALCIUM 8.2 (L) 08/20/2017 1425   GFRNONAA 6 (L) 08/24/2017 0329   GFRNONAA 41 (L) 09/05/2013 1530   GFRAA 7 (L) 08/24/2017 0329   GFRAA 47 (L) 09/05/2013 1530   CBC    Component Value Date/Time   WBC 6.2 08/24/2017 0329   RBC 2.97 (L) 08/24/2017 0329   HGB 8.9 (L) 08/24/2017 0329   HGB 12.7 (L) 02/23/2017 1531   HCT 26.8 (L) 08/24/2017 0329   HCT 37.5 02/23/2017 1531   PLT 236 08/24/2017 0329   PLT 268 02/23/2017 1531   MCV 90.2 08/24/2017 0329   MCV 91 02/23/2017 1531   MCH 30.0 08/24/2017 0329   MCHC 33.2 08/24/2017 0329   RDW 12.4 08/24/2017 0329   RDW 12.7 02/23/2017 1531   LYMPHSABS 0.6 (L) 08/16/2017 0004   LYMPHSABS 1.4 02/23/2017 1531   MONOABS 0.8 08/16/2017 0004   EOSABS 0.1 08/16/2017 0004   EOSABS 0.1 02/23/2017 1531   BASOSABS 0.0 08/16/2017 0004   BASOSABS 0.1 02/23/2017 1531     Assessment/Plan:  AKI with CKD. Creatinine stable today with mild improvement from 7.65-7.17, GFR remain the same at 7. Patient auto diuresing at a very good rate with a urinary output of of more than 3 L over the past 24 hour. If he continued to make good amount of urine we anticipate improvement in his kidney functions. -Continue monitoring renal function.  HFrEF.Patient remained asymptomatic with no hypoxia or shortness of breath. No hypoxia. -continue to monitor.  Hypertension. Blood pressure Elevated today. He is on maximum dose of Bidil. -Add amlodipine 5 mg daily-can be titrated up. -Keep monitoring.  Anemia. Hemoglobin stable, had 1 dose of Feraheme. -continue monitoring CBC.  Metabolic bone disease. Parathyroid was elevated at 162, due to CK D. - calcitriol 0.25 mcg daily.  Prostate Ca. S/p radiation in 1999 PSA is stable.

## 2017-08-24 NOTE — Anesthesia Postprocedure Evaluation (Signed)
Anesthesia Post Note  Patient: [name] [name]  Procedure(s) Performed: CYSTOSCOPY WITH RETROGRADE PYELOGRAM/URETERAL JJ STENT PLACEMENT (Left Bladder)     Patient location during evaluation: PACU Anesthesia Type: General Level of consciousness: awake, awake and alert and oriented Pain management: pain level controlled Vital Signs Assessment: post-procedure vital signs reviewed and stable Respiratory status: spontaneous breathing, nonlabored ventilation and respiratory function stable Cardiovascular status: blood pressure returned to baseline Anesthetic complications: no    Last Vitals:  Vitals:   08/24/17 1750 08/24/17 2107  BP: (!) 185/73 (!) 172/75  Pulse: (!) 51 (!) 57  Resp:  18  Temp:  37.1 C  SpO2:  97%    Last Pain:  Vitals:   08/24/17 2100  TempSrc:   PainSc: 0-No pain                 Kimberlee Shoun COKER

## 2017-08-24 NOTE — Progress Notes (Signed)
Internal Medicine Attending:   I saw and examined the patient. I reviewed the resident's note and I agree with the resident's findings and plan as documented in the resident's note. Patient continues to be asymptomatic.  He has had good urine output creatinine is overall stable to trending down.  Remains hypertensive on maximal dose of BiDil.  Will add amlodipine 5 mg.

## 2017-08-24 NOTE — Progress Notes (Signed)
CRITICAL VALUE ALERT  Critical Value:   BP209/100  Date & Time Notied:  0815 08/24/17  Provider Notified: Heber [place]  Orders Received/Actions taken: Give BP meds and recheck and notify. Orders followed. Will continue to monitor.

## 2017-08-25 ENCOUNTER — Encounter (HOSPITAL_COMMUNITY): Payer: Medicare Other

## 2017-08-25 ENCOUNTER — Telehealth: Payer: Self-pay

## 2017-08-25 DIAGNOSIS — I700 Atherosclerosis of aorta: Secondary | ICD-10-CM | POA: Diagnosis present

## 2017-08-25 DIAGNOSIS — N19 Unspecified kidney failure: Secondary | ICD-10-CM | POA: Diagnosis not present

## 2017-08-25 DIAGNOSIS — I77811 Abdominal aortic ectasia: Secondary | ICD-10-CM | POA: Diagnosis present

## 2017-08-25 DIAGNOSIS — N2581 Secondary hyperparathyroidism of renal origin: Secondary | ICD-10-CM | POA: Clinically undetermined

## 2017-08-25 LAB — RENAL FUNCTION PANEL
ALBUMIN: 2.7 g/dL — AB (ref 3.5–5.0)
ANION GAP: 13 (ref 5–15)
BUN: 83 mg/dL — AB (ref 6–20)
CHLORIDE: 101 mmol/L (ref 101–111)
CO2: 23 mmol/L (ref 22–32)
Calcium: 8.5 mg/dL — ABNORMAL LOW (ref 8.9–10.3)
Creatinine, Ser: 6.34 mg/dL — ABNORMAL HIGH (ref 0.61–1.24)
GFR, EST AFRICAN AMERICAN: 8 mL/min — AB (ref >60)
GFR, EST NON AFRICAN AMERICAN: 7 mL/min — AB (ref >60)
Glucose, Bld: 105 mg/dL — ABNORMAL HIGH (ref 65–99)
PHOSPHORUS: 6.9 mg/dL — AB (ref 2.5–4.6)
POTASSIUM: 3.5 mmol/L (ref 3.5–5.1)
Sodium: 137 mmol/L (ref 135–145)

## 2017-08-25 MED ORDER — AMLODIPINE BESYLATE 10 MG PO TABS: 10.0000 mg | ORAL_TABLET | Freq: Every day | ORAL | Status: DC

## 2017-08-25 MED ORDER — ASPIRIN 81 MG PO TBEC: 81.0000 mg | DELAYED_RELEASE_TABLET | Freq: Every day | ORAL | 0 refills | Status: DC

## 2017-08-25 MED ORDER — ISOSORB DINITRATE-HYDRALAZINE 20-37.5 MG PO TABS: 2.0000 | ORAL_TABLET | Freq: Three times a day (TID) | ORAL | 0 refills | Status: DC

## 2017-08-25 MED ORDER — AMLODIPINE BESYLATE 10 MG PO TABS
10.0000 mg | ORAL_TABLET | Freq: Every day | ORAL | Status: DC
  Administered 2017-08-25: 10 mg via ORAL
  Filled 2017-08-25: qty 1

## 2017-08-25 MED ORDER — AMLODIPINE BESYLATE 10 MG PO TABS: 10.0000 mg | ORAL_TABLET | Freq: Every day | ORAL | 0 refills | Status: DC

## 2017-08-25 MED ORDER — CALCITRIOL 0.25 MCG PO CAPS: 0.2500 ug | ORAL_CAPSULE | Freq: Every day | ORAL | 0 refills | Status: DC

## 2017-08-25 NOTE — Progress Notes (Signed)
[name] [name] to be D/C'd Home per MD order.  Discussed prescriptions and follow up appointments with the patient. Prescriptions given to patient, medication list explained in detail. Pt verbalized understanding.  Allergies as of 08/25/2017   No Known Allergies     Medication List    STOP taking these medications   lisinopril 40 MG tablet Commonly known as:  PRINIVIL,ZESTRIL   NIFEdipine 90 MG 24 hr tablet Commonly known as:  PROCARDIA XL/ADALAT-CC     TAKE these medications   amLODipine 10 MG tablet Commonly known as:  NORVASC Take 1 tablet (10 mg total) by mouth daily.   aspirin 81 MG EC tablet Take 1 tablet (81 mg total) by mouth daily.   brimonidine 0.2 % ophthalmic solution Commonly known as:  ALPHAGAN Place 1 drop into both eyes 2 (two) times daily.   calcitRIOL 0.25 MCG capsule Commonly known as:  ROCALTROL Take 1 capsule (0.25 mcg total) by mouth daily.   Cholecalciferol 50000 units Tabs Take 50,000 Units by mouth once a week.   diclofenac sodium 1 % Gel Commonly known as:  VOLTAREN Apply 2 g topically 4 (four) times daily.   isosorbide-hydrALAZINE 20-37.5 MG tablet Commonly known as:  BIDIL Take 2 tablets by mouth 3 (three) times daily.   latanoprost 0.005 % ophthalmic solution Commonly known as:  XALATAN Place 1 drop into both eyes at bedtime.   simvastatin 10 MG tablet Commonly known as:  ZOCOR TAKE 1 TABLET (10 MG TOTAL) BY MOUTH DAILY AT 6 PM.   timolol 0.5 % ophthalmic solution Commonly known as:  TIMOPTIC Place 1 drop into both eyes daily.       Vitals:   08/25/17 0850 08/25/17 1521  BP: (!) 156/89 (!) 173/76  Pulse: 92   Resp:    Temp:    SpO2:      Skin clean, dry and intact without evidence of skin break down, no evidence of skin tears noted. IV catheter discontinued intact. Site without signs and symptoms of complications. Dressing and pressure applied. Pt denies pain at this time. No complaints noted.  An After Visit  Summary was printed and given to the patient. Patient escorted via [place], and D/C home via private auto.  [name] RN [facility] [employer]

## 2017-08-25 NOTE — Telephone Encounter (Signed)
Hospital TOC per Dr Aggie Hacker, discharge 08/25/2017, appt 08/27/2017.

## 2017-08-25 NOTE — Progress Notes (Signed)
Internal Medicine Attending:   I saw and examined the patient. I reviewed the resident's note and I agree with the resident's findings and plan as documented in the resident's note. Remains without complaints, would like to go home when possible.  Is urinating frequently.  On exam continues to have some bibasilar crackles but improved, no LE edema.  Heart RRR with systolic murmur. Overall renal function does appear to be improving.  We are continuing to work on his blood pressure, increased amlodipine today, may also continue to improve after his post obstructive diuresis.  Will need to touch base with neprhology to determine feasibility of discharge today given his lab improvement.  He will need close follow up with the [facility] (repeat labs and BP control), as well as urology, heart failure, and nephrology.

## 2017-08-25 NOTE — Progress Notes (Signed)
   Subjective:  Patient feels well this morning. No complaints. He denies shortness of breath, chest pain, nausea, vomiting, or diarrhea. He endorses frequent urination.  Patient feels ready to be discharged home.   Objective:  Vital signs in last 24 hours: Vitals:   08/24/17 2107 08/25/17 0218 08/25/17 0446 08/25/17 0850  BP: (!) 172/75  (!) 193/82 (!) 156/89  Pulse: (!) 57  (!) 57 92  Resp: 18  16   Temp: 98.7 F (37.1 C)  98.1 F (36.7 C)   TempSrc:      SpO2: 97%  96%   Weight: 180 lb (81.6 kg) 179 lb 14.3 oz (81.6 kg)    Height:       General: Sitting upright in bed, appears comfortable, NAD HEENT: [place]/AT, EOMI, no scleral icterus, no JVD Cardiac: Bradycardic, Systolic murmur best appreciated at apex Pulm: Normal effort, minimal bibasilar crackles appreciated, good aeration throughout all lung fields Abd: soft, non tender, non distended, BS normal Ext: extremities well perfused, no peripheral edema Neuro: alert and oriented X3, cranial nerves II-XII grossly intact   Assessment/Plan:  Principal Problem:   Acute combined systolic and diastolic congestive heart failure (HCC) Active Problems:   Essential hypertension   CKD (chronic kidney disease), stage III (HCC)   Acute respiratory failure with hypoxia (HCC)   Bradycardia, drug induced   Ectatic abdominal aorta (HCC)   Abdominal aortic atherosclerosis (HCC)   Hydronephrosis of left kidney   Acute Renal failure on CKD Cr and BUN slowly improving; 6.34, BUN 83,  K+ 3.5, Bicarb 23. Urine output 2.4 L. Patient does not appear uremic. Volume status is stable. Will continue to monitor, suspect improvement in creatinine now with autodiuresis. May be able to discharge later today pending nephrology recs.  -Nephrology consulted, appreciate recs  -Strict Ins/Outs -Renal function panel tomorrow AM if not discharging -Will need nephrology follow up  Hypertension  Difficult to control, consistently elevated with systolic BP  >892; started amlodipine 5 mg yesterday, increased to 10 mg this AM. BP meds limited in the setting of acute renal failure and sinus bradycardia. On the maximum dose of Bidil. Recheck this AM 156/89.  -Continue Bidil 2 tablets TID -Amlodipine 10 mg daily  Acute Combined Systolic and diastolic CHF Exacerbation  Patient is asymptomatic without shortness of breath, lung exam stable, no JVD appreciated. Saturating high 90s on RA.  Volume status is stable.  -Strict I/Os -Continue Bidil 2 tablets TID for BP management -Telemetry - normal sinus rhythm, HR 60s  -Will need to reschedule AHF follow up appointment   Hypocalcemia Ca 8.5, corrected for albumin 9.5. -0.25 mcg calcitriol daily   Dispo: Anticipated discharge in approximately 0-1 days.   Melanee Spry, MD 08/25/2017, 10:49 AM Pager: [phone]

## 2017-08-25 NOTE — Progress Notes (Signed)
S:83 year old gentleman with history of prostate cancer, hypertension, CKD, CHF and rapidly worsening renal function.  Patient was feeling better, had no complaints, wants to go home. Creatinine continued to improve with good urine output.  O:BP (!) 156/89 (BP Location: Left Arm)   Pulse 92   Temp 98.1 F (36.7 C)   Resp 16   Ht 6\' 2"  (1.88 m)   Wt 179 lb 14.3 oz (81.6 kg)   SpO2 96%   BMI 23.10 kg/m   Intake/Output Summary (Last 24 hours) at 08/25/17 1348 Last data filed at 08/25/17 1143  Gross per 24 hour  Intake              189 ml  Output             2825 ml  Net            -2636 ml   Intake/Output: I/O last 3 completed shifts: In: 43 [P.O.:900; I.V.:9] Out: 4200 [Urine:4200]  Intake/Output this shift:  Total I/O In: 186 [P.O.:180; I.V.:6] Out: 1025 [Urine:1025] Weight change: -8 lb 8 oz (-3.856 kg)  Gen: well-developed elderly man, in no acute distress. CVS: regular rate and rhythm with systolic murmur. Resp: clear bilaterally. Abd: soft, non tender, bowel sounds positive. Ext: No lower extremity edema bilaterally.   Recent Labs Lab 08/19/17 0247 08/20/17 0327 08/20/17 1425 08/21/17 0516 08/21/17 0947 08/22/17 0308 08/23/17 0339 08/24/17 0329 08/25/17 0316  NA 137 135  --  133*  --  133* 131* 135 137  K 3.8 4.2  --  4.1  --  4.8 3.7 3.7 3.5  CL 100* 97*  --  97*  --  97* 94* 99* 101  CO2 27 26  --  23  --  22 23 23 23   GLUCOSE 100* 101*  --  106*  --  133* 99 98 105*  BUN 45* 59*  --  68*  --  81* 92* 94* 83*  CREATININE 4.20* 5.62*  --  6.44*  --  7.41* 7.65* 7.17* 6.34*  ALBUMIN  --   --   --  2.6* 2.6* 2.4* 2.3* 2.4* 2.7*  CALCIUM 8.4* 8.4* 8.2* 8.3*  --  8.5* 8.0* 8.1* 8.5*  PHOS  --   --  6.1* 6.4*  --  8.8* 8.5* 8.1* 6.9*  AST  --   --   --   --  18  --   --   --   --   ALT  --   --   --   --  16*  --   --   --   --    Liver Function Tests:  Recent Labs Lab 08/21/17 0947  08/23/17 0339 08/24/17 0329 08/25/17 0316  AST 18  --   --    --   --   ALT 16*  --   --   --   --   ALKPHOS 51  --   --   --   --   BILITOT 0.4  --   --   --   --   PROT 6.3*  --   --   --   --   ALBUMIN 2.6*  < > 2.3* 2.4* 2.7*  < > = values in this interval not displayed. No results for input(s): LIPASE, AMYLASE in the last 168 hours. No results for input(s): AMMONIA in the last 168 hours. CBC:  Recent Labs Lab 08/21/17 0516 08/22/17 0308 08/23/17 0339 08/24/17 0329  WBC  6.3 5.5 7.4 6.2  HGB 8.8* 8.7* 8.3* 8.9*  HCT 26.9* 27.1* 25.1* 26.8*  MCV 91.2 91.9 91.3 90.2  PLT 167 187 182 236   Cardiac Enzymes: No results for input(s): CKTOTAL, CKMB, CKMBINDEX, TROPONINI in the last 168 hours. CBG: No results for input(s): GLUCAP in the last 168 hours.  Iron Studies: No results for input(s): IRON, TIBC, TRANSFERRIN, FERRITIN in the last 72 hours. Studies/Results: No results found. Marland Kitchen amLODipine  10 mg Oral Daily  . aspirin EC  81 mg Oral Daily  . brimonidine  1 drop Both Eyes BID  . calcitRIOL  0.25 mcg Oral Daily  . heparin  5,000 Units Subcutaneous Q8H  . isosorbide-hydrALAZINE  2 tablet Oral TID  . latanoprost  1 drop Both Eyes QHS  . sodium chloride flush  3 mL Intravenous Q12H  . timolol  1 drop Both Eyes Daily    BMET    Component Value Date/Time   NA 137 08/25/2017 0316   NA 140 02/23/2017 1531   K 3.5 08/25/2017 0316   CL 101 08/25/2017 0316   CO2 23 08/25/2017 0316   GLUCOSE 105 (H) 08/25/2017 0316   BUN 83 (H) 08/25/2017 0316   BUN 26 02/23/2017 1531   CREATININE 6.34 (H) 08/25/2017 0316   CREATININE 1.54 (H) 07/03/2014 1409   CALCIUM 8.5 (L) 08/25/2017 0316   CALCIUM 8.2 (L) 08/20/2017 1425   GFRNONAA 7 (L) 08/25/2017 0316   GFRNONAA 41 (L) 09/05/2013 1530   GFRAA 8 (L) 08/25/2017 0316   GFRAA 47 (L) 09/05/2013 1530   CBC    Component Value Date/Time   WBC 6.2 08/24/2017 0329   RBC 2.97 (L) 08/24/2017 0329   HGB 8.9 (L) 08/24/2017 0329   HGB 12.7 (L) 02/23/2017 1531   HCT 26.8 (L) 08/24/2017 0329   HCT  37.5 02/23/2017 1531   PLT 236 08/24/2017 0329   PLT 268 02/23/2017 1531   MCV 90.2 08/24/2017 0329   MCV 91 02/23/2017 1531   MCH 30.0 08/24/2017 0329   MCHC 33.2 08/24/2017 0329   RDW 12.4 08/24/2017 0329   RDW 12.7 02/23/2017 1531   LYMPHSABS 0.6 (L) 08/16/2017 0004   LYMPHSABS 1.4 02/23/2017 1531   MONOABS 0.8 08/16/2017 0004   EOSABS 0.1 08/16/2017 0004   EOSABS 0.1 02/23/2017 1531   BASOSABS 0.0 08/16/2017 0004   BASOSABS 0.1 02/23/2017 1531     Assessment/Plan:  AKI with CKD. Creatinine continue to improve with good urinary output, auto diuresing very well. And we anticipate improvement in his kidney functions if he continued to diurese. -He can be discharged home if his kidney functions continued to improve with a close follow-up with primary care and nephrology. He will need a renal function monitoring on follow-up.  HFrEF.Patient remained asymptomatic with no hypoxia or shortness of breath. No hypoxia. -continue to monitor.  Hypertension. Blood pressure Elevated today. He is on maximum dose of Bidil.Amlodipine 5 mg was added yesterday and dose was increased to 10 mg today. -Keep monitoring.  Anemia. Hemoglobin stable, had 1 dose of Feraheme. -continue monitoring CBC.  Metabolic bone disease. Parathyroid was elevated at 162, due to CK D. - calcitriol 0.25 mcg daily.  Prostate Ca. S/p radiation in 1999 PSA is stable.   Lorella Nimrod

## 2017-08-27 ENCOUNTER — Ambulatory Visit (INDEPENDENT_AMBULATORY_CARE_PROVIDER_SITE_OTHER): Payer: Medicare Other | Admitting: Internal Medicine

## 2017-08-27 ENCOUNTER — Encounter: Payer: Self-pay | Admitting: Internal Medicine

## 2017-08-27 VITALS — BP 178/81 | HR 66 | Temp 98.1°F | Ht 73.0 in | Wt 179.7 lb

## 2017-08-27 DIAGNOSIS — Z969 Presence of functional implant, unspecified: Secondary | ICD-10-CM

## 2017-08-27 DIAGNOSIS — I5041 Acute combined systolic (congestive) and diastolic (congestive) heart failure: Secondary | ICD-10-CM

## 2017-08-27 DIAGNOSIS — Z5189 Encounter for other specified aftercare: Secondary | ICD-10-CM | POA: Diagnosis not present

## 2017-08-27 DIAGNOSIS — Z79899 Other long term (current) drug therapy: Secondary | ICD-10-CM

## 2017-08-27 DIAGNOSIS — N179 Acute kidney failure, unspecified: Secondary | ICD-10-CM

## 2017-08-27 DIAGNOSIS — N183 Chronic kidney disease, stage 3 (moderate): Secondary | ICD-10-CM | POA: Diagnosis not present

## 2017-08-27 DIAGNOSIS — Z8249 Family history of ischemic heart disease and other diseases of the circulatory system: Secondary | ICD-10-CM | POA: Diagnosis not present

## 2017-08-27 DIAGNOSIS — I10 Essential (primary) hypertension: Secondary | ICD-10-CM

## 2017-08-27 DIAGNOSIS — I878 Other specified disorders of veins: Secondary | ICD-10-CM

## 2017-08-27 DIAGNOSIS — Z87891 Personal history of nicotine dependence: Secondary | ICD-10-CM

## 2017-08-27 DIAGNOSIS — I130 Hypertensive heart and chronic kidney disease with heart failure and stage 1 through stage 4 chronic kidney disease, or unspecified chronic kidney disease: Secondary | ICD-10-CM | POA: Diagnosis not present

## 2017-08-27 MED ORDER — HYDRALAZINE HCL 25 MG PO TABS: 37.5000 mg | ORAL_TABLET | Freq: Three times a day (TID) | ORAL | 3 refills | Status: DC

## 2017-08-27 MED ORDER — ISOSORBIDE DINITRATE 20 MG PO TABS: 20.0000 mg | ORAL_TABLET | Freq: Three times a day (TID) | ORAL | 3 refills | Status: DC

## 2017-08-27 NOTE — Progress Notes (Signed)
   CC: hospital follow up, to address HFrEF, acute kidney injurcy on CKD III, HTN  HPI:  [name] [name] is a 83 y.o. Continues to deny symptoms of shortness of breath, no chest pain, no lower extremity edema.  He has been weighing himself, reports good urine output, feels he has lost one pound since discharge from the hospital.    Please see A&P for status of the patient's chronic medical conditions  Past Medical History:  Diagnosis Date  . CHF (congestive heart failure) ([place])   . Heart murmur   . History of gout ~ 1998  . Hyperlipidemia   . Hypertension   . Prostate cancer ([place]) ~ 1998   S/P radiation   Review of Systems:  ROS: Pulmonary: pt denies increased work of breathing, shortness of breath,  Cardiac: pt denies palpitations, chest pain,  Abdominal: pt denies abdominal pain, nausea, vomiting, or diarrhea  Physical Exam:  Vitals:   08/27/17 1438  BP: (!) 178/81  Pulse: 66  Temp: 98.1 F (36.7 C)  TempSrc: Oral  SpO2: 95%  Weight: 179 lb 11.2 oz (81.5 kg)  Height: 6\' 1"  (1.854 m)   Physical Exam  Constitutional: He appears well-developed and well-nourished.  Eyes: Right eye exhibits no discharge. Left eye exhibits no discharge. No scleral icterus.  Neck: JVD (minimal about 6cm H2O) present.  Cardiovascular: Normal rate, regular rhythm, normal heart sounds and intact distal pulses. Exam reveals no gallop and no friction rub.  No murmur heard. Pulmonary/Chest: Effort normal and breath sounds normal. No respiratory distress. He has no wheezes. He has no rales.  Abdominal: Soft. Bowel sounds are normal. He exhibits no distension and no mass. There is no tenderness. There is no guarding.  Neurological: He is alert.    Social History   Social History  . Marital status: Married    Spouse name: N/A  . Number of children: N/A  . Years of education: N/A   Occupational History  . Not on file.   Social History Main Topics  . Smoking status: Former Smoker      Packs/day: 1.00    Years: 46.00    Types: Cigarettes    Quit date: 1998  . Smokeless tobacco: Never Used  . Alcohol use No  . Drug use: No  . Sexual activity: Not on file   Other Topics Concern  . Not on file   Social History Narrative  . No narrative on file    Family History  Problem Relation Age of Onset  . Hypertension Father   . Hypertension Sister   . Hypertension Brother     Assessment & Plan:   See Encounters Tab for problem based charting.  Patient seen with Dr. Rebeca Alert

## 2017-08-27 NOTE — Patient Instructions (Addendum)
I have made you a new appointment in the Advanced Heart failure clinic for the 6th of November at 11:30 am the gate code is 8001.  I have also written you a new prescription for your blood pressure medicine.  Please let us know if you have any trouble getting it.  Continue to check your weights like you have been doing.  If you feel yourself gaining weight, retaining fluid or becoming short of breath let us know and we can get you in to be seen at our clinic earlier than your follow up appointment.

## 2017-08-28 ENCOUNTER — Other Ambulatory Visit: Payer: Self-pay | Admitting: Internal Medicine

## 2017-08-28 LAB — BMP8+ANION GAP
Anion Gap: 22 mmol/L — ABNORMAL HIGH (ref 10.0–18.0)
BUN/Creatinine Ratio: 13 (ref 10–24)
BUN: 77 mg/dL (ref 8–27)
CO2: 21 mmol/L (ref 20–29)
Calcium: 9.6 mg/dL (ref 8.6–10.2)
Chloride: 97 mmol/L (ref 96–106)
Creatinine, Ser: 5.84 mg/dL — ABNORMAL HIGH (ref 0.76–1.27)
GFR calc Af Amer: 9 mL/min/{1.73_m2} — ABNORMAL LOW (ref >59)
GFR calc non Af Amer: 8 mL/min/{1.73_m2} — ABNORMAL LOW (ref >59)
Glucose: 101 mg/dL — ABNORMAL HIGH (ref 65–99)
Potassium: 3.9 mmol/L (ref 3.5–5.2)
Sodium: 140 mmol/L (ref 134–144)

## 2017-08-28 LAB — PHOSPHORUS: Phosphorus: 6.3 mg/dL — ABNORMAL HIGH (ref 2.5–4.5)

## 2017-08-29 NOTE — Assessment & Plan Note (Addendum)
BP Readings from Last 3 Encounters:  08/27/17 (!) 178/81  08/25/17 (!) 173/76  02/23/17 135/62   Patient's blood pressure continues to be elevated today. Unfortunately he was prescribed BIDIL on discharge which was not covered by medicare and he was unable to get this medicine.  His renal acting antihypertensives have been discontinued due to kidney injury and his beta blockers d/ced due to bradycardia so his only BP medicine before this visit was amlodipine 10mg .    -isosorbide dinitrate and hydralazine are covered if written for separately so I prescribed these medications separately with a ratio of 20-37.5mg  and explained this to the patient -I also scheduled the patient for a follow up appointment with advanced heart failure for Nov 6th since he missed his original appointment while he was in the hospital.

## 2017-08-29 NOTE — Assessment & Plan Note (Addendum)
During recent hospitilization for SOB where HFrEF diagnosed, pt suffered from acute renal failure superimposed on CKDIII.  Nephrology stated they were unsure of etiology.  Renal ultrasound showed some hydronephrosis of left kidney and a stent was placed but seemingly had little benefit.  FENa was 0.2% and patient was over diuresed for heart failure, suspect prerenal eitology.  Once diuretics were held pt began to have slow improvement and autodiuresis, pt was never given fluids due to concerns about his heart failure status and reduced EF.    BMP Latest Ref Rng & Units 08/27/2017 08/25/2017 08/24/2017  Glucose 65 - 99 mg/dL 101(H) 105(H) 98  BUN 8 - 27 mg/dL 77(HH) 83(H) 94(H)  Creatinine 0.76 - 1.27 mg/dL 5.84(H) 6.34(H) 7.17(H)  BUN/Creat Ratio 10 - 24 13 - -  Sodium 134 - 144 mmol/L 140 137 135  Potassium 3.5 - 5.2 mmol/L 3.9 3.5 3.7  Chloride 96 - 106 mmol/L 97 101 99(L)  CO2 20 - 29 mmol/L 21 23 23   Calcium 8.6 - 10.2 mg/dL 9.6 8.5(L) 8.1(L)    Patient continues to report good urine output at home and seems to continue autodiuresing.  His BMP here in the office today shows continued improvement. Volume status looks good today.  -called [facility] to set up an appointment for the patient in the next two weeks.  They wanted to talk to Dr. Hollie Salk first and see if she was available but promised to call the patient and then myself to confirm he had the appointment. -continue to hold nephrotoxic drugs and work on controlling blood pressure.

## 2017-08-29 NOTE — Assessment & Plan Note (Addendum)
Patient was recently admitted for SOB found to have EF of 35% with diffuse hypokinesis and G1DD/ mild PAH.  He was diuresed with symptomatic improvement, and no signs of fluid overload on physical exam.  It was thought he may have some more fluid on board by ultrasound so he was diuresed further with worsening of his renal function.   Today he remains asymptomatic, no shortness of breath, clear lungs, minimal JVD, no LE edema.  He reports good urine output and appears to continue autodiuresing since renal injury. He is limiting his fluid intake as instructed.  His blood pressure is high because he was unable to get his BIDIL, his renal acting antihypertensives are held in setting of acute kidney injury, and his BB held due to bradycardia, so he is only on amlodipine 10mg  currently.  -Wrote separate prescriptions for iso dinitrate and hydralazine so medicare would cover it -scheduled an appointment with advanced heart failure for 08/31/17 due to pt missing his appointment during extension of his hospitilization due to kidney injury.

## 2017-08-30 ENCOUNTER — Telehealth (HOSPITAL_COMMUNITY): Payer: Self-pay

## 2017-08-30 NOTE — Progress Notes (Signed)
Internal Medicine Clinic Attending  I saw and evaluated the patient.  I personally confirmed the key portions of the history and exam documented by Dr. Shan Levans and I reviewed pertinent patient test results.  The assessment, diagnosis, and plan were formulated together and I agree with the documentation in the resident's note.  Here for hospital follow up, no further heart failure symptoms. AKI slowly improving with reported good urine output. Will need close follow up with cardiology and nephrology.  Oda Kilts, MD

## 2017-08-30 NOTE — Telephone Encounter (Signed)
CHF Clinic appointment reminder call placed to patient for upcoming post-hospital follow up.  Does understand purpose of this appointment and where CHF Clinic is located? Yes  How is patient feeling? Well, no compaints  Does patient have all of their medications since their recent discharge? Yes  Patient also reminded to take all medications as prescribed on the day of his/her appointment and to bring all medications to this appointment.  Advised to call our office for tardiness or cancellations/rescheduling needs.  Leory Plowman, Guinevere Ferrari

## 2017-08-31 ENCOUNTER — Other Ambulatory Visit: Payer: Self-pay

## 2017-08-31 ENCOUNTER — Ambulatory Visit (HOSPITAL_COMMUNITY)
Admission: RE | Admit: 2017-08-31 | Discharge: 2017-08-31 | Disposition: A | Discharge status: Home / Self Care | Payer: Medicare Other | Source: Ambulatory Visit | Attending: Internal Medicine | Admitting: Internal Medicine

## 2017-08-31 ENCOUNTER — Telehealth (HOSPITAL_COMMUNITY): Payer: Self-pay | Admitting: Adult Health

## 2017-08-31 VITALS — BP 154/98 | HR 52 | Wt 181.0 lb

## 2017-08-31 DIAGNOSIS — I10 Essential (primary) hypertension: Secondary | ICD-10-CM | POA: Diagnosis not present

## 2017-08-31 DIAGNOSIS — Z8546 Personal history of malignant neoplasm of prostate: Secondary | ICD-10-CM | POA: Insufficient documentation

## 2017-08-31 DIAGNOSIS — R001 Bradycardia, unspecified: Secondary | ICD-10-CM | POA: Diagnosis not present

## 2017-08-31 DIAGNOSIS — E785 Hyperlipidemia, unspecified: Secondary | ICD-10-CM | POA: Insufficient documentation

## 2017-08-31 DIAGNOSIS — I5022 Chronic systolic (congestive) heart failure: Secondary | ICD-10-CM | POA: Insufficient documentation

## 2017-08-31 DIAGNOSIS — N184 Chronic kidney disease, stage 4 (severe): Secondary | ICD-10-CM | POA: Diagnosis not present

## 2017-08-31 DIAGNOSIS — I5041 Acute combined systolic (congestive) and diastolic (congestive) heart failure: Secondary | ICD-10-CM | POA: Diagnosis not present

## 2017-08-31 DIAGNOSIS — I130 Hypertensive heart and chronic kidney disease with heart failure and stage 1 through stage 4 chronic kidney disease, or unspecified chronic kidney disease: Secondary | ICD-10-CM | POA: Diagnosis not present

## 2017-08-31 DIAGNOSIS — M109 Gout, unspecified: Secondary | ICD-10-CM | POA: Diagnosis not present

## 2017-08-31 DIAGNOSIS — Z87891 Personal history of nicotine dependence: Secondary | ICD-10-CM | POA: Insufficient documentation

## 2017-08-31 DIAGNOSIS — D649 Anemia, unspecified: Secondary | ICD-10-CM | POA: Insufficient documentation

## 2017-08-31 DIAGNOSIS — I5042 Chronic combined systolic (congestive) and diastolic (congestive) heart failure: Secondary | ICD-10-CM | POA: Diagnosis not present

## 2017-08-31 DIAGNOSIS — Z7982 Long term (current) use of aspirin: Secondary | ICD-10-CM | POA: Insufficient documentation

## 2017-08-31 DIAGNOSIS — Z8249 Family history of ischemic heart disease and other diseases of the circulatory system: Secondary | ICD-10-CM | POA: Insufficient documentation

## 2017-08-31 DIAGNOSIS — Z79899 Other long term (current) drug therapy: Secondary | ICD-10-CM | POA: Insufficient documentation

## 2017-08-31 DIAGNOSIS — R0602 Shortness of breath: Secondary | ICD-10-CM | POA: Diagnosis not present

## 2017-08-31 MED ORDER — HYDRALAZINE HCL 50 MG PO TABS: 50.0000 mg | ORAL_TABLET | Freq: Three times a day (TID) | ORAL | 3 refills | Status: DC

## 2017-08-31 NOTE — Telephone Encounter (Signed)
User: [name] [name] A Date/time: 08/31/17 1:04 PM  Comment: Called pt to get him scheduled for a myoview, but there was no VM set up.   Context:  Outcome: No Answer/Busy  Phone number: [phone] Phone Type: Home Phone  Comm. type: Telephone Call type: Outgoing  Contact: Ty Hilts H Relation to patient: Self

## 2017-08-31 NOTE — Patient Instructions (Addendum)
INCREASE Hydralazine to 50 mg Three Times Daily  Stress Test has been ordered for you, we will schedule at checkout.  Follow up in 4 weeks.

## 2017-08-31 NOTE — Progress Notes (Signed)
ADVANCED HF CLINIC CONSULT NOTE  Referring Physician: Primary Care: Dr Johny Chess  Primary Cardiologist: None  Nephrology: Dr Hollie Salk  Urology: Dr Lovena Neighbours  HPI: [name] [name] is an 83 year old with history of CKD Stage IV, left hydronephrosis with stent placement, HTN, anemia.   Admitted to [facility] 08/16/2017  with increased shortness of breath. Diuresed with IV lasix. Placed on metoprolol but later stopped due to bradycardia. Hospital course was complicated by A/C renal failure. Had left hydronephrosis with stent placed. Creatinine on the day discharge was 6.34. ECHO completed and showed EF 35%. He was not discharged on diuretics. Discharge weight 180 pounds.   Today he presents to HF clinic as new patient at the request of Dr Heber [place]. Overall feeling fine. Denies SOB/PND/Orthopnea. No chest pain. Appetite ok. No fever or chills. Weight at home 170 pounds. Taking all medications. Retired lives alone.   ECHO 07/2017 EF 35% Grade I DD RA mildly dilated RV normal.   Review of Systems: [y] = yes, [ ]  = no   General: Weight gain [ ] ; Weight loss [ ] ; Anorexia [ ] ; Fatigue [ ] ; Fever [ ] ; Chills [ ] ; Weakness [ ]   Cardiac: Chest pain/pressure [ ] ; Resting SOB [ ] ; Exertional SOB [ ] ; Orthopnea [ ] ; Pedal Edema [ ] ; Palpitations [ ] ; Syncope [ ] ; Presyncope [ ] ; Paroxysmal nocturnal dyspnea[ ]   Pulmonary: Cough [ ] ; Wheezing[ ] ; Hemoptysis[ ] ; Sputum [ ] ; Snoring [ ]   GI: Vomiting[ ] ; Dysphagia[ ] ; Melena[ ] ; Hematochezia [ ] ; Heartburn[ ] ; Abdominal pain [ ] ; Constipation [ ] ; Diarrhea [ ] ; BRBPR [ ]   GU: Hematuria[ ] ; Dysuria [ ] ; Nocturia[ ]   Vascular: Pain in legs with walking [ ] ; Pain in feet with lying flat [ ] ; Non-healing sores [ ] ; Stroke [ ] ; TIA [ ] ; Slurred speech [ ] ;  Neuro: Headaches[ ] ; Vertigo[ ] ; Seizures[ ] ; Paresthesias[ ] ;Blurred vision [ ] ; Diplopia [ ] ; Vision changes [ ]   Ortho/Skin: Arthritis Blue.Reese ]; Joint pain [Y ]; Muscle pain [ ] ; Joint swelling [ ] ; Back Pain [ ] ; Rash [ ]     Psych: Depression[ ] ; Anxiety[ ]   Heme: Bleeding problems [ ] ; Clotting disorders [ ] ; Anemia [ ]   Endocrine: Diabetes [ ] ; Thyroid dysfunction[ ]    Past Medical History:  Diagnosis Date  . CHF (congestive heart failure) ([place])   . Heart murmur   . History of gout ~ 1998  . Hyperlipidemia   . Hypertension   . Prostate cancer ([place]) ~ 1998   S/P radiation    Current Outpatient Medications  Medication Sig Dispense Refill  . amLODipine (NORVASC) 10 MG tablet Take 1 tablet (10 mg total) by mouth daily. 30 tablet 0  . aspirin EC 81 MG EC tablet Take 1 tablet (81 mg total) by mouth daily. 30 tablet 0  . brimonidine (ALPHAGAN) 0.2 % ophthalmic solution Place 1 drop into both eyes 2 (two) times daily.  3  . calcitRIOL (ROCALTROL) 0.25 MCG capsule Take 1 capsule (0.25 mcg total) by mouth daily. 30 capsule 0  . Cholecalciferol 50000 units TABS Take 50,000 Units by mouth once a week. 8 tablet 0  . diclofenac sodium (VOLTAREN) 1 % GEL Apply 2 g topically 4 (four) times daily. 1 Tube 1  . hydrALAZINE (APRESOLINE) 25 MG tablet Take 1.5 tablets (37.5 mg total) by mouth 3 (three) times daily. 135 tablet 3  . isosorbide dinitrate (ISORDIL) 20 MG tablet Take 1 tablet (20 mg total) by  mouth 3 (three) times daily. 90 tablet 3  . latanoprost (XALATAN) 0.005 % ophthalmic solution Place 1 drop into both eyes at bedtime.     . simvastatin (ZOCOR) 10 MG tablet TAKE 1 TABLET (10 MG TOTAL) BY MOUTH DAILY AT 6 PM. 90 tablet 2  . timolol (TIMOPTIC) 0.5 % ophthalmic solution Place 1 drop into both eyes daily.  4   No current facility-administered medications for this encounter.     No Known Allergies    Social History   Socioeconomic History  . Marital status: Married    Spouse name: Not on file  . Number of children: Not on file  . Years of education: Not on file  . Highest education level: Not on file  Social Needs  . Financial resource strain: Not on file  . Food insecurity - worry: Not on file   . Food insecurity - inability: Not on file  . Transportation needs - medical: Not on file  . Transportation needs - non-medical: Not on file  Occupational History  . Not on file  Tobacco Use  . Smoking status: Former Smoker    Packs/day: 1.00    Years: 46.00    Pack years: 46.00    Types: Cigarettes    Last attempt to quit: 1998    Years since quitting: 20.8  . Smokeless tobacco: Never Used  Substance and Sexual Activity  . Alcohol use: No    Alcohol/week: 0.0 oz  . Drug use: No  . Sexual activity: Not on file  Other Topics Concern  . Not on file  Social History Narrative  . Not on file      Family History  Problem Relation Age of Onset  . Hypertension Father   . Hypertension Sister   . Hypertension Brother     Vitals:   08/31/17 1121 08/31/17 1128  BP:  (!) 154/98  Pulse:  (!) 52  SpO2:  100%  Weight: 181 lb (82.1 kg) 181 lb (82.1 kg)    PHYSICAL EXAM: General:  Well appearing.Looks younger than stated age.  No respiratory difficulty. Ambulated in the clinic without difficulty.  HEENT: normal Neck: supple. no JVD. Carotids 2+ bilat; no bruits. No lymphadenopathy or thryomegaly appreciated. Cor: PMI laterally displaced. Regular rate & rhythm. No rubs, gallops or murmurs. Lungs: clear Abdomen: soft, nontender, nondistended. No hepatosplenomegaly. No bruits or masses. Good bowel sounds. Extremities: no cyanosis, clubbing, rash, edema Neuro: alert & oriented x 3, cranial nerves grossly intact. moves all 4 extremities w/o difficulty. Affect pleasant.  ECG: Sinus Bradycardia 52 bpm PR 248 ms.   ASSESSMENT & PLAN: 1. Chronic Systolic Heart Failure  -ECHO EF 07/2017 EF 35% RV normal. ? Hypertensive CM -Has not had ischemic work up with due to elevated renal function. Set up lexiscan myoview to assess for ischemic CM.  -NYHA II. Volume status stable. Does not require lasix.  - No bb with bradycardia. -No Ace/dig/Spiro with elevated renal function.  - Increase  hydralazine 50 mg three times a day - Continue current dose of isordil.   2. HTN Elevated. Increase hydralazine to 50 mg three times a day. Continue isordil 20 mg three times a day.  Continue amlodipine 10 mg daily.   3. Left Hypronephrosis S/P stent 07/2017  Has follow up with urology.   4. CKD Stage III Creatinine on discharge 6.3>5. 8  Has follow up with Dr Hollie Salk.   Follow up 4 weeks.   Amy Clegg NP-C  12:12 PM  Patient seen and examined with Darrick Grinder, NP. We discussed all aspects of the encounter. I agree with the assessment and plan as stated above.   83 y/o male with CKD4 recently admitted with volume overload and AKI. Now improved. BP remains high. Agree with titrating hydralazine. Not candidate for cath with renal failure. Will proceed with Myoview to exclude high-grade ischemia. Volume status looks good. Check labs today.  Glori Bickers, MD  9:29 PM

## 2017-09-02 ENCOUNTER — Other Ambulatory Visit: Payer: Self-pay

## 2017-09-02 NOTE — Patient Outreach (Signed)
[facility]) Care Management  09/02/2017  [name] [name] [birth year] [id number]   EMMI: Heart failure Referral date: 09/02/17 Referral source: EMMI heart failure red alert Referral reason: Lost interest in things they use to enjoy Day # 6  Telephone call to patient regarding EMMI heart failure red alert. HIPAA verified with patient. Discussed EMMI heart failure program with patient. Patient states he has not lost interest in doing things. Patient states he was in the hospital due to shortness of breath and kidney stone.  Patient states he answered, "no" on the recording.  Patient states he saw his primary MD on 08/27/17. Patient states he has seen his cardiologist since he has been discharged from the hospital.  Patient states he has all of his medications and he Is taking them as prescribed. Patient states he is able to afford his medications. Patient states he has transportation to his appointments. Patient states he is independent in his care. Patient states he is aware of how to manage his heart failure. RNCM discussed and offered [facility] cared management services. Patient refused services. Patient verbally agreed to received [facility] care management brochure. Patient request 24 hours nurse call line number be mailed.  ASSESSMENT: Per patients discharge note:  Date of Admission: 08/16/2017 12:19 AM Date of Discharge: 08/25/2017 Discharge Diagnosis: 1. Acute Combined Systolic and Diastolic Congestive Heart Failure 2. Acute on chronic renal failure Principal Problem:   Acute combined systolic and diastolic congestive heart failure (HCC) Active Problems:   Essential hypertension   CKD (chronic kidney disease), stage III (HCC)   Acute respiratory failure with hypoxia (HCC)   Bradycardia, drug induced   Ectatic abdominal aorta (HCC)   Abdominal aortic atherosclerosis (HCC)   Hydronephrosis of left kidney   Acute renal failure (HCC)   Secondary hyperparathyroidism of renal  [facility])  PLAN:  RNCM will refer patient to care management assistant to close due to patient refusing services.  RNCM will send patient [facility] care management brochure and outreach letter.   Quinn Plowman RN,BSN,CCM [facility] Telephonic  [phone]

## 2017-09-21 ENCOUNTER — Telehealth (HOSPITAL_COMMUNITY): Payer: Self-pay | Admitting: *Deleted

## 2017-09-21 NOTE — Telephone Encounter (Signed)
Attempted to call patient regarding upcoming nuclear appointment- no answer. Kirstie Peri, RN

## 2017-09-22 ENCOUNTER — Other Ambulatory Visit: Payer: Self-pay | Admitting: Internal Medicine

## 2017-09-24 ENCOUNTER — Encounter (HOSPITAL_COMMUNITY): Payer: Medicare Other

## 2017-09-28 ENCOUNTER — Ambulatory Visit (HOSPITAL_COMMUNITY)
Admission: RE | Admit: 2017-09-28 | Discharge: 2017-09-28 | Disposition: A | Discharge status: Home / Self Care | Payer: Medicare Other | Source: Ambulatory Visit | Attending: Cardiology | Admitting: Cardiology

## 2017-09-28 ENCOUNTER — Encounter (HOSPITAL_COMMUNITY): Payer: Self-pay

## 2017-09-28 VITALS — BP 174/65 | HR 60 | Wt 184.0 lb

## 2017-09-28 DIAGNOSIS — I5041 Acute combined systolic (congestive) and diastolic (congestive) heart failure: Secondary | ICD-10-CM

## 2017-09-28 DIAGNOSIS — I10 Essential (primary) hypertension: Secondary | ICD-10-CM

## 2017-09-28 DIAGNOSIS — N184 Chronic kidney disease, stage 4 (severe): Secondary | ICD-10-CM | POA: Insufficient documentation

## 2017-09-28 DIAGNOSIS — Z87891 Personal history of nicotine dependence: Secondary | ICD-10-CM | POA: Insufficient documentation

## 2017-09-28 DIAGNOSIS — Z960 Presence of urogenital implants: Secondary | ICD-10-CM | POA: Diagnosis not present

## 2017-09-28 DIAGNOSIS — I130 Hypertensive heart and chronic kidney disease with heart failure and stage 1 through stage 4 chronic kidney disease, or unspecified chronic kidney disease: Secondary | ICD-10-CM | POA: Diagnosis not present

## 2017-09-28 DIAGNOSIS — D649 Anemia, unspecified: Secondary | ICD-10-CM | POA: Insufficient documentation

## 2017-09-28 DIAGNOSIS — Z8546 Personal history of malignant neoplasm of prostate: Secondary | ICD-10-CM | POA: Insufficient documentation

## 2017-09-28 DIAGNOSIS — R001 Bradycardia, unspecified: Secondary | ICD-10-CM | POA: Insufficient documentation

## 2017-09-28 DIAGNOSIS — Z79899 Other long term (current) drug therapy: Secondary | ICD-10-CM | POA: Diagnosis not present

## 2017-09-28 DIAGNOSIS — M109 Gout, unspecified: Secondary | ICD-10-CM | POA: Insufficient documentation

## 2017-09-28 DIAGNOSIS — N1330 Unspecified hydronephrosis: Secondary | ICD-10-CM | POA: Diagnosis not present

## 2017-09-28 DIAGNOSIS — I5022 Chronic systolic (congestive) heart failure: Secondary | ICD-10-CM | POA: Diagnosis present

## 2017-09-28 MED ORDER — ISOSORBIDE DINITRATE 40 MG PO TABS: 40.0000 mg | ORAL_TABLET | Freq: Three times a day (TID) | ORAL | 11 refills | Status: DC

## 2017-09-28 MED ORDER — HYDRALAZINE HCL 100 MG PO TABS: 100.0000 mg | ORAL_TABLET | Freq: Three times a day (TID) | ORAL | 11 refills | Status: DC

## 2017-09-28 NOTE — Progress Notes (Signed)
ADVANCED HF CLINIC CONSULT NOTE  Referring Physician: Primary Care: [name] [name]  Primary Cardiologist: None  Nephrology: [name] [name]  Urology: [name] [name]  HPI: [name] [name] is an 83 year old with history of CKD Stage IV, left hydronephrosis with stent placement, HTN, anemia.   Admitted to [facility] 08/16/2017  with increased shortness of breath. Diuresed with IV lasix. Placed on metoprolol but later stopped due to bradycardia. Hospital course was complicated by A/C renal failure. Had left hydronephrosis with stent placed. Creatinine on the day discharge was 6.34. ECHO completed and showed EF 35%. He was not discharged on diuretics. Discharge weight 180 pounds.   Today he returns for HF follow up. Last visit hydralazine was increased to 50 mg three times a day. Overall feeling fine. Denies SOB/PND/Orthopnea. Appetite ok. No fever or chills. Weight at home 180-181 pounds. Taking all medications. Retired lives alone. Able to pay for medications but did mention he has limited income and its hard to pay for.   ECHO 07/2017 EF 35% Grade I DD RA mildly dilated RV normal.     Past Medical History:  Diagnosis Date  . CHF (congestive heart failure) ([place])   . Heart murmur   . History of gout ~ 1998  . Hyperlipidemia   . Hypertension   . Prostate cancer ([place]) ~ 1998   S/P radiation    Current Outpatient Medications  Medication Sig Dispense Refill  . amLODipine (NORVASC) 10 MG tablet TAKE 1 TABLET BY MOUTH EVERY DAY 90 tablet 3  . aspirin 81 MG EC tablet TAKE 1 TABLET BY MOUTH EVERY DAY 90 tablet 3  . brimonidine (ALPHAGAN) 0.2 % ophthalmic solution Place 1 drop into both eyes 2 (two) times daily.  3  . calcitRIOL (ROCALTROL) 0.25 MCG capsule TAKE 1 CAPSULE (0.25 MCG TOTAL) BY MOUTH DAILY. 90 capsule 3  . Cholecalciferol 50000 units TABS Take 50,000 Units by mouth once a week. 8 tablet 0  . diclofenac sodium (VOLTAREN) 1 % GEL Apply 2 g topically 4 (four) times daily. 1 Tube 1  . hydrALAZINE  (APRESOLINE) 50 MG tablet Take 1 tablet (50 mg total) 3 (three) times daily by mouth. 90 tablet 3  . isosorbide dinitrate (ISORDIL) 20 MG tablet Take 1 tablet (20 mg total) by mouth 3 (three) times daily. 90 tablet 3  . latanoprost (XALATAN) 0.005 % ophthalmic solution Place 1 drop into both eyes at bedtime.     . simvastatin (ZOCOR) 10 MG tablet TAKE 1 TABLET (10 MG TOTAL) BY MOUTH DAILY AT 6 PM. 90 tablet 2  . timolol (TIMOPTIC) 0.5 % ophthalmic solution Place 1 drop into both eyes daily.  4   No current facility-administered medications for this encounter.     No Known Allergies    Social History   Socioeconomic History  . Marital status: Married    Spouse name: Not on file  . Number of children: Not on file  . Years of education: Not on file  . Highest education level: Not on file  Social Needs  . Financial resource strain: Not on file  . Food insecurity - worry: Not on file  . Food insecurity - inability: Not on file  . Transportation needs - medical: Not on file  . Transportation needs - non-medical: Not on file  Occupational History  . Not on file  Tobacco Use  . Smoking status: Former Smoker    Packs/day: 1.00    Years: 46.00    Pack years: 46.00  Types: Cigarettes    Last attempt to quit: 1998    Years since quitting: 20.9  . Smokeless tobacco: Never Used  Substance and Sexual Activity  . Alcohol use: No    Alcohol/week: 0.0 oz  . Drug use: No  . Sexual activity: Not on file  Other Topics Concern  . Not on file  Social History Narrative  . Not on file      Family History  Problem Relation Age of Onset  . Hypertension Father   . Hypertension Sister   . Hypertension Brother     Vitals:   09/28/17 0840  BP: (!) 174/65  Pulse: 60  SpO2: 97%  Weight: 184 lb (83.5 kg)    PHYSICAL EXAM: General:  Well appearing. No resp difficulty HEENT: normal Neck: supple. no JVD. Carotids 2+ bilat; no bruits. No lymphadenopathy or thryomegaly  appreciated. Cor: PMI nondisplaced. Regular rate & rhythm. No rubs, gallops or murmurs. Lungs: clear Abdomen: soft, nontender, nondistended. No hepatosplenomegaly. No bruits or masses. Good bowel sounds. Extremities: no cyanosis, clubbing, rash, edema Neuro: alert & orientedx3, cranial nerves grossly intact. moves all 4 extremities w/o difficulty. Affect pleasant     ASSESSMENT & PLAN: 1. Chronic Systolic Heart Failure  -ECHO EF 07/2017 EF 35% RV normal. ? Hypertensive CM -Has not had ischemic work up with due to elevated renal function. Set up lexiscan myoview to assess for ischemic CM. Today we scheduled.  -NYHA II. Functionally doing great.  Volume status stable. Does not require lasix.  - No bb with bradycardia. -No Ace/dig/Spiro with elevated renal function. - Need to control BP.  Increase hydralazine 100 mg three times a day.  - Increase isordil 40 mg three times a day.   2. HTN As above increasing hydralazine and isorodil.   Continue amlodipine 10 mg daily.   3. Left Hypronephrosis S/P stent 07/2017  Has follow up with urology.   4. CKD Stage III Creatinine on discharge 6.3>5. 8  Has follow up with [name] [name].   Today he was provided with Good RX card to see if this will reduce cost of medications.  Follow up in 8 weeks with [name] [name].    [name] Ryle NP-C  8:51 AM

## 2017-09-28 NOTE — Patient Instructions (Signed)
INCREASE Isordil to 40 mg three times daily. Can "double up" on your current 20 mg tablets you have at home (Take 2 tabs three times daily). New paper prescriptions have been supplied to you today for 40 mg tablets (Take 1 tab three times daily).  INCREASE Hydralazine to 100 mg three times daily. Can "double up" on your current 50 mg tablets you have at home (Take 2 tabs three times daily). New paper prescriptions have been supplied to you today for 100 mg tablets (Take 1 tab three times daily).  Will schedule you for a Myoview (exercise stress test) at [facility]. Address: [address] ([address] Floor), [place], [place] [address]  Phone: [phone] Please wear comfortable clothes and shoes for this test. Avoid heavy meal before the test (light snack/meal recommended). Avoid caffeine, alcohol, tobacco products 8 hrs before test. Please give 24 hr notice for cancellations/rescheduling: [phone].  Follow up 8 weeks with Dr. Haroldine Laws.  Take all medication as prescribed the day of your appointment. Bring all medications with you to your appointment.  Do the following things EVERYDAY: 1) Weigh yourself in the morning before breakfast. Write it down and keep it in a log. 2) Take your medicines as prescribed 3) Eat low salt foods-Limit salt (sodium) to 2000 mg per day.  4) Stay as active as you can everyday 5) Limit all fluids for the day to less than 2 liters

## 2017-10-01 ENCOUNTER — Other Ambulatory Visit: Payer: Self-pay

## 2017-10-01 NOTE — Patient Outreach (Addendum)
[place] [facility]) Care Management  10/01/2017  HA PLACERES 1930-05-15 [id number]  EMMI: Stroke (Late entry Incorrect:  Patient is being followed for the EMMI heart failure program) Referral date: 10/01/17 Referral source: EMMI stroke red alert (Incorrect: EMMI heart failure red alert) Referral reason: wgt 182lbs Day # 35   Telephone call to patient regarding EMMI stroke red alert. HIPAA verified with patient. Patient states his weight has been around 181/182. Patient states his weight today is 181 lbs.  Patient denies having any shortness of breath or swelling.   Patient reports seeing his heart doctor on yesterday. {Patient reports his blood pressure was 174/65.  Patient states the doctor increased his  hydralazine to 100 mg per day. Patient reports he is taking his  medication as prescribed.  Patient states his primary MD wants him to have a follow up visit this month. Patient states he has not set up an appointment yet. RNCM advised patient to call his primary MD office today to schedule an appointment.  Patient states he is concerned about taking his norvasc medication. Patient states he heard reports on television that stated they are pulling this medication off of the market due to it causing health issues. RNCM advised patient to discuss with his doctor whether he should discontinue the medication or not. Advised patient to keep taking his medications until having further discussion with the doctor. Patient verbalized understanding.  RNCM informed patient that she would follow up with him to confirm he was able to set up his appointment with his primary doctor. Patient verbally agreed to next telephone outreach  PLAN: RNCM will follow up with patient within 1 week.   Quinn Plowman RN,BSN,CCM [facility] Telephonic  [phone]

## 2017-10-05 ENCOUNTER — Telehealth: Payer: Self-pay | Admitting: Pharmacist

## 2017-10-05 ENCOUNTER — Other Ambulatory Visit: Payer: Self-pay

## 2017-10-05 NOTE — Patient Outreach (Signed)
[place] [facility]) Care Management  10/05/2017  ANDERSEN IORIO May 26, 1930 [id number]   Patient was called per referral about questions with blood pressure medications and the new reports about them causing cancer. Unfortunately, the patient did not answer the phone. The phone rang >30 times without a voicemail.    Plan: Call patient back in 5-7 business days.   Elayne Guerin, PharmD, [place] Clinical Pharmacist [phone]

## 2017-10-05 NOTE — Patient Outreach (Addendum)
[facility]) Care Management  10/05/2017  MACGREGOR AESCHLIMAN 1930-05-03 [id number]  Complete EMMI heart failure program/ Switch to transition of care program. (incorrect: Patient will be switched to hypertension disease management program. Time frame for transition of care program has expired) EMMI: Heart failure program Referral date: 10/01/17 Referral source: EMMI Heart failure red alert Referral reason: wgt 182lbs Day # 35  Telephone follow up for EMMI  Heart failure red alert. HIPAA verified with patient. Patient reports he scheduled a follow up appointment with his primary MD for 10/11/17.  Patient states he continues to take his medication as prescribed but is concerned that two of his medications Hydralazine and Norvasc are to be taken off the market. Patient states, " I have heard they can cause cancer." Patient states he will discuss these medications at his doctor appointment. Patient reports his blood pressure is still around 172/67.  Patient denies any symptoms such as headache or visual disturbance. Patient states his cardiologist office increased his blood pressure medication. Patient states he does not have a return appointment scheduled. Patient states he is scheduled for a Exercise stress test 10/25/17.  Patient states he is unsure if he will do this test or not. Patient reports the last stress test he did was approximately 10 years ago. Patient states, " I'm not sure I want to be walking on that treadmill.  Sometimes you just have to tell these people no, that you won't do all these test."  Patient reports his pulse is 62 and weight is 180 lbs today. Patient states he needs to gain weight. Patient states he is 6 ft 2 in.  Patient states he  Feels he is very slim and needs to gain more weight. RNCM discussed patients weight concerns.  RNCM informed patient she would provide EMMI education material to patient regarding weight management, hypertension and heart  failure.  Patient verbally agreed. RNCM offered continued [facility] care management follow up with patient. Patient verbally agreed to ongoing follow up with telephonic RNCM and telephone outreach from [facility] pharmacist to go over his medications and medication concerns.  Patient agreed to next telephone outreach with patient.   RNCM informed patient she would send him a [facility] care management packet with consent form.  RNCM advised patient to notify MD of any changes in condition prior to scheduled appointment. RNCM provided contact name and number: [phone] or main office number [phone] and 24 hour nurse advise line [phone].  RNCM verified patient aware of 911 services for urgent/ emergent needs.  ASSESSMENT: Patient discharged from hospital 08/25/17.  Per chart review patient was in the hospital for congestive heart failure. Patient has completed EMMI heart failure automated program. Per patient since being discharged from the hospital his blood pressure has been elevated. Patient reports seeing his doctor and having a medication adjustment to his medications. Patient would benefit from ongoing telephonic follow up for hypertension disease management.   PLAN: RNCM will follow up with patient within 1 week.  RNCM will send patient [facility] care management welcome packet/ consent form RNCM will refer patient to [facility] pharmacist. Patient will report at next outreach that he kept primary MD appointment. RNCM will send patients primary MD involvement letter for transition of care follow up.   Quinn Plowman RN,BSN,CCM [facility] Telephonic  [phone]

## 2017-10-08 ENCOUNTER — Other Ambulatory Visit: Payer: Self-pay | Admitting: Pharmacist

## 2017-10-09 ENCOUNTER — Encounter: Payer: Self-pay | Admitting: Pharmacist

## 2017-10-09 NOTE — Patient Outreach (Signed)
[place] [facility]) Care Management  [name] [name]   10/08/17  JARIS KOHLES 1930/04/17 [id number]  Subjective: Patient was called per referral about questions concerning high blood pressure medications and cancer (as has been in the news). HIPAA identifiers were obtained. Patient is an 83 year old male with multiple medical conditions including but not limited to:  Vitamin D, hypertension, AAA, CKD stage III, history of prostate cancer, and vitamin D deficiency.  Patient stated he was concerned about taking amlodipine because he saw in the news that it may cause cancer.  Objective:   Encounter Medications: Outpatient Encounter Medications as of 10/08/2017  Medication Sig  . amLODipine (NORVASC) 10 MG tablet TAKE 1 TABLET BY MOUTH EVERY DAY  . aspirin 81 MG EC tablet TAKE 1 TABLET BY MOUTH EVERY DAY  . brimonidine (ALPHAGAN) 0.2 % ophthalmic solution Place 1 drop into both eyes 2 (two) times daily.  . calcitRIOL (ROCALTROL) 0.25 MCG capsule Take 1 capsule by mouth daily.  . Cholecalciferol 50000 units TABS Take 50,000 Units by mouth once a week.  . hydrALAZINE (APRESOLINE) 100 MG tablet Take 1 tablet (100 mg total) by mouth 3 (three) times daily.  . isosorbide dinitrate (ISORDIL) 40 MG tablet Take 1 tablet (40 mg total) by mouth 3 (three) times daily.  Marland Kitchen latanoprost (XALATAN) 0.005 % ophthalmic solution Place 1 drop into both eyes at bedtime.   . simvastatin (ZOCOR) 10 MG tablet TAKE 1 TABLET (10 MG TOTAL) BY MOUTH DAILY AT 6 PM.  . timolol (TIMOPTIC) 0.5 % ophthalmic solution Place 1 drop into both eyes daily.  . [DISCONTINUED] calcitRIOL (ROCALTROL) 0.25 MCG capsule TAKE 1 CAPSULE (0.25 MCG TOTAL) BY MOUTH DAILY. (Patient not taking: Reported on 10/08/2017)  . [DISCONTINUED] diclofenac sodium (VOLTAREN) 1 % GEL Apply 2 g topically 4 (four) times daily. (Patient not taking: Reported on 10/05/2017)   No facility-administered encounter medications on file as of  10/08/2017.     Functional Status: In your present state of health, do you have any difficulty performing the following activities: 08/27/2017 08/16/2017  Hearing? N N  Vision? N N  Difficulty concentrating or making decisions? N N  Walking or climbing stairs? N Y  Dressing or bathing? N Y  Doing errands, shopping? N N  Some recent data might be hidden    Fall/Depression Screening: Fall Risk  08/27/2017 02/23/2017 12/24/2015  Falls in the past year? No No No  Risk for fall due to : - - -   PHQ 2/9 Scores 08/27/2017 02/23/2017 12/24/2015 12/03/2015 07/30/2015 03/05/2015 02/11/2015  PHQ - 2 Score 0 0 0 0 0 0 0      Assessment: Patient's medications were reviewed while speaking to him on the phone.   Drugs sorted by system:  Cardiovascular: Amlodipine Aspirin Hydralazine Isosorbide Dinitrate Simvastatin  Renal: Calcitriol  Ophthalmic Birmodine tartrate Latanoprost Timolol  Vitamins/Minerals: Cholecalciferol  Education Provided The following excerpt from the FDA concerning amlodipine recall was discussed with the patient. Actually, amlodipine/valsartan and amlodipine/valsartan/HCTZ were the products involved--and only those made by [employer].   "[employer] has initiated a voluntary recall in the [place], to the patient level, of all lots of Amlodipine / Valsartan combination tablets and Amlodipine / Valsartan / Hydrochlorothiazide combination tablets (see table below) due to an impurity detected above specification limits in an active pharmaceutical ingredient (API) manufactured by [employer] [place]. The impurity found in [employer]'s valsartan API is known as N-nitroso-diethylamine (NDEA), which has been classified as a probable human  carcinogen. This chemical is typically found in very small amounts in certain foods, drinking water, air pollution, and certain industrial processes. Amlodipine/Valsartan combination tablets and Amlodipine/Valsartan/Hydrochlorothiazide combination  tablets are used for the treatment of high blood pressure. To date, [employer] has not received any reports of adverse events signaling a potential link or exposure to valsartan."  [employer]no  When products are recalled, the companies publish the lots involved. Often, pharmacies will reach out to patients when they are aware their supply included affected lots. That being said, patients are always encouraged to advocate for themselves by calling their providers or pharmacists if they are concerned their medication may be involved or if they have questions.  Plan:  Patient's case will be closed as he did not have any more questions about his medications and was happy to know his amlodipine was not involved in the recall. A note will be sent to [facility] to alert of pharmacy case closure.  Elayne Guerin, PharmD, [place] Clinical Pharmacist [phone]

## 2017-10-11 ENCOUNTER — Other Ambulatory Visit: Payer: Self-pay

## 2017-10-11 ENCOUNTER — Encounter: Payer: Self-pay | Admitting: Internal Medicine

## 2017-10-11 ENCOUNTER — Ambulatory Visit (INDEPENDENT_AMBULATORY_CARE_PROVIDER_SITE_OTHER): Payer: Medicare Other | Admitting: Internal Medicine

## 2017-10-11 VITALS — BP 147/76 | HR 52 | Temp 97.4°F | Ht 73.0 in | Wt 186.7 lb

## 2017-10-11 DIAGNOSIS — E785 Hyperlipidemia, unspecified: Secondary | ICD-10-CM | POA: Diagnosis not present

## 2017-10-11 DIAGNOSIS — I5041 Acute combined systolic (congestive) and diastolic (congestive) heart failure: Secondary | ICD-10-CM | POA: Diagnosis not present

## 2017-10-11 DIAGNOSIS — N184 Chronic kidney disease, stage 4 (severe): Secondary | ICD-10-CM

## 2017-10-11 DIAGNOSIS — Z79899 Other long term (current) drug therapy: Secondary | ICD-10-CM

## 2017-10-11 DIAGNOSIS — I130 Hypertensive heart and chronic kidney disease with heart failure and stage 1 through stage 4 chronic kidney disease, or unspecified chronic kidney disease: Secondary | ICD-10-CM

## 2017-10-11 DIAGNOSIS — Z87891 Personal history of nicotine dependence: Secondary | ICD-10-CM

## 2017-10-11 DIAGNOSIS — I10 Essential (primary) hypertension: Secondary | ICD-10-CM

## 2017-10-11 MED ORDER — SIMVASTATIN 10 MG PO TABS: ORAL_TABLET | ORAL | 2 refills | Status: DC

## 2017-10-11 NOTE — Assessment & Plan Note (Addendum)
To manage fluids, patient limits intake of salt and fluid in diet. No current symptoms of SOB with exertion, leg swelling, weight gain, or orthopnea. Patient states he is able to climb flight of stairs without difficulty. His physical exam is reassuring and does not reveal signs of volume overload. Patient weighs himself daily and has not had any significant recent weight gain.   Plan: -Continue current diet modification, including reduced salt intake and fluid intake -Follow up with HF clinic on 11/25/2016 as scheduled

## 2017-10-11 NOTE — Patient Outreach (Signed)
[place] [facility]) Care Management  10/11/2017  [name] [name] [birth year] [id number]  EMMI: EMMI heart failure  Referral date: 10/11/17 Referral source: EMMI heart failure red alert Referral reason: filled new prescriptions: NO Day # 43 Attempt #1  Telephone call to patient regarding EMMI heart failure red alert. Unable to reach patient. HIPAA compliant voice message left with call back phone number.   PLAN; RNCM will attempt 2nd telephone outreach to patient within 3 business days.   Quinn Plowman RN,BSN,CCM [facility] Telephonic  [phone]

## 2017-10-11 NOTE — Assessment & Plan Note (Signed)
BP elevated to 153/76, with repeat of 147/76. Patient currently on amlodipine 10 mg, hydralazine 100 mg TID and isosorbide dinitrate 40 mg TID. He takes this regimen daily and without difficulty. Patient has been doing well on this regimen, as BP closer to goal today. He would have continued benefit from afterload reduction, however patient unable to take BB 2/2 bradycardia (HR = 55 today) and is not currently taking ACE/ARB secondary to chronic kidney disease, stage 4. Could consider adding clonidine, but the benefits of this medication do not outweigh the risks of adding another drug to the patient's regimen at this time since his BP is already close to goal. Will plan to continue current regimen and monitor BP at follow up visit with PCP in about 8 weeks. Could consider adding additional agent such as clonidine at that time if BP significantly elevated above goal.  Plan: -Continue amlodipine 10 mg, hydralazine 100 mg TID and isosorbide dinitrate 40 mg TID -RTC in about 8 weeks for management of chronic medical conditions

## 2017-10-11 NOTE — Progress Notes (Signed)
   CC: HTN follow up  HPI:  [name] is a 83 y.o. with PMH of HTN, HLD, CKD stage IV, and HFrEF (echo 07/2017 with EF 35%) who presents for management of multiple chronic conditions. Since his last clinic visit on 08/27/2017, the patient has been seen in the heart failure clinic by Dr. Haroldine [name]. At that visit the patient the patient's BP was elevated and his hydralazine was increased to 100 mg TID and isosorbide dinitrate was also increased to 40 mg TID.  Since his last visit the patient has felt well. He denies any difficulties taking his medications and following up with his specialty physicians, including cardiologist and nephrologist. He was hospitalized in 07/2017 for shortness of breath and states that since leaving the hospital his symptoms have drastically improved. He denies any current difficulties breathing at rest and with exertion, orthopnea (sleeps regularly on 2 pillows at night), and lower extremity/abdominal swelling.   Past Medical History: Past Medical History:  Diagnosis Date  . CHF (congestive heart failure) ([place])   . Heart murmur   . History of gout ~ 1998  . Hyperlipidemia   . Hypertension   . Prostate cancer ([place]) ~ 1998   S/P radiation   Review of Systems:   Patient denies fevers, headaches, chest pain, abdominal pain, diaphoresis, nausea/vomiting, and change in bowel/bladder habits.  Physical Exam:  Vitals:   10/11/17 0857 10/11/17 0928  BP: (!) 153/76 (!) 147/76  Pulse: (!) 55 (!) 52  Temp: (!) 97.4 F (36.3 C)   TempSrc: Oral   SpO2: 99%   Weight: 186 lb 11.2 oz (84.7 kg)   Height: 6\' 1"  (1.854 m)     Physical Exam  Constitutional: He appears well-developed and well-nourished. No distress.  Cardiovascular: Normal rate and regular rhythm.  No murmur heard. 2+ radial pulses bilaterally  Respiratory:  Speaking in full sentences. No accessory muscle use or nasal flaring. No wheezing or crackles appreciated in all lung fields.     Musculoskeletal: He exhibits no edema (of bilateral lower extremities) or tenderness (of bilateral lower extremities).  Skin: Skin is warm and dry. No rash noted. He is not diaphoretic. No erythema.   Assessment & Plan:   See Encounters Tab for problem based charting.  Patient seen with Dr. Eppie [name]

## 2017-10-11 NOTE — Patient Instructions (Signed)
Thank you for seeing [place] in the clinic today!  You were evaluated for high blood pressure. Please continue taking your medications as prescribed. Please take amlodipine 10 mg daily, isosorbide dinitrate 40 mg three times a day and hydralazine 100 mg three times a day. Please follow up with your heart doctor in January, as previously scheduled. Please also continue to follow up with your kidney doctor as discussed with them during your last visit.  Please return to the clinic in 2-3 months for follow up of your chronic medical conditions with your primary care provider.  If you have any questions or concerns, please call our clinic at [phone] between the hours of 9am-5pm. If you have a problem after these hours, please call [phone] and ask for the internal medicine resident on call. If you feel you are having a medical emergency please call 911.   Thanks, Dr. Larena Glassman Kamal Jurgens

## 2017-10-11 NOTE — Assessment & Plan Note (Addendum)
Patient states he is running low on HLD medication, simvastatin 10 mg daily. Last LDL on 06/2014 = 76. Will refill medication and defer to PCP regarding management/further LDL testing.  Plan: -Refill simvastatin 10 mg daily -RTC in 8 weeks, consider LDL testing if indicated

## 2017-10-12 ENCOUNTER — Ambulatory Visit: Payer: Self-pay

## 2017-10-13 ENCOUNTER — Other Ambulatory Visit: Payer: Self-pay

## 2017-10-13 NOTE — Patient Outreach (Addendum)
[place] [facility]) Care Management  10/13/2017  TERRYN REDNER 1930/09/26 [id number]  EMMI:EMMI heart failure  Referral date:10/11/17 Referral source:EMMI heart failure red alert Referral reason:filled new prescriptions: NO Day #43  Telephone call to patient regarding EMMI heart failure. HIPAA verified with patient. Patient states he is not having any issues with his medications. Patient states he spoke with [facility] pharmacist, Denyse Amass regarding his medication. Patient states he feels much better knowing  That his medication is not being recalled.  Patient states he does not feel he needs any additional calls and request EMMI heart failure automated calls to be discontinued.  Patient states he is doing fine and is not having any new symptoms.   PLAN; RNCM  Will refer patient to care management assistant to close due to patient being assessed and having no further needs. Patient refusing ongoing [facility] care management services.  Care management assistant notified to discontinue EMMI automated calls at patients request.   Quinn Plowman RN,BSN,CCM [facility] Telephonic  [phone]

## 2017-10-14 ENCOUNTER — Other Ambulatory Visit (HOSPITAL_COMMUNITY): Payer: Self-pay | Admitting: *Deleted

## 2017-10-14 DIAGNOSIS — I10 Essential (primary) hypertension: Secondary | ICD-10-CM

## 2017-10-14 DIAGNOSIS — I5041 Acute combined systolic (congestive) and diastolic (congestive) heart failure: Secondary | ICD-10-CM

## 2017-10-14 MED ORDER — ISOSORBIDE DINITRATE 40 MG PO TABS: 40.0000 mg | ORAL_TABLET | Freq: Three times a day (TID) | ORAL | 11 refills | Status: DC

## 2017-10-15 NOTE — Progress Notes (Signed)
I saw and evaluated the patient.  I personally confirmed the key portions of Dr. Gevena Barre history and exam and reviewed pertinent patient test results.  The assessment, diagnosis, and plan were formulated together and I agree with the documentation in the resident's note.

## 2017-10-18 ENCOUNTER — Telehealth (HOSPITAL_COMMUNITY): Payer: Self-pay | Admitting: Pharmacist

## 2017-10-18 NOTE — Telephone Encounter (Signed)
Isordil PA approved by [employer] through 10/18/18.   Ruta Hinds. Velva Harman, PharmD, BCPS, CPP Clinical Pharmacist Pager: [phone] Phone: [phone] 10/18/2017 11:12 AM

## 2017-11-03 ENCOUNTER — Telehealth (HOSPITAL_COMMUNITY): Payer: Self-pay | Admitting: Vascular Surgery

## 2017-11-03 NOTE — Telephone Encounter (Signed)
Spoke to pt to reschedule appt w/ DB due to DB not being in office on 1/31 rescheduled pt for 2/6 @ 10:40am , pt voiced that was ok to reschedule this appt to this day

## 2017-11-04 ENCOUNTER — Emergency Department (HOSPITAL_COMMUNITY): Payer: Medicare Other

## 2017-11-04 ENCOUNTER — Emergency Department (HOSPITAL_COMMUNITY)
Admission: EM | Admit: 2017-11-04 | Discharge: 2017-11-04 | Disposition: A | Discharge status: Home / Self Care | Payer: Medicare Other | Attending: Emergency Medicine | Admitting: Emergency Medicine

## 2017-11-04 ENCOUNTER — Other Ambulatory Visit: Payer: Self-pay

## 2017-11-04 ENCOUNTER — Encounter (HOSPITAL_COMMUNITY): Payer: Self-pay | Admitting: Emergency Medicine

## 2017-11-04 DIAGNOSIS — I130 Hypertensive heart and chronic kidney disease with heart failure and stage 1 through stage 4 chronic kidney disease, or unspecified chronic kidney disease: Secondary | ICD-10-CM | POA: Insufficient documentation

## 2017-11-04 DIAGNOSIS — Z87891 Personal history of nicotine dependence: Secondary | ICD-10-CM | POA: Insufficient documentation

## 2017-11-04 DIAGNOSIS — Z7982 Long term (current) use of aspirin: Secondary | ICD-10-CM | POA: Insufficient documentation

## 2017-11-04 DIAGNOSIS — Z8546 Personal history of malignant neoplasm of prostate: Secondary | ICD-10-CM | POA: Insufficient documentation

## 2017-11-04 DIAGNOSIS — I509 Heart failure, unspecified: Secondary | ICD-10-CM

## 2017-11-04 DIAGNOSIS — R0602 Shortness of breath: Secondary | ICD-10-CM | POA: Diagnosis present

## 2017-11-04 DIAGNOSIS — I5043 Acute on chronic combined systolic (congestive) and diastolic (congestive) heart failure: Secondary | ICD-10-CM | POA: Insufficient documentation

## 2017-11-04 DIAGNOSIS — N183 Chronic kidney disease, stage 3 (moderate): Secondary | ICD-10-CM | POA: Diagnosis not present

## 2017-11-04 DIAGNOSIS — Z79899 Other long term (current) drug therapy: Secondary | ICD-10-CM | POA: Insufficient documentation

## 2017-11-04 LAB — I-STAT TROPONIN, ED: Troponin i, poc: 0.02 ng/mL (ref 0.00–0.08)

## 2017-11-04 LAB — CBC
HEMATOCRIT: 33.8 % — AB (ref 39.0–52.0)
Hemoglobin: 11.1 g/dL — ABNORMAL LOW (ref 13.0–17.0)
MCH: 30.8 pg (ref 26.0–34.0)
MCHC: 32.8 g/dL (ref 30.0–36.0)
MCV: 93.9 fL (ref 78.0–100.0)
Platelets: 219 10*3/uL (ref 150–400)
RBC: 3.6 MIL/uL — AB (ref 4.22–5.81)
RDW: 11.5 % (ref 11.5–15.5)
WBC: 7.7 10*3/uL (ref 4.0–10.5)

## 2017-11-04 LAB — BRAIN NATRIURETIC PEPTIDE: B NATRIURETIC PEPTIDE 5: 1393.6 pg/mL — AB (ref 0.0–100.0)

## 2017-11-04 LAB — BASIC METABOLIC PANEL
Anion gap: 12 (ref 5–15)
BUN: 39 mg/dL — ABNORMAL HIGH (ref 6–20)
CHLORIDE: 101 mmol/L (ref 101–111)
CO2: 22 mmol/L (ref 22–32)
Calcium: 9.6 mg/dL (ref 8.9–10.3)
Creatinine, Ser: 3.45 mg/dL — ABNORMAL HIGH (ref 0.61–1.24)
GFR calc non Af Amer: 15 mL/min — ABNORMAL LOW (ref >60)
GFR, EST AFRICAN AMERICAN: 17 mL/min — AB (ref >60)
Glucose, Bld: 119 mg/dL — ABNORMAL HIGH (ref 65–99)
POTASSIUM: 3.5 mmol/L (ref 3.5–5.1)
SODIUM: 135 mmol/L (ref 135–145)

## 2017-11-04 MED ORDER — FUROSEMIDE 10 MG/ML IJ SOLN: 20.0000 mg | Freq: Once | INTRAMUSCULAR | Status: DC

## 2017-11-04 MED ORDER — FUROSEMIDE 10 MG/ML IJ SOLN
20.0000 mg | Freq: Once | INTRAMUSCULAR | Status: AC
  Administered 2017-11-04: 20 mg via INTRAVENOUS
  Filled 2017-11-04: qty 2

## 2017-11-04 NOTE — ED Provider Notes (Signed)
[facility] EMERGENCY DEPARTMENT Provider Note   CSN: [id number] Arrival date & time: 11/04/17  2706     History   Chief Complaint Chief Complaint  Patient presents with  . Shortness of Breath    HPI [name] [name] is a 84 y.o. male.  The history is provided by the patient.  Shortness of Breath  This is a recurrent problem. The current episode started 3 to 5 hours ago. The problem has been gradually improving. Associated symptoms include orthopnea. Pertinent negatives include no fever, no headaches, no rhinorrhea, no sore throat, no ear pain, no cough, no sputum production, no hemoptysis, no wheezing, no chest pain, no vomiting, no abdominal pain, no rash and no leg swelling. He has tried nothing for the symptoms. He has had prior hospitalizations. He has had prior ED visits. Associated medical issues include heart failure.    84 year old male with history of stage III CKD congestive heart failure here with acute onset of worsening shortness of breath since 2 AM this morning that woke him up.  States he feels improved sitting up.  Currently he is on nasal cannula oxygen and he still is complaining of shortness of breath looks very comfortable.  He denies any chest pain abdominal pain cough fever.  He states his been taking his medications regularly.  He denies any sick contacts.  His last known EF was 30% on echo in October.  Past Medical History:  Diagnosis Date  . CHF (congestive heart failure) ([place])   . Heart murmur   . History of gout ~ 1998  . Hyperlipidemia   . Hypertension   . Prostate cancer ([place]) ~ 1998   S/P radiation    Patient Active Problem List   Diagnosis Date Noted  . Ectatic abdominal aorta ([place]) 08/25/2017  . Abdominal aortic atherosclerosis ([place]) 08/25/2017  . Hydronephrosis of left kidney 08/25/2017  . Acute renal failure ([place]) 08/25/2017  . Secondary hyperparathyroidism of renal origin ([place]) 08/25/2017  . Bradycardia, drug  induced 08/20/2017  . Acute combined systolic and diastolic congestive heart failure ([place]) 08/17/2017  . Acute respiratory failure with hypoxia ([place]) 08/16/2017  . Weight loss 02/24/2017  . Vitamin D deficiency 02/24/2017  . Advanced care planning/counseling discussion 02/24/2017  . CKD (chronic kidney disease), stage III ([place]) 04/14/2013  . Essential hypertension 10/27/2012  . Hyperlipidemia 10/27/2012  . History of prostate cancer 10/27/2012  . Preventative health care 10/27/2012    Past Surgical History:  Procedure Laterality Date  . CATARACT EXTRACTION W/ INTRAOCULAR LENS  IMPLANT, BILATERAL Bilateral   . CYSTOSCOPY W/ URETERAL STENT PLACEMENT Left 08/21/2017   Procedure: CYSTOSCOPY WITH RETROGRADE PYELOGRAM/URETERAL JJ STENT PLACEMENT;  Surgeon: Ceasar Mons, MD;  Location: [facility];  Service: Urology;  Laterality: Left;       Home Medications    Prior to Admission medications   Medication Sig Start Date End Date Taking? Authorizing Provider  amLODipine (NORVASC) 10 MG tablet TAKE 1 TABLET BY MOUTH EVERY DAY 09/22/17   Tawny Asal, MD  aspirin 81 MG EC tablet TAKE 1 TABLET BY MOUTH EVERY DAY 09/22/17   Tawny Asal, MD  brimonidine (ALPHAGAN) 0.2 % ophthalmic solution Place 1 drop into both eyes 2 (two) times daily. 07/07/17   [provider]  calcitRIOL (ROCALTROL) 0.25 MCG capsule Take 1 capsule by mouth daily. 09/25/17   [provider]  hydrALAZINE (APRESOLINE) 100 MG tablet Take 1 tablet (100 mg total) by mouth 3 (three) times daily. 09/28/17 01/26/18  Clegg, Amy D, NP  isosorbide dinitrate (ISORDIL) 40 MG tablet Take 1 tablet (40 mg total) by mouth 3 (three) times daily. 10/14/17 02/11/18  Clegg, Amy D, NP  latanoprost (XALATAN) 0.005 % ophthalmic solution Place 1 drop into both eyes at bedtime.     [provider]  simvastatin (ZOCOR) 10 MG tablet TAKE 1 TABLET (10 MG TOTAL) BY MOUTH DAILY AT 6 PM. 10/11/17   Nedrud, Larena Glassman, MD    timolol (TIMOPTIC) 0.5 % ophthalmic solution Place 1 drop into both eyes daily. 06/02/17   [provider]    Family History Family History  Problem Relation Age of Onset  . Hypertension Father   . Hypertension Sister   . Hypertension Brother     Social History Social History   Tobacco Use  . Smoking status: Former Smoker    Packs/day: 1.00    Years: 46.00    Pack years: 46.00    Types: Cigarettes    Last attempt to quit: 1998    Years since quitting: 21.0  . Smokeless tobacco: Never Used  Substance Use Topics  . Alcohol use: No    Alcohol/week: 0.0 oz  . Drug use: No     Allergies   Patient has no known allergies.   Review of Systems Review of Systems  Constitutional: Negative for chills and fever.  HENT: Negative for ear pain, rhinorrhea and sore throat.   Eyes: Negative for pain and visual disturbance.  Respiratory: Positive for shortness of breath. Negative for cough, hemoptysis, sputum production and wheezing.   Cardiovascular: Positive for orthopnea. Negative for chest pain, palpitations and leg swelling.  Gastrointestinal: Negative for abdominal pain and vomiting.  Genitourinary: Negative for dysuria and hematuria.  Musculoskeletal: Negative for arthralgias and back pain.  Skin: Negative for color change and rash.  Neurological: Negative for seizures, syncope and headaches.  All other systems reviewed and are negative.    Physical Exam Updated Vital Signs BP (!) 189/101 (BP Location: Right Arm)   Pulse 76   Temp (!) 97.2 F (36.2 C) (Oral)   Resp 19   Ht 5\' 11"  (1.803 m)   Wt 84.4 kg (186 lb)   SpO2 99%   BMI 25.94 kg/m   Physical Exam  Constitutional: He appears well-developed and well-nourished. No distress.  HENT:  Head: Normocephalic and atraumatic.  Mouth/Throat: Oropharynx is clear and moist.  Eyes: Conjunctivae are normal.  Neck: Neck supple.  Cardiovascular: Normal rate and regular rhythm.  No murmur  heard. Pulmonary/Chest: Effort normal. No respiratory distress. He has rales in the right lower field and the left lower field. He exhibits no edema.  Abdominal: Soft. There is no tenderness.  Musculoskeletal: Normal range of motion. He exhibits no edema.       Right lower leg: He exhibits no edema.       Left lower leg: He exhibits no edema.  Neurological: He is alert.  Skin: Skin is warm and dry.  Psychiatric: He has a normal mood and affect.  Nursing note and vitals reviewed.    ED Treatments / Results  Labs (all labs ordered are listed, but only abnormal results are displayed) Labs Reviewed  CBC - Abnormal; Notable for the following components:      Result Value   RBC 3.60 (*)    Hemoglobin 11.1 (*)    HCT 33.8 (*)    All other components within normal limits  BASIC METABOLIC PANEL  BRAIN NATRIURETIC PEPTIDE  I-STAT TROPONIN, ED  EKG  EKG Interpretation  Date/Time:  Thursday November 04 2017 06:31:17 EST Ventricular Rate:  83 PR Interval:  226 QRS Duration: 104 QT Interval:  416 QTC Calculation: 488 R Axis:   76 Text Interpretation:  Sinus rhythm with 1st degree A-V block with occasional Premature ventricular complexes Left ventricular hypertrophy with repolarization abnormality Cannot rule out Septal infarct , age undetermined Confirmed by Dory Horn) on 11/04/2017 6:35:50 AM       Radiology Dg Chest 2 View  Result Date: 11/04/2017 CLINICAL DATA:  84 year old male with history of CHF presents with shortness of breath. EXAM: CHEST  2 VIEW COMPARISON:  Chest radiograph dated 08/16/2017 FINDINGS: Top-normal cardiac size. Diffuse interstitial and vascular prominence most consistent with mild vascular congestion. Right mid to lower lung field streaky densities, likely represent vascular prominence and areas of interstitial scarring. Pneumonia is not excluded. Clinical correlation is recommended. There is no focal consolidation. Probable small bilateral pleural  effusions. No pneumothorax the thoracic aorta is mildly tortuous. No acute osseous pathology. IMPRESSION: Borderline cardiomegaly with mild vascular congestion and probable small bilateral pleural effusions. Pneumonia is not excluded. Clinical correlation is recommended. Electronically Signed   By: Anner Crete M.D.   On: 11/04/2017 06:54    Procedures Procedures (including critical care time)  Medications Ordered in ED Medications - No data to display   Initial Impression / Assessment and Plan / ED Course  I have reviewed the triage vital signs and the nursing notes.  Pertinent labs & imaging results that were available during my care of the patient were reviewed by me and considered in my medical decision making (see chart for details). I have reviewed the patients recent medical records and/or prior ED visits.   Clinical Course as of Nov 06 817  Darcel Bayley  9678 ECG-sinus rhythm with first-degree AV block LVH with repolarization similar to prior EKG November 2018.  [MB]  1001 I have reviewed the patients recent medical records and/or prior ED visits.    [MB]  9381 Patient diuresed well.  Now off oxygen and satting in the low to mid 90s at rest.  We did do a trending pulse ox which he did well with and he feels back to baseline.  He is comfortable being discharged I think it is reasonable.  He understands he needs to follow-up with his primary care doctor and cardiologist and return if any worsening symptoms.  [MB]    Clinical Course User Index [MB] Hayden Rasmussen, MD    84 year old male with history of congestive heart failure and CKD here with worsening dyspnea.  Differential diagnosis is pneumonia CHF anemia worsening CKD.  Based on his labs chest x-ray EKG this seems to be primarily worsening CHF.  His renal function is otherwise improved.  We will diurese him here as he looks very comfortable on nasal cannula laying down in bed.  if we can diurese him and get him off  oxygen but could trend him to see if he would be appropriate for discharge.  Final Clinical Impressions(s) / ED Diagnoses   Final diagnoses:  Shortness of breath  Congestive heart failure, unspecified HF chronicity, unspecified heart failure type [facility])    ED Discharge Orders    None       Hayden Rasmussen, MD 11/06/17 [phone]

## 2017-11-04 NOTE — Discharge Instructions (Signed)
You were seen in the emergency department for shortness of breath.  Your x-ray and lab work show that you are retaining some extra fluid.  You were given something to help you urinate of the extra fluid.  You need to continue taking her regular medicines and continue a low-salt diet.  Please contact your primary care doctor to schedule a follow-up next week.  You should return to the emergency department for any worsening symptoms.

## 2017-11-04 NOTE — ED Triage Notes (Signed)
Pt presents with SOB that began 0230 this morning; DOE noted; decreased lung sounds in lower lobes

## 2017-11-04 NOTE — ED Notes (Signed)
Pt's Oxygen sat maintained at 96% while ambulating.

## 2017-11-25 ENCOUNTER — Encounter (HOSPITAL_COMMUNITY): Payer: Medicare Other | Admitting: Internal Medicine

## 2017-12-01 ENCOUNTER — Ambulatory Visit (HOSPITAL_COMMUNITY)
Admission: RE | Admit: 2017-12-01 | Discharge: 2017-12-01 | Disposition: A | Discharge status: Home / Self Care | Payer: Medicare Other | Source: Ambulatory Visit | Attending: Internal Medicine | Admitting: Internal Medicine

## 2017-12-01 VITALS — BP 172/69 | HR 68 | Wt 174.8 lb

## 2017-12-01 DIAGNOSIS — Z8546 Personal history of malignant neoplasm of prostate: Secondary | ICD-10-CM | POA: Insufficient documentation

## 2017-12-01 DIAGNOSIS — I130 Hypertensive heart and chronic kidney disease with heart failure and stage 1 through stage 4 chronic kidney disease, or unspecified chronic kidney disease: Secondary | ICD-10-CM | POA: Insufficient documentation

## 2017-12-01 DIAGNOSIS — I5022 Chronic systolic (congestive) heart failure: Secondary | ICD-10-CM | POA: Diagnosis present

## 2017-12-01 DIAGNOSIS — Z79899 Other long term (current) drug therapy: Secondary | ICD-10-CM | POA: Insufficient documentation

## 2017-12-01 DIAGNOSIS — D649 Anemia, unspecified: Secondary | ICD-10-CM | POA: Diagnosis not present

## 2017-12-01 DIAGNOSIS — I10 Essential (primary) hypertension: Secondary | ICD-10-CM

## 2017-12-01 DIAGNOSIS — E785 Hyperlipidemia, unspecified: Secondary | ICD-10-CM | POA: Diagnosis not present

## 2017-12-01 DIAGNOSIS — N184 Chronic kidney disease, stage 4 (severe): Secondary | ICD-10-CM

## 2017-12-01 DIAGNOSIS — Z7982 Long term (current) use of aspirin: Secondary | ICD-10-CM | POA: Diagnosis not present

## 2017-12-01 DIAGNOSIS — Z87891 Personal history of nicotine dependence: Secondary | ICD-10-CM | POA: Insufficient documentation

## 2017-12-01 LAB — BASIC METABOLIC PANEL
ANION GAP: 12 (ref 5–15)
BUN: 55 mg/dL — ABNORMAL HIGH (ref 6–20)
CALCIUM: 9.4 mg/dL (ref 8.9–10.3)
CO2: 21 mmol/L — ABNORMAL LOW (ref 22–32)
Chloride: 103 mmol/L (ref 101–111)
Creatinine, Ser: 4.12 mg/dL — ABNORMAL HIGH (ref 0.61–1.24)
GFR, EST AFRICAN AMERICAN: 14 mL/min — AB (ref >60)
GFR, EST NON AFRICAN AMERICAN: 12 mL/min — AB (ref >60)
Glucose, Bld: 101 mg/dL — ABNORMAL HIGH (ref 65–99)
POTASSIUM: 3.7 mmol/L (ref 3.5–5.1)
SODIUM: 136 mmol/L (ref 135–145)

## 2017-12-01 MED ORDER — DOXAZOSIN MESYLATE 2 MG PO TABS: 2.0000 mg | ORAL_TABLET | Freq: Every day | ORAL | 6 refills | Status: DC

## 2017-12-01 NOTE — Patient Instructions (Signed)
Start Doxazosin 2 mg daily  Lab today  We will contact you in 6 months to schedule your next appointment and echocardiogram

## 2017-12-01 NOTE — Progress Notes (Signed)
ADVANCED HF CLINIC CONSULT NOTE  Referring Physician: Primary Care: Dr Johny Chess  Primary Cardiologist: None  Nephrology: Dr Hollie Salk  Urology: Dr Lovena Neighbours  HPI: Mr Mula is an 84 year old with history of systolic HF (EF 99%) CKD Stage IV, left hydronephrosis with stent placement, HTN, anemia.   Admitted to [facility] 08/16/2017  with increased shortness of breath. Diuresed with IV lasix. Placed on metoprolol but later stopped due to bradycardia. Hospital course was complicated by A/C renal failure. Had left hydronephrosis with stent placed. Creatinine on the day discharge was 6.34. ECHO completed and showed EF 35% (thjis was new). No cath due to renal failure. He was not discharged on diuretics. Discharge weight 180 pounds.   Says he feels really good. Able to do all ADLs. Denies any further "spells" of SOB. Taking meds as prescribed. No orthopnea or PND. Says kidneys are getting "better and better". SBP remains 160-170. Denies CP, orthopnea or PND.   At last visit [place] schedule but he cancelled.  ECHO 07/2017 EF 35% Grade I DD RA mildly dilated RV normal.     Past Medical History:  Diagnosis Date  . CHF (congestive heart failure) ([place])   . Heart murmur   . History of gout ~ 1998  . Hyperlipidemia   . Hypertension   . Prostate cancer ([place]) ~ 1998   S/P radiation    Current Outpatient Medications  Medication Sig Dispense Refill  . amLODipine (NORVASC) 10 MG tablet TAKE 1 TABLET BY MOUTH EVERY DAY 90 tablet 3  . aspirin 81 MG EC tablet TAKE 1 TABLET BY MOUTH EVERY DAY 90 tablet 3  . brimonidine (ALPHAGAN) 0.2 % ophthalmic solution Place 1 drop into both eyes 2 (two) times daily.  3  . calcitRIOL (ROCALTROL) 0.25 MCG capsule Take 1 capsule by mouth daily.  3  . hydrALAZINE (APRESOLINE) 100 MG tablet Take 1 tablet (100 mg total) by mouth 3 (three) times daily. 90 tablet 11  . isosorbide dinitrate (ISORDIL) 40 MG tablet Take 1 tablet (40 mg total) by mouth 3 (three) times  daily. 90 tablet 11  . latanoprost (XALATAN) 0.005 % ophthalmic solution Place 1 drop into both eyes at bedtime.     . simvastatin (ZOCOR) 10 MG tablet TAKE 1 TABLET (10 MG TOTAL) BY MOUTH DAILY AT 6 PM. 90 tablet 2  . timolol (TIMOPTIC) 0.5 % ophthalmic solution Place 1 drop into both eyes daily.  4   No current facility-administered medications for this encounter.     No Known Allergies    Social History   Socioeconomic History  . Marital status: Married    Spouse name: Not on file  . Number of children: Not on file  . Years of education: Not on file  . Highest education level: Not on file  Social Needs  . Financial resource strain: Not on file  . Food insecurity - worry: Not on file  . Food insecurity - inability: Not on file  . Transportation needs - medical: Not on file  . Transportation needs - non-medical: Not on file  Occupational History  . Not on file  Tobacco Use  . Smoking status: Former Smoker    Packs/day: 1.00    Years: 46.00    Pack years: 46.00    Types: Cigarettes    Last attempt to quit: 1998    Years since quitting: 21.1  . Smokeless tobacco: Never Used  Substance and Sexual Activity  . Alcohol use: No  Alcohol/week: 0.0 oz  . Drug use: No  . Sexual activity: Not on file  Other Topics Concern  . Not on file  Social History Narrative  . Not on file      Family History  Problem Relation Age of Onset  . Hypertension Father   . Hypertension Sister   . Hypertension Brother     Vitals:   12/01/17 1038  BP: (!) 172/69  Pulse: 68  SpO2: 94%  Weight: 174 lb 12.8 oz (79.3 kg)    PHYSICAL EXAM: General:  Elderly looks younger than stated age  No resp difficulty HEENT: normal Neck: supple. no JVD. Carotids 2+ bilat; no bruits. No lymphadenopathy or thryomegaly appreciated. Cor: PMI nondisplaced. Regular rate & rhythm. No rubs, gallops or murmurs. Lungs: clear Abdomen: soft, nontender, nondistended. No hepatosplenomegaly. No bruits or  masses. Good bowel sounds. Extremities: no cyanosis, clubbing, rash, edema Neuro: alert & orientedx3, cranial nerves grossly intact. moves all 4 extremities w/o difficulty. Affect pleasant     ASSESSMENT & PLAN: 1. Chronic Systolic Heart Failure  -ECHO EF 07/2017 EF 35% RV normal. ? Hypertensive CM - Bedside echo today EF 40-45% -Has not had ischemic work up with due to elevated renal function. At last visit, we set up [place] to assess for ischemic CM but he cancelled. Says he doesn't want to do it now. With advanced age and lack of ischemic symptoms I am ok with this plan,  - Stable NYHA II.  Volume status stable. Does not require lasix.  - No bb with bradycardia. - No Ace/dig/Spiro with elevated renal function. - Need to control BP.  Continue hydralazine 100 mg three times a day and isordil 40 TID  - Continue amlodipine 10  - Will add doxazosin 2mg  daily - RTC in 6 months with echo  2. HTN - remains poorly controlled - add doxazosin 2mg    3. Left Hypronephrosis S/P stent 07/2017  - stent has been removed  4. CKD Stage III -Creatinine on discharge 6.3>5. 8  -[place] creatinine 3.45 in 1.19. Will repeat - Has follow up with Dr Hollie Salk.    Glori Bickers MD 10:56 AM

## 2017-12-01 NOTE — Addendum Note (Signed)
Encounter addended by: Scarlette Calico, RN on: 12/01/2017 11:24 AM  Actions taken: Diagnosis association updated, Order list changed, Sign clinical note

## 2018-01-04 ENCOUNTER — Encounter: Payer: Medicare Other | Admitting: Internal Medicine

## 2018-01-11 ENCOUNTER — Ambulatory Visit (INDEPENDENT_AMBULATORY_CARE_PROVIDER_SITE_OTHER): Payer: Medicare Other | Admitting: Internal Medicine

## 2018-01-11 ENCOUNTER — Other Ambulatory Visit: Payer: Self-pay

## 2018-01-11 ENCOUNTER — Encounter: Payer: Self-pay | Admitting: Internal Medicine

## 2018-01-11 VITALS — BP 116/57 | HR 66 | Temp 97.4°F | Ht 73.0 in | Wt 179.8 lb

## 2018-01-11 DIAGNOSIS — N184 Chronic kidney disease, stage 4 (severe): Secondary | ICD-10-CM | POA: Diagnosis not present

## 2018-01-11 DIAGNOSIS — K0889 Other specified disorders of teeth and supporting structures: Secondary | ICD-10-CM

## 2018-01-11 DIAGNOSIS — I10 Essential (primary) hypertension: Secondary | ICD-10-CM

## 2018-01-11 DIAGNOSIS — R0981 Nasal congestion: Secondary | ICD-10-CM | POA: Diagnosis not present

## 2018-01-11 DIAGNOSIS — Z79899 Other long term (current) drug therapy: Secondary | ICD-10-CM | POA: Diagnosis not present

## 2018-01-11 DIAGNOSIS — I5022 Chronic systolic (congestive) heart failure: Secondary | ICD-10-CM | POA: Diagnosis not present

## 2018-01-11 DIAGNOSIS — I77811 Abdominal aortic ectasia: Secondary | ICD-10-CM

## 2018-01-11 DIAGNOSIS — I130 Hypertensive heart and chronic kidney disease with heart failure and stage 1 through stage 4 chronic kidney disease, or unspecified chronic kidney disease: Secondary | ICD-10-CM | POA: Diagnosis not present

## 2018-01-11 DIAGNOSIS — Z87891 Personal history of nicotine dependence: Secondary | ICD-10-CM | POA: Diagnosis not present

## 2018-01-11 DIAGNOSIS — Z0000 Encounter for general adult medical examination without abnormal findings: Secondary | ICD-10-CM

## 2018-01-11 NOTE — Patient Instructions (Addendum)
Good to see you today [name] [name].  Your blood pressure looks much better today and we will continue on your blood pressure medications without any changes.  Hopefully the improved blood pressure will continue to allow your heart to pump effectively and easily.  I am glad you are feeling better from this cold you are having, continue the antibiotic pills prescribed by your kidney doctor.  I will keep an eye out for the lab results from your kidney doctor appointment yesterday.  We will see you back in the clinic in August or September after you have the repeat echo scan of your heart.

## 2018-01-11 NOTE — Progress Notes (Signed)
   CC: F/u of HFrEF, HTN   HPI:  [name] is a 84 y.o. male with a history of HFrEF (EF 30-35%), CKD, hypertension, and otherwise described below who presents to the clinic for follow-up of chronic medical conditions.  HFrEF: He has established with the heart failure clinic and was recently seen in February.  During an admission in October 2018, EF was noted to be down to 30-35%, which has since improved and was 40-45% according to bedside echo performed in Cardiology clinic. He denies sx of LE swelling, SOB, orthopnea, PND, or chest pain at this time or recently. He was seen in the ED in January for SOB which resolved with diuretic administration.   HTN: He recently started doxazosin 2 mg daily and reports adherence with this change. He denies dizziness, light-headedness, headache, blurry vision.   URI: His only complaint today is an upper respiratory illness with sx of nasal and throat congestion and generalized fatigue for 7-8 days up until yesterday. Denied fever, SOB. He saw his nephrologist who prescribed a course of abx and he feels much better today with only residual congestion noted at today's visit.   CKD: Followed up with Nephrology yesterday and reports labs were drawn at that time, no records available yet.    Past Medical History:  Diagnosis Date  . CHF (congestive heart failure) ([place])   . Heart murmur   . History of gout ~ 1998  . Hyperlipidemia   . Hypertension   . Prostate cancer ([place]) ~ 1998   S/P radiation   Review of Systems:  Review of Systems  Constitutional: Negative for fever.  HENT: Positive for congestion.   Eyes: Negative for blurred vision.  Neurological: Negative for dizziness and headaches.     Physical Exam:  Vitals:   01/11/18 1339  BP: (!) 116/57  Pulse: 66  Temp: (!) 97.4 F (36.3 C)  TempSrc: Oral  SpO2: 95%  Weight: 179 lb 12.8 oz (81.6 kg)  Height: 6\' 1"  (1.854 m)   General: Elderly gentleman sitting in chair  comfortably, no acute distress HEENT: Decreased dentition, no pharyngeal exudate, moist mucus membranes  CV: RRR Resp: Clear breath sounds bilaterally with no basilar crackles or rales, normal work of breathing, no distress  Extr: No LE edema Neuro: Alert and oriented x3  Skin: Warm, dry      Assessment & Plan:   See Encounters Tab for problem based charting.  Patient discussed with Dr. Lynnae January

## 2018-01-12 NOTE — Assessment & Plan Note (Signed)
Received flu vaccine at [facility] back in October

## 2018-01-12 NOTE — Assessment & Plan Note (Signed)
His EF appears to have improved on follow up with HF clinic. He denies any sx and is not currently on a daily diuretic. Scheduled to have repeat echocardiogram and cardiology follow up around August which he was encouraged to keep. He has previously noted that he does not wish to pursue further ischemic eval given his lack of chest pain and renal function. Continue current medication regimen, salt and fluid limitations, and monitor for changes in fluid status.

## 2018-01-12 NOTE — Progress Notes (Signed)
Internal Medicine Clinic Attending  Case discussed with Dr. Harden at the time of the visit.  We reviewed the resident's history and exam and pertinent patient test results.  I agree with the assessment, diagnosis, and plan of care documented in the resident's note.  

## 2018-01-12 NOTE — Assessment & Plan Note (Signed)
Noted on CT with 2.7 cm enlargement of abdominal aorta. Can consider f/u with [place] in 5 years but would discuss with pt at that point given his age and decision to not undergo lexiscan for ischemic cardiac eval.

## 2018-01-12 NOTE — Assessment & Plan Note (Signed)
Last available labs from February show Cr of 4.12 with GFR 14, Cr was 3.45 in January. He is currently bordering between CKD IV and V and is following with Nephrology, updated labs drawn at f/u yesterday per pt report. Will review faxed records when available for up to date information regarding his renal function.

## 2018-01-12 NOTE — Assessment & Plan Note (Signed)
BP is improved with addition of doxazosin 2 mg daily. Also on Hydralazine 100 mg TID, isordil 40 mg TID and reports good adherence. BP today 116/57 with no orthostatic sx reported. Continue regimen without changes.

## 2018-05-16 ENCOUNTER — Encounter: Payer: Self-pay | Admitting: *Deleted

## 2018-06-29 ENCOUNTER — Other Ambulatory Visit (HOSPITAL_COMMUNITY): Payer: Self-pay

## 2018-06-29 MED ORDER — DOXAZOSIN MESYLATE 2 MG PO TABS: 2.0000 mg | ORAL_TABLET | Freq: Every day | ORAL | 5 refills | Status: AC

## 2018-08-01 ENCOUNTER — Ambulatory Visit (INDEPENDENT_AMBULATORY_CARE_PROVIDER_SITE_OTHER): Payer: Medicare Other | Admitting: Internal Medicine

## 2018-08-01 ENCOUNTER — Other Ambulatory Visit: Payer: Self-pay

## 2018-08-01 VITALS — BP 146/66 | HR 68 | Temp 97.7°F | Ht 74.0 in | Wt 177.8 lb

## 2018-08-01 DIAGNOSIS — I129 Hypertensive chronic kidney disease with stage 1 through stage 4 chronic kidney disease, or unspecified chronic kidney disease: Secondary | ICD-10-CM

## 2018-08-01 DIAGNOSIS — R011 Cardiac murmur, unspecified: Secondary | ICD-10-CM | POA: Diagnosis not present

## 2018-08-01 DIAGNOSIS — E559 Vitamin D deficiency, unspecified: Secondary | ICD-10-CM

## 2018-08-01 DIAGNOSIS — N184 Chronic kidney disease, stage 4 (severe): Secondary | ICD-10-CM | POA: Diagnosis not present

## 2018-08-01 DIAGNOSIS — Z87891 Personal history of nicotine dependence: Secondary | ICD-10-CM | POA: Diagnosis not present

## 2018-08-01 DIAGNOSIS — Z23 Encounter for immunization: Secondary | ICD-10-CM

## 2018-08-01 DIAGNOSIS — Z79899 Other long term (current) drug therapy: Secondary | ICD-10-CM

## 2018-08-01 DIAGNOSIS — I10 Essential (primary) hypertension: Secondary | ICD-10-CM

## 2018-08-01 DIAGNOSIS — Z0000 Encounter for general adult medical examination without abnormal findings: Secondary | ICD-10-CM

## 2018-08-01 MED ORDER — AMLODIPINE BESYLATE 10 MG PO TABS: 10.0000 mg | ORAL_TABLET | Freq: Every day | ORAL | 3 refills | Status: AC

## 2018-08-01 NOTE — Progress Notes (Signed)
   CC: Routine for HTN  HPI:[name] [name] is a 84 y.o. male who presents for evaluation of his chronic high blood pressure. Please see individual problem based A/P for details.  HTN: The patient denied chest pain, dyspnea, headache, acute visual changes, nausea, of generalized weakness or peripheral edema. He stated that he has been compliant with his BP meds without issue. Is requesting a refill today on Amlodipine.  Plan: Refilled Amlodipine Continue Hydralazine 100mg  TID Continue Isosorbide dinitrate 40mg  TID  CKD IV: Most recent labs from [facility] indicate a serum creatinine of 2.41 and GFR of 27. We will not repeat these today. He continues to follow with [facility].   Vit D deficiency: Please obtain Vit D at his next appointment  Continue current supplementation   Need for influenza vaccination: Received flu vaccine  PHQ-9: Based on the patients    Office Visit from 08/01/2018 in [facility]  PHQ-9 Total Score  0     score we have decided to monitor.  Past Medical History:  Diagnosis Date  . CHF (congestive heart failure) ([place])   . Heart murmur   . History of gout ~ 1998  . Hyperlipidemia   . Hypertension   . Prostate cancer ([place]) ~ 1998   S/P radiation   Review of Systems:  ROS negative except as per HPI.  Physical Exam: Vitals:   08/01/18 0908  BP: (!) 146/66  Pulse: 68  Temp: 97.7 F (36.5 C)  TempSrc: Oral  SpO2: 98%  Weight: 177 lb 12.8 oz (80.6 kg)  Height: 6\' 2"  (1.88 m)   General: A/O x 4, in no acute distress, afebrile, nondiaphoretic HEENT: PERRL, EOM intact Cardio: RRR, GIII murmur auscultated Pulmonary: CTA bilaterally  MSK: Bilateral lower extremities nontender, nonedematous  Assessment & Plan:   See Encounters Tab for problem based charting.  Patient discussed with Dr. Dareen Piano

## 2018-08-01 NOTE — Patient Instructions (Signed)
FOLLOW-UP INSTRUCTIONS When: Please stop and schedule an appointment with Dr. Truman Hayward in the next 2-4 months for a routine evaluation What to bring: All of your medications  I have refilled your Amlodipine today.   As always if your symptoms worsen, fail to improve, or your develop other concerning symptoms, please notify our office or visit the local ER if we are unavailable.  Thank you for your visit to the [facility] today.

## 2018-08-02 ENCOUNTER — Encounter: Payer: Self-pay | Admitting: Internal Medicine

## 2018-08-02 DIAGNOSIS — Z23 Encounter for immunization: Secondary | ICD-10-CM | POA: Insufficient documentation

## 2018-08-02 NOTE — Assessment & Plan Note (Signed)
  HTN: The patient denied chest pain, dyspnea, headache, acute visual changes, nausea, of generalized weakness or peripheral edema. He stated that he has been compliant with his BP meds without issue. Is requesting a refill today on Amlodipine.  Plan: Refilled Amlodipine Continue Hydralazine 100mg  TID Continue Isosorbide dinitrate 40mg  TID

## 2018-08-02 NOTE — Assessment & Plan Note (Signed)
  Need for influenza vaccination: Received flu vaccine

## 2018-08-02 NOTE — Assessment & Plan Note (Signed)
  CKD IV: Most recent labs from [facility] indicate a serum creatinine of 2.41 and GFR of 27. We will not repeat these today. He continues to follow with [facility].

## 2018-08-02 NOTE — Assessment & Plan Note (Signed)
  Vit D deficiency: Please obtain Vit D at his next appointment  Continue current supplementation

## 2018-08-03 NOTE — Progress Notes (Signed)
Internal Medicine Clinic Attending  Case discussed with Dr. Harbrecht at the time of the visit.  We reviewed the resident's history and exam and pertinent patient test results.  I agree with the assessment, diagnosis, and plan of care documented in the resident's note.   

## 2018-08-17 ENCOUNTER — Other Ambulatory Visit: Payer: Self-pay | Admitting: Internal Medicine

## 2018-09-16 ENCOUNTER — Other Ambulatory Visit: Payer: Self-pay | Admitting: Internal Medicine

## 2018-09-29 ENCOUNTER — Other Ambulatory Visit: Payer: Self-pay | Admitting: *Deleted

## 2018-09-29 MED ORDER — CALCITRIOL 0.25 MCG PO CAPS: 0.2500 ug | ORAL_CAPSULE | Freq: Every day | ORAL | 5 refills | Status: AC

## 2018-10-15 ENCOUNTER — Other Ambulatory Visit (HOSPITAL_COMMUNITY): Payer: Self-pay | Admitting: Adult Health

## 2018-10-15 DIAGNOSIS — I10 Essential (primary) hypertension: Secondary | ICD-10-CM

## 2018-10-15 DIAGNOSIS — I5041 Acute combined systolic (congestive) and diastolic (congestive) heart failure: Secondary | ICD-10-CM

## 2018-10-16 ENCOUNTER — Other Ambulatory Visit (HOSPITAL_COMMUNITY): Payer: Self-pay | Admitting: Adult Health

## 2018-12-13 ENCOUNTER — Ambulatory Visit (INDEPENDENT_AMBULATORY_CARE_PROVIDER_SITE_OTHER): Payer: Medicare Other | Admitting: Internal Medicine

## 2018-12-13 ENCOUNTER — Telehealth: Payer: Self-pay | Admitting: *Deleted

## 2018-12-13 ENCOUNTER — Other Ambulatory Visit: Payer: Self-pay

## 2018-12-13 ENCOUNTER — Encounter: Payer: Self-pay | Admitting: Internal Medicine

## 2018-12-13 VITALS — BP 142/68 | HR 57 | Temp 97.6°F | Ht 74.0 in | Wt 181.8 lb

## 2018-12-13 DIAGNOSIS — I130 Hypertensive heart and chronic kidney disease with heart failure and stage 1 through stage 4 chronic kidney disease, or unspecified chronic kidney disease: Secondary | ICD-10-CM

## 2018-12-13 DIAGNOSIS — N184 Chronic kidney disease, stage 4 (severe): Secondary | ICD-10-CM | POA: Diagnosis not present

## 2018-12-13 DIAGNOSIS — Z79899 Other long term (current) drug therapy: Secondary | ICD-10-CM | POA: Diagnosis not present

## 2018-12-13 DIAGNOSIS — Z87891 Personal history of nicotine dependence: Secondary | ICD-10-CM

## 2018-12-13 DIAGNOSIS — R0609 Other forms of dyspnea: Secondary | ICD-10-CM

## 2018-12-13 DIAGNOSIS — E785 Hyperlipidemia, unspecified: Secondary | ICD-10-CM

## 2018-12-13 DIAGNOSIS — R0601 Orthopnea: Secondary | ICD-10-CM

## 2018-12-13 DIAGNOSIS — R0600 Dyspnea, unspecified: Secondary | ICD-10-CM | POA: Diagnosis present

## 2018-12-13 DIAGNOSIS — Z7982 Long term (current) use of aspirin: Secondary | ICD-10-CM

## 2018-12-13 DIAGNOSIS — I5022 Chronic systolic (congestive) heart failure: Secondary | ICD-10-CM | POA: Diagnosis not present

## 2018-12-13 DIAGNOSIS — I10 Essential (primary) hypertension: Secondary | ICD-10-CM

## 2018-12-13 NOTE — Assessment & Plan Note (Signed)
Resting pulse ox: 86-90% Walking pulse ox: 78-80% Resting pulse ox w/ 2L oxygen [place]: 96-98% Walking pulse ox w/ 2L oxygen [place]: 94-96%  Presenting with 5 week hx of subjective dyspnea w/ exertion. Has known systolic heart failure with EF of 35%. Also has prior 40 pack year smoking history, although he quit 20 years ago. CT Abd/Pelvis noted diffuse ground glass opacities and right pleural density but patient had refused aggressive work-up in the past considering his advanced age. Differential include heart failure exacerbation vs COPD vs restrictive lung disease vs malignancy. Oxygen saturation measured in clinic with proven oxygen requirements. Will do work-up as tolerated by patient.  - DME home oxygen ordered - Chest X-ray

## 2018-12-13 NOTE — Assessment & Plan Note (Signed)
History of systolic heart failure w/ last Echo in 07/2017 showing EF of 35%. Currently endorsing worsening dyspnea. Ischemic work-up declined due to patient preference. Has established care with Dr.Benshimon at HF clinic. Currently not on data-driven medications proven to reduce mortality in HF, such as beta-blocker and Ace-inhibitor, due to bradycardia and CKD4. Does not appear grossly volume overloaded on exam. Blood pressure stable.   - Recommend salt and fluid limitation - Chest X-ray to evaluate dyspnea, volume status - C/w current anti-hypertensive regimen, aspirin, statin

## 2018-12-13 NOTE — Addendum Note (Signed)
Addended by: Lucious Groves on: 12/13/2018 03:37 PM   Modules accepted: Orders

## 2018-12-13 NOTE — Telephone Encounter (Signed)
Spoke with Estill Bamberg at [facility] for home oxygen at 2 L via [place]. Patient is requesting portable oxygen. Order placed by PCP. Rep will deliver oxygen tank within 30 minutes. Hubbard Hartshorn, RN, BSN

## 2018-12-13 NOTE — Patient Instructions (Signed)
Thank you for allowing [place] to provide your care today. Today we discussed your shortness of breath    I have ordered Chest X-ray for you. I will call if any are abnormal.    Today we made no changes to your medications.    Please follow-up in 1 month.    Should you have any questions or concerns please call the internal medicine clinic at [phone].     Home Oxygen Use, Adult When a medical condition keeps you from getting enough oxygen, your health care provider may instruct you to take extra oxygen at home. Your health care provider will let you know:  When to take oxygen.  For how long to take oxygen.  How quickly oxygen should be delivered (flow rate), in liters per minute (LPM or L/M). Home oxygen can be given through:  A mask.  A nasal cannula. This is a device or tube that goes in the nostrils.  A transtracheal catheter. This is a small, flexible tube placed in the trachea.  A tracheostomy. This is a surgically made opening in the trachea. These devices are connected with tubing to an oxygen source, such as:  A tank. Tanks hold oxygen in gas form. They must be replaced when the oxygen is used up.  A liquid oxygen device. This holds oxygen in liquid form. It must be replaced when the oxygen is used up.  An oxygen concentrator machine. This filters oxygen in the room. It uses electricity, so you must have a backup cylinder of oxygen in case the power goes out. Supplies needed: To use oxygen, you will need:  A mask, nasal cannula, transtracheal catheter, or tracheostomy.  An oxygen tank, a liquid oxygen device, or an oxygen concentrator.  The tape that your health care provider recommends (optional). If you use a transtracheal catheter and your prescribed flow rate is 1 LPM or greater, you will also need a humidifier. Risks and complications  Fire. This can happen if the oxygen is exposed to a heat source, flame, or spark.  Injury to skin. This can happen if  liquid oxygen touches your skin.  Organ damage. This can happen if you get too little oxygen. How to use oxygen Your health care provider or a representative from your [place] will show you how to use your oxygen device. Follow her or his instructions. The instructions may look something like this: 1. Wash your hands. 2. If you use an oxygen concentrator, make sure it is plugged in. 3. Place one end of the tube into the port on the tank, device, or machine. 4. Place the mask over your nose and mouth. Or, place the nasal cannula and secure it with tape if instructed. If you use a tracheostomy or transtracheal catheter, connect it to the oxygen source as directed. 5. Make sure the liter-flow setting on the machine is at the level prescribed by your health care provider. 6. Turn on the machine or adjust the knob on the tank or device to the correct liter-flow setting. 7. When you are done, turn off and unplug the machine, or turn the knob to OFF. How to clean and care for the oxygen supplies Nasal cannula  Clean it with a warm, wet cloth daily or as needed.  Wash it with a liquid soap once a week.  Rinse it thoroughly once or twice a week.  Replace it every 2-4 weeks.  If you have an infection, such as a cold or pneumonia, change the  cannula when you get better. Mask  Replace it every 2-4 weeks.  If you have an infection, such as a cold or pneumonia, change the mask when you get better. Humidifier bottle  Wash the bottle between each refill: ? Wash it with soap and warm water. ? Rinse it thoroughly. ? Disinfect it and its top. ? Air-dry it.  Make sure it is dry before you refill it. Oxygen concentrator  Clean the air filter at least twice a week according to directions from your home medical equipment and service company.  Wipe down the cabinet every day. To do this: ? Unplug the unit. ? Wipe down the cabinet with a damp cloth. ? Dry the cabinet. Other  equipment  Change any extra tubing every 1-3 months.  Follow instructions from your health care provider about taking care of any other equipment. Safety tips Fire safety tips   Keep your oxygen and oxygen supplies at least 5 ft away from sources of heat, flames, and sparks at all times.  Do not allow smoking near your oxygen. Put up "no smoking" signs in your home. Avoid smoking areas when in public.  Do not use materials that can burn (are flammable) while you use oxygen.  When you go to a restaurant with portable oxygen, ask to be seated in the nonsmoking section.  Keep a Data processing manager close by. Let your fire department know that you have oxygen in your home.  Test your home smoke detectors regularly. Traveling  Secure your oxygen tank in the vehicle so that it does not move around. Follow instructions from your medical device company about how to safely secure your tank.  Make sure you have enough oxygen for the amount of time you will be away from home.  If you are planning air travel, contact the airline to find out if they allow the use of an approved portable oxygen concentrator. You may also need documents from your health care provider and medical device company before you travel. General safety tips  If you use an oxygen cylinder, make sure it is in a stand or secured to an object that will not move (fixed object).  If you use liquid oxygen, make sure its container is kept upright.  If you use an oxygen concentrator: ? Dance movement psychotherapist company. Make sure you are given priority service in the event that your power goes out. ? Avoid using extension cords, if possible. Follow these instructions at home:  Use oxygen only as told by your health care provider.  Do not use alcohol or other drugs that make you relax (sedating drugs) unless instructed. They can slow down your breathing rate and make it hard to get in enough oxygen.  Know how and when to order a  refill of oxygen.  Always keep a spare tank of oxygen. Plan ahead for holidays when you may not be able to get a prescription filled.  Use water-based lubricants on your lips or nostrils. Do not use oil-based products like petroleum jelly.  To prevent skin irritation on your cheeks or behind your ears, tuck some gauze under the tubing. Contact a health care provider if:  You get headaches often.  You have shortness of breath.  You have a lasting cough.  You have anxiety.  You are sleepy all the time.  You develop an illness that affects your breathing.  You cannot exercise at your regular level.  You are restless.  You have difficult or irregular breathing, and  it is getting worse.  You have a fever.  You have persistent redness under your nose. Get help right away if:  You are confused.  You have blue lips or fingernails.  You are struggling to breathe. Summary  Your health care provider or a representative from your [place] will show you how to use your oxygen device. Follow her or his instructions.  If you use an oxygen concentrator, make sure it is plugged in.  Make sure the liter-flow setting on the machine is at the level prescribed by your health care provider.  Keep your oxygen and oxygen supplies at least 5 ft away from sources of heat, flames, and sparks at all times. This information is not intended to replace advice given to you by your health care provider. Make sure you discuss any questions you have with your health care provider. Document Released: 01/02/2004 Document Revised: 03/31/2018 Document Reviewed: 05/05/2016 Elsevier Interactive Patient Education  2019 [employer].

## 2018-12-13 NOTE — Progress Notes (Signed)
CC: shortness of breath  HPI: Mr.Heather H Cawood is a 85 y.o. M w/ PMH of HFrEF, HTN, CKD4, HLD and ACD presenting to clinic for management of his chronic conditions. He was in his usual state of health until 5 weeks ago when he began to have shortness of breath while laying supine as well as with exertion. He states he was told during his prior hospitalization in 2018 that he requires oxygen at home but refused it at that time because he believed he 'could go without it.' He feels that his dyspnea has progressively worsened over time. He is able to walk from front of his home to the back before he starts to feel dyspneic. This shortness of breath has interfered with his daily activities, including cleaning up the house and walking with his wife. Denies any fevers or chills. Denies any chest pain, palpitations, headache, light-headedness, dizziness.   Past Medical History:  Diagnosis Date  . CHF (congestive heart failure) ([place])   . Heart murmur   . History of gout ~ 1998  . Hyperlipidemia   . Hypertension   . Prostate cancer ([place]) ~ 1998   S/P radiation   Review of Systems: Review of Systems  Constitutional: Negative for chills, fever and malaise/fatigue.  Eyes: Negative for blurred vision.  Respiratory: Positive for shortness of breath. Negative for cough, sputum production and wheezing.   Cardiovascular: Positive for orthopnea. Negative for chest pain, palpitations and leg swelling.  Gastrointestinal: Negative for constipation, diarrhea, nausea and vomiting.  Neurological: Negative for dizziness.     Physical Exam: Vitals:   12/13/18 1321 12/13/18 1344  BP: (!) 152/64 (!) 142/68  Pulse: (!) 57   Temp: 97.6 F (36.4 C)   TempSrc: Oral   SpO2: (!) 86%   Weight: 181 lb 12.8 oz (82.5 kg)   Height: 6\' 2"  (1.88 m)    Resting pulse ox: 86-90% Walking pulse ox: 78-80% Resting pulse ox w/ 2L oxygen [place]: 96-98% Walking pulse ox w/ 2L oxygen [place]: 94-96%  Physical Exam    Constitutional: He is oriented to person, place, and time. He appears well-developed and well-nourished.  Thin-appearing  Neck: Normal range of motion. Neck supple. No JVD present.  Cardiovascular: Normal rate, regular rhythm, normal heart sounds and intact distal pulses.  No murmur heard. Respiratory: Effort normal and breath sounds normal. He has no wheezes. He has no rales.  Distant breath sounds  GI: Soft. Bowel sounds are normal. There is no abdominal tenderness.  Musculoskeletal: Normal range of motion.        General: No edema.  Neurological: He is alert and oriented to person, place, and time.  Skin: Skin is warm and dry.     Assessment & Plan:   Essential hypertension BP Readings from Last 3 Encounters:  12/13/18 (!) 142/68  08/01/18 (!) 146/66  01/11/18 (!) 116/57   Elevated blood pressure during last two visits. Currently on amlodipine, hydralazine, and isosorbide dinitrate 40mg  TID. His blood pressure regimen is limited by his CKD4. Currently denies any headache, chest pain or blurry vision. He has been seen by different providers during the last 3 clinic visit. Will observe for now and recheck at next visit with me and see if white coat hypertension played a part.  - C/w current regimen: amlodipine 10mg  daily, hydralazine 100mg  TID, Isosorbide dinitrate 40mg  TID  Chronic HFrEF (heart failure with reduced ejection fraction) ([place]) History of systolic heart failure w/ last Echo in 07/2017 showing EF of 35%.  Currently endorsing worsening dyspnea. Ischemic work-up declined due to patient preference. Has established care with Dr.Benshimon at HF clinic. Currently not on data-driven medications proven to reduce mortality in HF, such as beta-blocker and Ace-inhibitor, due to bradycardia and CKD4. Does not appear grossly volume overloaded on exam. Blood pressure stable.   - Recommend salt and fluid limitation - Chest X-ray to evaluate dyspnea, volume status - C/w current  anti-hypertensive regimen, aspirin, statin  Chronic dyspnea Resting pulse ox: 86-90% Walking pulse ox: 78-80% Resting pulse ox w/ 2L oxygen [place]: 96-98% Walking pulse ox w/ 2L oxygen [place]: 94-96%  Presenting with 5 week hx of subjective dyspnea w/ exertion. Has known systolic heart failure with EF of 35%. Also has prior 40 pack year smoking history, although he quit 20 years ago. CT Abd/Pelvis noted diffuse ground glass opacities and right pleural density but patient had refused aggressive work-up in the past considering his advanced age. Differential include heart failure exacerbation vs COPD vs restrictive lung disease vs malignancy. Oxygen saturation measured in clinic with proven oxygen requirements. Will do work-up as tolerated by patient.  - DME home oxygen ordered - Chest X-ray    Patient discussed with Dr. Evette Doffing   -Gilberto Better, PGY1

## 2018-12-13 NOTE — Assessment & Plan Note (Signed)
BP Readings from Last 3 Encounters:  12/13/18 (!) 142/68  08/01/18 (!) 146/66  01/11/18 (!) 116/57   Elevated blood pressure during last two visits. Currently on amlodipine, hydralazine, and isosorbide dinitrate 40mg  TID. His blood pressure regimen is limited by his CKD4. Currently denies any headache, chest pain or blurry vision. He has been seen by different providers during the last 3 clinic visit. Will observe for now and recheck at next visit with me and see if white coat hypertension played a part.  - C/w current regimen: amlodipine 10mg  daily, hydralazine 100mg  TID, Isosorbide dinitrate 40mg  TID

## 2018-12-14 NOTE — Progress Notes (Signed)
Internal Medicine Clinic Attending ° °Case discussed with Dr. Lee at the time of the visit.  We reviewed the resident’s history and exam and pertinent patient test results.  I agree with the assessment, diagnosis, and plan of care documented in the resident’s note.  °

## 2018-12-24 ENCOUNTER — Encounter (HOSPITAL_COMMUNITY): Payer: Self-pay

## 2018-12-24 ENCOUNTER — Inpatient Hospital Stay (HOSPITAL_COMMUNITY)
Admission: EM | Admit: 2018-12-24 | Discharge: 2019-01-25 | DRG: 291 | Disposition: E | Payer: Medicare Other | Attending: Student in an Organized Health Care Education/Training Program | Admitting: Student in an Organized Health Care Education/Training Program

## 2018-12-24 ENCOUNTER — Other Ambulatory Visit: Payer: Self-pay

## 2018-12-24 ENCOUNTER — Emergency Department (HOSPITAL_COMMUNITY): Payer: Medicare Other

## 2018-12-24 DIAGNOSIS — Z992 Dependence on renal dialysis: Secondary | ICD-10-CM

## 2018-12-24 DIAGNOSIS — N184 Chronic kidney disease, stage 4 (severe): Secondary | ICD-10-CM | POA: Diagnosis present

## 2018-12-24 DIAGNOSIS — Z9981 Dependence on supplemental oxygen: Secondary | ICD-10-CM

## 2018-12-24 DIAGNOSIS — N2581 Secondary hyperparathyroidism of renal origin: Secondary | ICD-10-CM | POA: Diagnosis present

## 2018-12-24 DIAGNOSIS — Z7982 Long term (current) use of aspirin: Secondary | ICD-10-CM

## 2018-12-24 DIAGNOSIS — E875 Hyperkalemia: Secondary | ICD-10-CM | POA: Diagnosis present

## 2018-12-24 DIAGNOSIS — Z923 Personal history of irradiation: Secondary | ICD-10-CM

## 2018-12-24 DIAGNOSIS — R34 Anuria and oliguria: Secondary | ICD-10-CM | POA: Diagnosis not present

## 2018-12-24 DIAGNOSIS — I5023 Acute on chronic systolic (congestive) heart failure: Secondary | ICD-10-CM | POA: Diagnosis present

## 2018-12-24 DIAGNOSIS — Z87891 Personal history of nicotine dependence: Secondary | ICD-10-CM

## 2018-12-24 DIAGNOSIS — R011 Cardiac murmur, unspecified: Secondary | ICD-10-CM | POA: Diagnosis present

## 2018-12-24 DIAGNOSIS — N170 Acute kidney failure with tubular necrosis: Secondary | ICD-10-CM | POA: Diagnosis not present

## 2018-12-24 DIAGNOSIS — I5043 Acute on chronic combined systolic (congestive) and diastolic (congestive) heart failure: Secondary | ICD-10-CM | POA: Diagnosis present

## 2018-12-24 DIAGNOSIS — M109 Gout, unspecified: Secondary | ICD-10-CM | POA: Diagnosis present

## 2018-12-24 DIAGNOSIS — N179 Acute kidney failure, unspecified: Secondary | ICD-10-CM | POA: Diagnosis present

## 2018-12-24 DIAGNOSIS — Z8546 Personal history of malignant neoplasm of prostate: Secondary | ICD-10-CM

## 2018-12-24 DIAGNOSIS — I959 Hypotension, unspecified: Secondary | ICD-10-CM | POA: Diagnosis not present

## 2018-12-24 DIAGNOSIS — R0602 Shortness of breath: Secondary | ICD-10-CM

## 2018-12-24 DIAGNOSIS — E785 Hyperlipidemia, unspecified: Secondary | ICD-10-CM | POA: Diagnosis present

## 2018-12-24 DIAGNOSIS — N189 Chronic kidney disease, unspecified: Secondary | ICD-10-CM

## 2018-12-24 DIAGNOSIS — I130 Hypertensive heart and chronic kidney disease with heart failure and stage 1 through stage 4 chronic kidney disease, or unspecified chronic kidney disease: Principal | ICD-10-CM | POA: Diagnosis present

## 2018-12-24 DIAGNOSIS — Z66 Do not resuscitate: Secondary | ICD-10-CM | POA: Diagnosis not present

## 2018-12-24 DIAGNOSIS — N19 Unspecified kidney failure: Secondary | ICD-10-CM

## 2018-12-24 DIAGNOSIS — D631 Anemia in chronic kidney disease: Secondary | ICD-10-CM | POA: Diagnosis present

## 2018-12-24 DIAGNOSIS — J9601 Acute respiratory failure with hypoxia: Secondary | ICD-10-CM | POA: Diagnosis present

## 2018-12-24 DIAGNOSIS — G9340 Encephalopathy, unspecified: Secondary | ICD-10-CM | POA: Diagnosis present

## 2018-12-24 DIAGNOSIS — Z515 Encounter for palliative care: Secondary | ICD-10-CM | POA: Diagnosis not present

## 2018-12-24 DIAGNOSIS — R001 Bradycardia, unspecified: Secondary | ICD-10-CM | POA: Diagnosis not present

## 2018-12-24 DIAGNOSIS — N400 Enlarged prostate without lower urinary tract symptoms: Secondary | ICD-10-CM | POA: Diagnosis present

## 2018-12-24 DIAGNOSIS — Z8249 Family history of ischemic heart disease and other diseases of the circulatory system: Secondary | ICD-10-CM

## 2018-12-24 LAB — BASIC METABOLIC PANEL
ANION GAP: 6 (ref 5–15)
BUN: 42 mg/dL — ABNORMAL HIGH (ref 8–23)
CO2: 27 mmol/L (ref 22–32)
Calcium: 8.6 mg/dL — ABNORMAL LOW (ref 8.9–10.3)
Chloride: 108 mmol/L (ref 98–111)
Creatinine, Ser: 3.27 mg/dL — ABNORMAL HIGH (ref 0.61–1.24)
GFR calc Af Amer: 19 mL/min — ABNORMAL LOW (ref >60)
GFR calc non Af Amer: 16 mL/min — ABNORMAL LOW (ref >60)
Glucose, Bld: 109 mg/dL — ABNORMAL HIGH (ref 70–99)
Potassium: 5.3 mmol/L — ABNORMAL HIGH (ref 3.5–5.1)
Sodium: 141 mmol/L (ref 135–145)

## 2018-12-24 LAB — HEPATIC FUNCTION PANEL
ALK PHOS: 47 U/L (ref 38–126)
ALT: 14 U/L (ref 0–44)
AST: 27 U/L (ref 15–41)
Albumin: 3.5 g/dL (ref 3.5–5.0)
BILIRUBIN INDIRECT: 0.6 mg/dL (ref 0.3–0.9)
Bilirubin, Direct: 0.4 mg/dL — ABNORMAL HIGH (ref 0.0–0.2)
TOTAL PROTEIN: 7 g/dL (ref 6.5–8.1)
Total Bilirubin: 1 mg/dL (ref 0.3–1.2)

## 2018-12-24 LAB — URINALYSIS, ROUTINE W REFLEX MICROSCOPIC
Bacteria, UA: NONE SEEN
Bilirubin Urine: NEGATIVE
Glucose, UA: NEGATIVE mg/dL
Hgb urine dipstick: NEGATIVE
Ketones, ur: NEGATIVE mg/dL
Leukocytes,Ua: NEGATIVE
Nitrite: NEGATIVE
Protein, ur: 30 mg/dL — AB
SPECIFIC GRAVITY, URINE: 1.016 (ref 1.005–1.030)
pH: 5 (ref 5.0–8.0)

## 2018-12-24 LAB — CBC
HCT: 39.1 % (ref 39.0–52.0)
Hemoglobin: 11.7 g/dL — ABNORMAL LOW (ref 13.0–17.0)
MCH: 30.5 pg (ref 26.0–34.0)
MCHC: 29.9 g/dL — ABNORMAL LOW (ref 30.0–36.0)
MCV: 101.8 fL — ABNORMAL HIGH (ref 80.0–100.0)
NRBC: 0 % (ref 0.0–0.2)
PLATELETS: 156 10*3/uL (ref 150–400)
RBC: 3.84 MIL/uL — AB (ref 4.22–5.81)
RDW: 13.2 % (ref 11.5–15.5)
WBC: 2.9 10*3/uL — ABNORMAL LOW (ref 4.0–10.5)

## 2018-12-24 LAB — I-STAT TROPONIN, ED: Troponin i, poc: 0.02 ng/mL (ref 0.00–0.08)

## 2018-12-24 LAB — BRAIN NATRIURETIC PEPTIDE: B Natriuretic Peptide: 1595.7 pg/mL — ABNORMAL HIGH (ref 0.0–100.0)

## 2018-12-24 MED ORDER — CALCITRIOL 0.25 MCG PO CAPS
0.2500 ug | ORAL_CAPSULE | Freq: Every day | ORAL | Status: DC
  Administered 2018-12-25 – 2018-12-26 (×2): 0.25 ug via ORAL
  Filled 2018-12-24 (×3): qty 1

## 2018-12-24 MED ORDER — FUROSEMIDE 10 MG/ML IJ SOLN
40.0000 mg | Freq: Once | INTRAMUSCULAR | Status: AC
  Administered 2018-12-24: 40 mg via INTRAVENOUS
  Filled 2018-12-24: qty 4

## 2018-12-24 MED ORDER — FERROUS SULFATE 325 (65 FE) MG PO TABS
325.0000 mg | ORAL_TABLET | Freq: Every evening | ORAL | Status: DC
  Administered 2018-12-24 – 2018-12-26 (×3): 325 mg via ORAL
  Filled 2018-12-24 (×3): qty 1

## 2018-12-24 MED ORDER — ASPIRIN EC 81 MG PO TBEC
81.0000 mg | DELAYED_RELEASE_TABLET | Freq: Every day | ORAL | Status: DC
  Administered 2018-12-25 – 2018-12-26 (×2): 81 mg via ORAL
  Filled 2018-12-24 (×3): qty 1

## 2018-12-24 MED ORDER — DOXAZOSIN MESYLATE 2 MG PO TABS
2.0000 mg | ORAL_TABLET | Freq: Every day | ORAL | Status: DC
  Administered 2018-12-25 – 2018-12-26 (×2): 2 mg via ORAL
  Filled 2018-12-24 (×2): qty 1

## 2018-12-24 MED ORDER — TIMOLOL MALEATE 0.5 % OP SOLN
1.0000 [drp] | Freq: Every day | OPHTHALMIC | Status: DC
  Administered 2018-12-25 – 2018-12-27 (×3): 1 [drp] via OPHTHALMIC
  Filled 2018-12-24: qty 5

## 2018-12-24 MED ORDER — ACETAMINOPHEN 650 MG RE SUPP: 650.0000 mg | Freq: Four times a day (QID) | RECTAL | Status: DC | PRN

## 2018-12-24 MED ORDER — BRIMONIDINE TARTRATE 0.2 % OP SOLN
1.0000 [drp] | Freq: Two times a day (BID) | OPHTHALMIC | Status: DC
  Administered 2018-12-24 – 2018-12-27 (×7): 1 [drp] via OPHTHALMIC
  Filled 2018-12-24: qty 5

## 2018-12-24 MED ORDER — SIMVASTATIN 20 MG PO TABS
10.0000 mg | ORAL_TABLET | Freq: Every day | ORAL | Status: DC
  Administered 2018-12-25 – 2018-12-27 (×2): 10 mg via ORAL
  Filled 2018-12-24 (×3): qty 1

## 2018-12-24 MED ORDER — FUROSEMIDE 10 MG/ML IJ SOLN
20.0000 mg | Freq: Once | INTRAMUSCULAR | Status: AC
  Administered 2018-12-24: 20 mg via INTRAVENOUS
  Filled 2018-12-24: qty 2

## 2018-12-24 MED ORDER — ISOSORBIDE DINITRATE 10 MG PO TABS
40.0000 mg | ORAL_TABLET | Freq: Three times a day (TID) | ORAL | Status: DC
  Administered 2018-12-24: 40 mg via ORAL
  Filled 2018-12-24: qty 4

## 2018-12-24 MED ORDER — ENOXAPARIN SODIUM 30 MG/0.3ML ~~LOC~~ SOLN
30.0000 mg | SUBCUTANEOUS | Status: DC
  Administered 2018-12-25 – 2018-12-26 (×2): 30 mg via SUBCUTANEOUS
  Filled 2018-12-24 (×3): qty 0.3

## 2018-12-24 MED ORDER — LATANOPROST 0.005 % OP SOLN
1.0000 [drp] | Freq: Every day | OPHTHALMIC | Status: DC
  Administered 2018-12-24 – 2018-12-27 (×4): 1 [drp] via OPHTHALMIC
  Filled 2018-12-24: qty 2.5

## 2018-12-24 MED ORDER — AMLODIPINE BESYLATE 10 MG PO TABS: 10.0000 mg | ORAL_TABLET | Freq: Every day | ORAL | Status: DC

## 2018-12-24 MED ORDER — ACETAMINOPHEN 325 MG PO TABS: 650.0000 mg | ORAL_TABLET | Freq: Four times a day (QID) | ORAL | Status: DC | PRN

## 2018-12-24 MED ORDER — HYDRALAZINE HCL 50 MG PO TABS
100.0000 mg | ORAL_TABLET | Freq: Three times a day (TID) | ORAL | Status: DC
  Administered 2018-12-24: 100 mg via ORAL
  Filled 2018-12-24: qty 2

## 2018-12-24 NOTE — ED Notes (Signed)
ED TO INPATIENT HANDOFF REPORT  ED Nurse Name and Phone #: [phone]  S Name/Age/Gender [name] [name] 85 y.o. male Room/Bed: 021C/021C  Code Status   Code Status: Prior  Home/SNF/Other Home Patient oriented to: self, place, time and situation Is this baseline? Yes   Triage Complete: Triage complete  Chief Complaint SOB  Triage Note Pt arrives to ED with complaints of shortness of breath; pt ws 76% on room air with fire, 99% on non-rebreather. Pt 90% on room air upon arrival. Pt has hx of CHF but is controlled. Pt states he has been feeling more tired than usual as well.   Allergies No Known Allergies  Level of Care/Admitting Diagnosis ED Disposition    ED Disposition Condition Comment   Admit  Hospital Area: [facility] [[id number]]  Level of Care: Cardiac Telemetry [103]  Diagnosis: Acute HFrEF (heart failure with reduced ejection fraction) [facility]) [[id number]]  Admitting Physician: Sid Falcon [phone]  Attending Physician: Sid Falcon [[id number]  PT Class (Do Not Modify): Observation [104]  PT Acc Code (Do Not Modify): Observation [10022]       B Medical/Surgery History Past Medical History:  Diagnosis Date  . CHF (congestive heart failure) ([place])   . Heart murmur   . History of gout ~ 1998  . Hyperlipidemia   . Hypertension   . Prostate cancer ([place]) ~ 1998   S/P radiation   Past Surgical History:  Procedure Laterality Date  . CATARACT EXTRACTION W/ INTRAOCULAR LENS  IMPLANT, BILATERAL Bilateral   . CYSTOSCOPY W/ URETERAL STENT PLACEMENT Left 08/21/2017   Procedure: CYSTOSCOPY WITH RETROGRADE PYELOGRAM/URETERAL JJ STENT PLACEMENT;  Surgeon: Ceasar Mons, MD;  Location: [facility];  Service: Urology;  Laterality: Left;     A IV Location/Drains/Wounds Patient Lines/Drains/Airways Status   Active Line/Drains/Airways    Name:   Placement date:   Placement time:   Site:   Days:   Peripheral IV 12/13/2018 Left Forearm   12/19/2018     1536    Forearm   less than 1   Ureteral Drain/Stent Left ureter 6 Fr.   08/21/17    1650    Left ureter   490          Intake/Output Last 24 hours No intake or output data in the 24 hours ending 12/23/2018 1801  Labs/Imaging Results for orders placed or performed during the hospital encounter of 12/22/2018 (from the past 48 hour(s))  Basic metabolic panel     Status: Abnormal   Collection Time: 12/04/2018  3:44 PM  Result Value Ref Range   Sodium 141 135 - 145 mmol/L   Potassium 5.3 (H) 3.5 - 5.1 mmol/L   Chloride 108 98 - 111 mmol/L   CO2 27 22 - 32 mmol/L   Glucose, Bld 109 (H) 70 - 99 mg/dL   BUN 42 (H) 8 - 23 mg/dL   Creatinine, Ser 3.27 (H) 0.61 - 1.24 mg/dL   Calcium 8.6 (L) 8.9 - 10.3 mg/dL   GFR calc non Af Amer 16 (L) >60 mL/min   GFR calc Af Amer 19 (L) >60 mL/min   Anion gap 6 5 - 15    Comment: Performed at [facility], [address], [place] [address]  CBC     Status: Abnormal   Collection Time: 12/08/2018  3:44 PM  Result Value Ref Range   WBC 2.9 (L) 4.0 - 10.5 K/uL   RBC 3.84 (L) 4.22 - 5.81  MIL/uL   Hemoglobin 11.7 (L) 13.0 - 17.0 g/dL   HCT 39.1 39.0 - 52.0 %   MCV 101.8 (H) 80.0 - 100.0 fL   MCH 30.5 26.0 - 34.0 pg   MCHC 29.9 (L) 30.0 - 36.0 g/dL   RDW 13.2 11.5 - 15.5 %   Platelets 156 150 - 400 K/uL   nRBC 0.0 0.0 - 0.2 %    Comment: Performed at [facility] [address], [place], [place] [address]  Brain natriuretic peptide     Status: Abnormal   Collection Time: 11/29/2018  3:44 PM  Result Value Ref Range   B Natriuretic Peptide 1,595.7 (H) 0.0 - 100.0 pg/mL    Comment: Performed at [facility] [address], [place], [place] [address]  Hepatic function panel     Status: Abnormal   Collection Time: 12/10/2018  3:44 PM  Result Value Ref Range   Total Protein 7.0 6.5 - 8.1 g/dL   Albumin 3.5 3.5 - 5.0 g/dL   AST 27 15 - 41 U/L   ALT 14 0 - 44 U/L   Alkaline Phosphatase 47 38 - 126 U/L   Total Bilirubin 1.0 0.3 - 1.2  mg/dL   Bilirubin, Direct 0.4 (H) 0.0 - 0.2 mg/dL   Indirect Bilirubin 0.6 0.3 - 0.9 mg/dL    Comment: Performed at [facility] [address] [place], [place] [address]  I-stat troponin, ED     Status: None   Collection Time: 12/22/2018  3:49 PM  Result Value Ref Range   Troponin i, poc 0.02 0.00 - 0.08 ng/mL   Comment 3            Comment: Due to the release kinetics of cTnI, a negative result within the first hours of the onset of symptoms does not rule out myocardial infarction with certainty. If myocardial infarction is still suspected, repeat the test at appropriate intervals.   Urinalysis, Routine w reflex microscopic     Status: Abnormal   Collection Time: 12/17/2018  5:04 PM  Result Value Ref Range   Color, Urine YELLOW YELLOW   APPearance CLEAR CLEAR   Specific Gravity, Urine 1.016 1.005 - 1.030   pH 5.0 5.0 - 8.0   Glucose, UA NEGATIVE NEGATIVE mg/dL   Hgb urine dipstick NEGATIVE NEGATIVE   Bilirubin Urine NEGATIVE NEGATIVE   Ketones, ur NEGATIVE NEGATIVE mg/dL   Protein, ur 30 (A) NEGATIVE mg/dL   Nitrite NEGATIVE NEGATIVE   Leukocytes,Ua NEGATIVE NEGATIVE   RBC / HPF 0-5 0 - 5 RBC/hpf   WBC, UA 0-5 0 - 5 WBC/hpf   Bacteria, UA NONE SEEN NONE SEEN   Squamous Epithelial / LPF 0-5 0 - 5   Mucus PRESENT    Hyaline Casts, UA PRESENT     Comment: Performed at [facility], [address] [place], [place] [address]   Dg Chest 2 View  Result Date: 12/05/2018 CLINICAL DATA:  Chest pain and shortness of breath for 2 weeks. EXAM: CHEST - 2 VIEW COMPARISON:  11/04/2017 FINDINGS: Increase in cardiac enlargement. Bilateral pleural effusions are identified which are also increased in the interval. Diffuse bilateral hazy lung opacities compatible with moderate pulmonary edema. IMPRESSION: 1. Moderate CHF pattern. Electronically Signed   By: Kerby Moors M.D.   On: 11/26/2018 16:57    Pending Labs Unresulted Labs (From admission, onward)   None       Vitals/Pain Today's Vitals   12/08/2018  1533 12/21/2018 1534 12/17/2018 1545 12/17/2018 1600  BP:   (!) 162/72 (!) 158/73  Pulse:   (!) 54 (!) 51  Resp:   18 14  Temp:      TempSrc:      SpO2:   93% 92%  Weight:  82.5 kg    Height:  6\' 2"  (1.88 m)    PainSc: 0-No pain       Isolation Precautions No active isolations  Medications Medications  furosemide (LASIX) injection 20 mg (20 mg Intravenous Given 12/19/2018 1726)    Mobility walks Low fall risk   Focused Assessments cardiac   R Recommendations: See Admitting Provider Note  Report given to:   Additional Notes: diuresis

## 2018-12-24 NOTE — H&P (Signed)
Date: 11/27/2018               Patient Name:  [name] [name] MRN: [id number]  DOB: January 07, 1930 Age / Sex: 85 y.o., male   PCP: Mosetta Anis, MD         Medical Service: Internal Medicine Teaching Service         Attending Physician: Dr. Sid Falcon, MD    First Contact: Dr. Sherry Ruffing Pager: [phone]  Second Contact: Dr. Philipp Ovens Pager: [phone]       After Hours (After 5p/  First Contact Pager: [phone]  weekends / holidays): Second Contact Pager: [phone]   Chief Complaint: Shortness of breath  History of Present Illness: This is a 85 year old male with a history of HFrEF (EF on 10/22 was 35%), CKD stage 4, HTN, and HLD who presented with a few week history of worsening shortness of breath.  He reports that he has been having increased fatigue and weakness that time as well.  He was recently started on oxygen reports that he does not feel like it works as well at home, he also has not been using it as much as he should be.  He only sleeps on 2 pillows at night and denies any PND. Son reports that he noticed patient's face was swelling, he has not been having any lower extremity edema. He denies any fevers, chills, nausea, chest pain, headaches, lightheadedness, dizziness or other issues.  He reports that he does eat some processed food and lots however tries to eat more potatoes and pains, also has been having a decreased and fluid intake to limit his fluid overload.   He was seen in clinic on 12/13/18 for worsening shortness of breath with exertion and that it was interfering with his daily activities. He did not seem fluid overloaded on exam that day. He had a CXR ordered however this was not completed. He was advised to continue fluid and salt restriction. He was also ordered 2L [place] oxygen at home. Weight at that time was 181 lbs.    In the ED he was noted to be afebrile, bradycardic to 50, tachypneic to 22, and hypertensive to 162/72. Labs were significant for K 5.3, Ct 3.27  (improved from his labs 1 year ago), WBC 2.9, Hgb 11 (close to his baseline), MCV 101.8. BNP elevated to 1,595, elevated around 1,300 1 and 2 years ago. CXR showed findings consistent with CHF. EKG showed sinus bradycardia, poor baseline, 1 PVC. He was given 20 mg lasix IV and admitted to internal medicine.    Meds:  Current Meds  Medication Sig  . amLODipine (NORVASC) 10 MG tablet Take 1 tablet (10 mg total) by mouth daily.  Marland Kitchen aspirin 81 MG EC tablet TAKE 1 TABLET BY MOUTH EVERY DAY (Patient taking differently: Take 81 mg by mouth daily. )  . brimonidine (ALPHAGAN) 0.2 % ophthalmic solution Place 1 drop into both eyes 2 (two) times daily.  . calcitRIOL (ROCALTROL) 0.25 MCG capsule Take 1 capsule (0.25 mcg total) by mouth daily.  Marland Kitchen doxazosin (CARDURA) 4 MG tablet Take 2 mg by mouth daily.  . Ferrous Sulfate (IRON PO) Take 1 tablet by mouth every evening.  . hydrALAZINE (APRESOLINE) 100 MG tablet TAKE 1 TABLET BY MOUTH 3 TIMES DAILY (Patient taking differently: Take 100 mg by mouth 3 (three) times daily. )  . isosorbide dinitrate (ISORDIL) 20 MG tablet TAKE 2 TABLETS BY MOUTH 3 TIMES DAILY (Patient taking differently: Take  40 mg by mouth 3 (three) times daily. )  . latanoprost (XALATAN) 0.005 % ophthalmic solution Place 1 drop into both eyes at bedtime.   . OXYGEN Inhale 1.5 L into the lungs as needed (shortness of breath).  . simvastatin (ZOCOR) 10 MG tablet TAKE 1 TABLET (10 MG TOTAL) BY MOUTH DAILY AT 6 PM. (Patient taking differently: Take 10 mg by mouth daily at 6 PM. )  . timolol (TIMOPTIC) 0.5 % ophthalmic solution Place 1 drop into both eyes daily.   Allergies: Allergies as of 12/22/2018  . (No Known Allergies)   Past Medical History:  Diagnosis Date  . CHF (congestive heart failure) ([place])   . Heart murmur   . History of gout ~ 1998  . Hyperlipidemia   . Hypertension   . Prostate cancer ([place]) ~ 1998   S/P radiation    Family History: Significant history of hypertension, no  history of heart failure or other cardiac issues.  Social History: Quit smoking 20 years ago, denies any alcohol or drug use.  He lives with his son.  He is to work at [employer].  Review of Systems: A complete ROS was negative except as per HPI.   Physical Exam: Blood pressure (!) 160/66, pulse (!) 50, temperature 97.7 F (36.5 C), temperature source Oral, resp. rate 18, height 6\' 2"  (1.88 m), weight 82.5 kg, SpO2 96 %. Physical Exam  Constitutional: He is oriented to person, place, and time and well-developed, well-nourished, and in no distress.  HENT:  Head: Normocephalic and atraumatic.  Eyes: Pupils are equal, round, and reactive to light. Conjunctivae and EOM are normal.  Neck: Normal range of motion. Neck supple. JVD (up to mandible) present. No thyromegaly present.  Cardiovascular: Normal rate and regular rhythm.  Pulmonary/Chest: Effort normal.  Bibasilar crackles  Abdominal: Soft. Bowel sounds are normal. He exhibits no distension.  Musculoskeletal: Normal range of motion.        General: Edema (1+ BL LE edema) present.  Neurological: He is alert and oriented to person, place, and time.  Skin: Skin is warm and dry. No erythema.  Psychiatric: Mood and affect normal.    EKG: personally reviewed my interpretation is sinus bradycardia, wandering baseline, 1 PVC, no acute STEMI  CXR: personally reviewed my interpretation is diffuse bilateral pulmonary edema  Assessment & Plan by Problem: Active Problems:   Acute HFrEF (heart failure with reduced ejection fraction) (HCC)  Acute hypoxic respiratory failure Heart failure exacerbation: This is a 85 year old male with a history of CHG (EF 35%), who presented with worsening shortness of breath for a few weeks.  He was recently seen in clinic and was started on oxygen, does not always use his oxygen at home. He also has not been sticking to his sodium restriction . He denied any chest pain, fevers, chills, recent sick contacts  or travel.  He was found to be hypoxic down to 76% by EMS and place on [place] 1.5L.  Exam he appears fluid overloaded with elevated JVD and bibasilar crackles.  Labs significant for BNP 1595, WBC 2.9, microcytic anemia(close to his baseline).  Appears to be a CHF exacerbation at may be secondary to his dietary indiscretion indiscretion.  Also may need to be on a diuretic as needed.  Received 20 mg IV lasix in the ED.  -Strict I+Os -Daily weights -CBC and BMP in AM -Lasix 40 mg IV -Repeat echocardiogram -Telemetry -Repeat EKG -Heart healthy diet with fluid restriction -May consider lasix PRN  on discharge  Hypertension: -He is on amlodipine 10 mg daily, hydralazine 100 mg 3 times daily, isosorbide dinitrate 20 mg 3 times daily at home -Restart home medications  Hyperkalemia: K 5.3 on admission, no EKG changes.  -Diuresing with lasix, continue to monitor for now -Recheck BMP in AM  CKD stage 4: -Cr not elevated from his baseline. Continue to monitor and recheck labs. No need for urgent dialysis, will continue to monitor.  -Daily BMP  BPH: -Continue cardura, continue to monitor fluid output  HLD: -Contninue home simvastatin  Microcytic anemia: -Continue ferrous sulfate 325 daily -Daily CBC -Transfuse if hgb <7  FEN: No fluids, replete lytes prn, renal diet VTE ppx: Lovenox  Code Status: FULL    Dispo: Admit patient to Observation with expected length of stay less than 2 midnights.  Signed: Asencion Noble, MD 12/22/2018, 6:25 PM  Pager: [phone]

## 2018-12-24 NOTE — ED Triage Notes (Signed)
Pt arrives to ED with complaints of shortness of breath; pt ws 76% on room air with fire, 99% on non-rebreather. Pt 90% on room air upon arrival. Pt has hx of CHF but is controlled. Pt states he has been feeling more tired than usual as well.

## 2018-12-24 NOTE — ED Notes (Signed)
ED Provider at bedside. 

## 2018-12-24 NOTE — ED Provider Notes (Signed)
[facility] EMERGENCY DEPARTMENT Provider Note   CSN: [id number] Arrival date & time: 12/05/2018  1524    History   Chief Complaint Chief Complaint  Patient presents with  . Shortness of Breath    HPI [name] [name] is a 85 y.o. male with history of systolic heart failure EF 35%, advanced kidney disease, hypertension, anemia, supposed to be on respiratory therapy is here for evaluation of shortness of breath and hypoxemia.  Per triage note, patient was found to be hypoxic at 76% on room air, unclear if he had supplemental oxygen via nasal cannula like he supposed to.  Patient reports over the last 2 weeks he has had increased fatigue, decreased activity, decreased urine output as well as increased exertional shortness of breath that acutely worsened today.  States he uses his nasal cannula at 1.5 L "every now and then" and mostly at night.  In the last week he has used it 3-4 times.  He feels like when he hooked himself up on the oxygen it does nothing for him and it is not helping him.  Reports chills that he states is not new.  Was told to be on fluid restriction but does not always follow this.  He is not on any diuretics.  No recent weight check.  No orthopnea, sleeps on his sides without any changes recently.  No lower extremity swelling.  He denies any fevers, chest pain, cough, cold symptoms, abdominal pain, vomiting, diarrhea.  No dysuria.  In ER he is on 1.5 L [place] at 90-92%. Pt feels like his breathing is back to normal.   HPI  Past Medical History:  Diagnosis Date  . CHF (congestive heart failure) ([place])   . Heart murmur   . History of gout ~ 1998  . Hyperlipidemia   . Hypertension   . Prostate cancer ([place]) ~ 1998   S/P radiation    Patient Active Problem List   Diagnosis Date Noted  . Chronic dyspnea 12/13/2018  . Ectatic abdominal aorta ([place]) 08/25/2017  . Abdominal aortic atherosclerosis ([place]) 08/25/2017  . Secondary hyperparathyroidism of renal  origin ([place]) 08/25/2017  . Chronic HFrEF (heart failure with reduced ejection fraction) ([place]) 08/17/2017  . Weight loss 02/24/2017  . Vitamin D deficiency 02/24/2017  . Advanced care planning/counseling discussion 02/24/2017  . Chronic kidney disease (CKD), stage IV (severe) ([place]) 04/14/2013  . Essential hypertension 10/27/2012  . Hyperlipidemia 10/27/2012  . History of prostate cancer 10/27/2012  . Preventative health care 10/27/2012    Past Surgical History:  Procedure Laterality Date  . CATARACT EXTRACTION W/ INTRAOCULAR LENS  IMPLANT, BILATERAL Bilateral   . CYSTOSCOPY W/ URETERAL STENT PLACEMENT Left 08/21/2017   Procedure: CYSTOSCOPY WITH RETROGRADE PYELOGRAM/URETERAL JJ STENT PLACEMENT;  Surgeon: Ceasar Mons, MD;  Location: [facility];  Service: Urology;  Laterality: Left;        Home Medications    Prior to Admission medications   Medication Sig Start Date End Date Taking? Authorizing Provider  amLODipine (NORVASC) 10 MG tablet Take 1 tablet (10 mg total) by mouth daily. 08/01/18   Kathi Ludwig, MD  aspirin 81 MG EC tablet TAKE 1 TABLET BY MOUTH EVERY DAY 09/16/18   Mosetta Anis, MD  brimonidine [facility]) 0.2 % ophthalmic solution Place 1 drop into both eyes 2 (two) times daily. 07/07/17   [provider]  calcitRIOL (ROCALTROL) 0.25 MCG capsule Take 1 capsule (0.25 mcg total) by mouth daily. 09/29/18   Mosetta Anis, MD  doxazosin (CARDURA) 2 MG tablet Take 1 tablet (2 mg total) by mouth daily. 06/29/18   Bensimhon, Shaune Pascal, MD  hydrALAZINE (APRESOLINE) 100 MG tablet TAKE 1 TABLET BY MOUTH 3 TIMES DAILY 10/18/18   Bensimhon, Shaune Pascal, MD  isosorbide dinitrate (ISORDIL) 20 MG tablet TAKE 2 TABLETS BY MOUTH 3 TIMES DAILY 10/20/18   Clegg, Amy D, NP  latanoprost (XALATAN) 0.005 % ophthalmic solution Place 1 drop into both eyes at bedtime.     [provider]  simvastatin (ZOCOR) 10 MG tablet TAKE 1 TABLET (10 MG TOTAL) BY MOUTH DAILY AT 6 PM.  08/17/18   Mosetta Anis, MD  timolol (TIMOPTIC) 0.5 % ophthalmic solution Place 1 drop into both eyes daily. 06/02/17   [provider]    Family History Family History  Problem Relation Age of Onset  . Hypertension Father   . Hypertension Sister   . Hypertension Brother     Social History Social History   Tobacco Use  . Smoking status: Former Smoker    Packs/day: 1.00    Years: 46.00    Pack years: 46.00    Types: Cigarettes    Last attempt to quit: 1998    Years since quitting: 22.1  . Smokeless tobacco: Never Used  Substance Use Topics  . Alcohol use: No    Alcohol/week: 0.0 standard drinks  . Drug use: No     Allergies   Patient has no known allergies.   Review of Systems Review of Systems  Constitutional: Positive for chills.  Respiratory: Positive for shortness of breath.   Cardiovascular: Positive for leg swelling.  Genitourinary: Positive for decreased urine volume.  Neurological: Positive for weakness (generalized).  All other systems reviewed and are negative.    Physical Exam Updated Vital Signs BP (!) 158/73   Pulse (!) 51   Temp 97.7 F (36.5 C) (Oral)   Resp 14   Ht 6\' 2"  (1.88 m)   Wt 82.5 kg   SpO2 92%   BMI 23.34 kg/m   Physical Exam Vitals signs and nursing note reviewed.  Constitutional:      General: He is not in acute distress.    Appearance: He is well-developed.     Comments: NAD.  Nontoxic.  On 1.5 L nasal cannula.  HENT:     Head: Normocephalic and atraumatic.     Right Ear: External ear normal.     Left Ear: External ear normal.     Nose: Nose normal.  Eyes:     General: No scleral icterus.    Conjunctiva/sclera: Conjunctivae normal.  Neck:     Musculoskeletal: Normal range of motion and neck supple.  Cardiovascular:     Rate and Rhythm: Normal rate and regular rhythm.     Heart sounds: Normal heart sounds. No murmur.     Comments: 1+ pitting edema localized to posterior ankles bilaterally.  No calf  tenderness.  No asymmetric lower extremity edema.  1+ radial and DP pulses bilaterally. Pulmonary:     Effort: Pulmonary effort is normal.     Breath sounds: Examination of the right-middle field reveals decreased breath sounds. Examination of the left-middle field reveals decreased breath sounds. Examination of the right-lower field reveals decreased breath sounds. Examination of the left-lower field reveals decreased breath sounds. Decreased breath sounds present.     Comments: Faint air entry in upper lobes.  Diminished air entry otherwise, worse on the left.  Speaking in full sentences.  No respiratory distress.  On supplemental oxygen.  No wheezing or crackles. Abdominal:     Palpations: Abdomen is soft.     Tenderness: There is no abdominal tenderness.     Comments: No obvious distention to abdomen.  No suprapubic or CVA tenderness.  Musculoskeletal: Normal range of motion.        General: No deformity.  Skin:    General: Skin is warm and dry.     Capillary Refill: Capillary refill takes less than 2 seconds.  Neurological:     Mental Status: He is alert and oriented to person, place, and time.  Psychiatric:        Behavior: Behavior normal.        Thought Content: Thought content normal.        Judgment: Judgment normal.      ED Treatments / Results  Labs (all labs ordered are listed, but only abnormal results are displayed) Labs Reviewed  BASIC METABOLIC PANEL - Abnormal; Notable for the following components:      Result Value   Potassium 5.3 (*)    Glucose, Bld 109 (*)    BUN 42 (*)    Creatinine, Ser 3.27 (*)    Calcium 8.6 (*)    GFR calc non Af Amer 16 (*)    GFR calc Af Amer 19 (*)    All other components within normal limits  CBC - Abnormal; Notable for the following components:   WBC 2.9 (*)    RBC 3.84 (*)    Hemoglobin 11.7 (*)    MCV 101.8 (*)    MCHC 29.9 (*)    All other components within normal limits  BRAIN NATRIURETIC PEPTIDE - Abnormal; Notable for  the following components:   B Natriuretic Peptide 1,595.7 (*)    All other components within normal limits  HEPATIC FUNCTION PANEL - Abnormal; Notable for the following components:   Bilirubin, Direct 0.4 (*)    All other components within normal limits  URINALYSIS, ROUTINE W REFLEX MICROSCOPIC - Abnormal; Notable for the following components:   Protein, ur 30 (*)    All other components within normal limits  I-STAT TROPONIN, ED    EKG EKG Interpretation  Date/Time:  Saturday December 24 2018 15:31:58 EST Ventricular Rate:  52 PR Interval:    QRS Duration: 162 QT Interval:  498 QTC Calculation: 464 R Axis:   18 Text Interpretation:  Sinus rhythm Ventricular premature complex Prolonged PR interval LVH with secondary repolarization abnormality Anterior Q waves, possibly due to LVH poor baseline, similar to prior 1/19 Confirmed by Aletta Edouard [phone]) on 12/20/2018 3:37:49 PM   Radiology Dg Chest 2 View  Result Date: 12/15/2018 CLINICAL DATA:  Chest pain and shortness of breath for 2 weeks. EXAM: CHEST - 2 VIEW COMPARISON:  11/04/2017 FINDINGS: Increase in cardiac enlargement. Bilateral pleural effusions are identified which are also increased in the interval. Diffuse bilateral hazy lung opacities compatible with moderate pulmonary edema. IMPRESSION: 1. Moderate CHF pattern. Electronically Signed   By: Kerby Moors M.D.   On: 12/05/2018 16:57    Procedures Procedures (including critical care time)  Medications Ordered in ED Medications  furosemide (LASIX) injection 20 mg (has no administration in time range)     Initial Impression / Assessment and Plan / ED Course  I have reviewed the triage vital signs and the nursing notes.  Pertinent labs & imaging results that were available during my care of the patient were reviewed by me and considered in my medical decision  making (see chart for details).  Clinical Course as of Feb 29 1725  Sat Dec 24, 2018  1631 baseline    Creatinine(!): 3.27 [CG]  2366 85 year old male with history of CHF and renal insufficiency here with increased shortness of breath over the course of about a month.  Today called 911 and they found him off of oxygen with a low sat.  He improved with putting him back on oxygen.  Is currently no distress satting well on a liter and a half.  Waiting on chest x-ray labs for disposition.   [MB]  9381 B Natriuretic Peptide(!): 1,595.7 [CG]  8299 FINDINGS: Increase in cardiac enlargement. Bilateral pleural effusions are identified which are also increased in the interval. Diffuse bilateral hazy lung opacities compatible with moderate pulmonary edema.  IMPRESSION: 1. Moderate CHF pattern.  DG Chest 2 View [CG]    Clinical Course User Index [CG] Kinnie Feil, PA-C [MB] Hayden Rasmussen, MD       Highest on ddx HF exacerbation.  Less likely COPD exacerbation, PNA. He has no CP and ACS is unlikely, sxs chronic for over 2 weeks.  He has no risk factors for PE, asymmetric LE edema, and I have lower suspicion for PE.   1720: Work-up suggest worsening CHF.  Renal function at baseline.  Anemia at baseline.  Respiratory status is stable on his home oxygen.  Patient is not on diuretics.  It seems like he is only on blood pressure management due to chronic comorbidities.  Given worsening effusions, we will give small dose of Lasix here.  May need interventions for effusions.  Will speak to hospitalist for admission of acute HF exacerbation.  Given age, comorbidities he may benefit from palliative and cards consult.  Seen by Dr Haroldine Laws 1 year ago. Discussed with EDMD.  Final Clinical Impressions(s) / ED Diagnoses   Final diagnoses:  Acute on chronic systolic congestive heart failure [facility])    ED Discharge Orders    None       Kinnie Feil, PA-C 11/30/2018 1725    Hayden Rasmussen, MD 12/25/18 [phone]

## 2018-12-24 NOTE — ED Notes (Signed)
Lab to add on hepatic function panel 

## 2018-12-25 ENCOUNTER — Observation Stay (HOSPITAL_COMMUNITY): Payer: Medicare Other

## 2018-12-25 DIAGNOSIS — N2581 Secondary hyperparathyroidism of renal origin: Secondary | ICD-10-CM | POA: Diagnosis present

## 2018-12-25 DIAGNOSIS — I361 Nonrheumatic tricuspid (valve) insufficiency: Secondary | ICD-10-CM | POA: Diagnosis not present

## 2018-12-25 DIAGNOSIS — J9601 Acute respiratory failure with hypoxia: Secondary | ICD-10-CM | POA: Diagnosis present

## 2018-12-25 DIAGNOSIS — Z7982 Long term (current) use of aspirin: Secondary | ICD-10-CM | POA: Diagnosis not present

## 2018-12-25 DIAGNOSIS — I340 Nonrheumatic mitral (valve) insufficiency: Secondary | ICD-10-CM | POA: Diagnosis not present

## 2018-12-25 DIAGNOSIS — N400 Enlarged prostate without lower urinary tract symptoms: Secondary | ICD-10-CM | POA: Diagnosis present

## 2018-12-25 DIAGNOSIS — Z79899 Other long term (current) drug therapy: Secondary | ICD-10-CM

## 2018-12-25 DIAGNOSIS — D631 Anemia in chronic kidney disease: Secondary | ICD-10-CM | POA: Diagnosis present

## 2018-12-25 DIAGNOSIS — G9340 Encephalopathy, unspecified: Secondary | ICD-10-CM | POA: Diagnosis present

## 2018-12-25 DIAGNOSIS — I5043 Acute on chronic combined systolic (congestive) and diastolic (congestive) heart failure: Secondary | ICD-10-CM | POA: Diagnosis present

## 2018-12-25 DIAGNOSIS — M109 Gout, unspecified: Secondary | ICD-10-CM | POA: Diagnosis present

## 2018-12-25 DIAGNOSIS — R34 Anuria and oliguria: Secondary | ICD-10-CM | POA: Diagnosis not present

## 2018-12-25 DIAGNOSIS — N184 Chronic kidney disease, stage 4 (severe): Secondary | ICD-10-CM

## 2018-12-25 DIAGNOSIS — I130 Hypertensive heart and chronic kidney disease with heart failure and stage 1 through stage 4 chronic kidney disease, or unspecified chronic kidney disease: Secondary | ICD-10-CM | POA: Diagnosis present

## 2018-12-25 DIAGNOSIS — Z8546 Personal history of malignant neoplasm of prostate: Secondary | ICD-10-CM | POA: Diagnosis not present

## 2018-12-25 DIAGNOSIS — I447 Left bundle-branch block, unspecified: Secondary | ICD-10-CM

## 2018-12-25 DIAGNOSIS — E875 Hyperkalemia: Secondary | ICD-10-CM

## 2018-12-25 DIAGNOSIS — I959 Hypotension, unspecified: Secondary | ICD-10-CM | POA: Diagnosis not present

## 2018-12-25 DIAGNOSIS — R011 Cardiac murmur, unspecified: Secondary | ICD-10-CM | POA: Diagnosis present

## 2018-12-25 DIAGNOSIS — Z87891 Personal history of nicotine dependence: Secondary | ICD-10-CM | POA: Diagnosis not present

## 2018-12-25 DIAGNOSIS — Z8249 Family history of ischemic heart disease and other diseases of the circulatory system: Secondary | ICD-10-CM | POA: Diagnosis not present

## 2018-12-25 DIAGNOSIS — E785 Hyperlipidemia, unspecified: Secondary | ICD-10-CM | POA: Diagnosis present

## 2018-12-25 DIAGNOSIS — Z9981 Dependence on supplemental oxygen: Secondary | ICD-10-CM | POA: Diagnosis not present

## 2018-12-25 DIAGNOSIS — Z66 Do not resuscitate: Secondary | ICD-10-CM | POA: Diagnosis not present

## 2018-12-25 DIAGNOSIS — Z515 Encounter for palliative care: Secondary | ICD-10-CM | POA: Diagnosis not present

## 2018-12-25 DIAGNOSIS — N401 Enlarged prostate with lower urinary tract symptoms: Secondary | ICD-10-CM

## 2018-12-25 DIAGNOSIS — Z923 Personal history of irradiation: Secondary | ICD-10-CM | POA: Diagnosis not present

## 2018-12-25 DIAGNOSIS — N170 Acute kidney failure with tubular necrosis: Secondary | ICD-10-CM | POA: Diagnosis not present

## 2018-12-25 DIAGNOSIS — I5023 Acute on chronic systolic (congestive) heart failure: Secondary | ICD-10-CM | POA: Diagnosis present

## 2018-12-25 DIAGNOSIS — R001 Bradycardia, unspecified: Secondary | ICD-10-CM

## 2018-12-25 DIAGNOSIS — R338 Other retention of urine: Secondary | ICD-10-CM

## 2018-12-25 LAB — CBC
HCT: 37.9 % — ABNORMAL LOW (ref 39.0–52.0)
Hemoglobin: 11.2 g/dL — ABNORMAL LOW (ref 13.0–17.0)
MCH: 30.7 pg (ref 26.0–34.0)
MCHC: 29.6 g/dL — ABNORMAL LOW (ref 30.0–36.0)
MCV: 103.8 fL — ABNORMAL HIGH (ref 80.0–100.0)
NRBC: 0 % (ref 0.0–0.2)
Platelets: 148 10*3/uL — ABNORMAL LOW (ref 150–400)
RBC: 3.65 MIL/uL — ABNORMAL LOW (ref 4.22–5.81)
RDW: 13.2 % (ref 11.5–15.5)
WBC: 4 10*3/uL (ref 4.0–10.5)

## 2018-12-25 LAB — BASIC METABOLIC PANEL
Anion gap: 6 (ref 5–15)
BUN: 42 mg/dL — ABNORMAL HIGH (ref 8–23)
CO2: 29 mmol/L (ref 22–32)
CREATININE: 3.57 mg/dL — AB (ref 0.61–1.24)
Calcium: 8.6 mg/dL — ABNORMAL LOW (ref 8.9–10.3)
Chloride: 108 mmol/L (ref 98–111)
GFR calc Af Amer: 17 mL/min — ABNORMAL LOW (ref >60)
GFR calc non Af Amer: 14 mL/min — ABNORMAL LOW (ref >60)
Glucose, Bld: 103 mg/dL — ABNORMAL HIGH (ref 70–99)
Potassium: 4.8 mmol/L (ref 3.5–5.1)
Sodium: 143 mmol/L (ref 135–145)

## 2018-12-25 LAB — ECHOCARDIOGRAM COMPLETE
Height: 74 in
Weight: 2945.6 oz

## 2018-12-25 LAB — MAGNESIUM: Magnesium: 2.5 mg/dL — ABNORMAL HIGH (ref 1.7–2.4)

## 2018-12-25 MED ORDER — POLYETHYLENE GLYCOL 3350 17 G PO PACK: 17.0000 g | PACK | Freq: Every day | ORAL | Status: DC | PRN

## 2018-12-25 MED ORDER — FUROSEMIDE 10 MG/ML IJ SOLN
120.0000 mg | Freq: Once | INTRAVENOUS | Status: AC
  Administered 2018-12-25: 120 mg via INTRAVENOUS
  Filled 2018-12-25: qty 10

## 2018-12-25 MED ORDER — SENNA 8.6 MG PO TABS
2.0000 | ORAL_TABLET | Freq: Two times a day (BID) | ORAL | Status: DC | PRN
  Filled 2018-12-25: qty 2

## 2018-12-25 MED ORDER — GUAIFENESIN ER 600 MG PO TB12: 600.0000 mg | ORAL_TABLET | Freq: Two times a day (BID) | ORAL | Status: DC | PRN

## 2018-12-25 NOTE — Care Management Obs Status (Signed)
[facility] NOTIFICATION   Patient Details  Name: [name] [name] MRN: [id number] Date of Birth: 1930/07/17   Medicare Observation Status Notification Given:  Yes    Carles Collet, RN 12/25/2018, 1:58 PM

## 2018-12-25 NOTE — Progress Notes (Signed)
  Echocardiogram 2D Echocardiogram has been performed.  [name] [name] 12/25/2018, 3:20 PM

## 2018-12-25 NOTE — Progress Notes (Signed)
   Subjective: Mr. Mccadden reported feeling well this morning. He was seen sitting up eating breakfast this morning. He feels his breathing has improved. He reports he has not had much urine output. He said when he drinks less he does not urinate much and he has not been taking much PO intake.    Objective:  Vital signs in last 24 hours: Vitals:   12/03/2018 1800 12/16/2018 1823 12/25/18 0009 12/25/18 0439  BP: (!) 160/66 (!) 170/78 (!) 124/57 (!) 119/42  Pulse: (!) 50 (!) 58 (!) 50 (!) 53  Resp: 18 18 20 20   Temp:  97.7 F (36.5 C) (!) 97.4 F (36.3 C) (!) 97.5 F (36.4 C)  TempSrc:  Oral Oral Oral  SpO2: 96% 95% 95% 95%  Weight:  86.7 kg  83.5 kg  Height:  6\' 2"  (1.88 m)     Gen: seen comfortably laying in bed, no distress, on [place] Pulm: right sided crackles, end expiratory wheezing CV: bradycardic, no murmurs  Skin: warm and dry Ext: bilateral 1+ pitting edema  Psych: normal mood and affect  Assessment/Plan:  Active Problems:   Acute HFrEF (heart failure with reduced ejection fraction) ([place])  Mr. Bommarito is a 85 year old male with a history of CHG (EF 35%), who presented with worsening shortness of breath for a few weeks.  He was recently seen in clinic and was started on oxygen, does not always use his oxygen at home. He also has not been sticking to his sodium restriction. He was found to be hypoxic down to 76% by EMS and place on [place] 1.5L.     Acute hypoxic respiratory failure Heart failure exacerbation: -Output 350 ml, bladder scan showed 0 ml -Weight 184  -Given his low urinary output will increase his furosemide 120 mg IV once and reassess -Strict I+Os -Daily weights -Trend bmp -Follow up echocardiogram -Telemetry -Repeat EKG  Bradycardia - Reviewed telemetry and paged by RN about HR being as low as 38 - He denies chest pain, SOB. Reports some sleepiness which he reports is his baseline - Follow up repeat EKG  - Follow up echo - Follow up mag - BMP am -  Continue telemetry   Hypertension: -119/42 - Will hold isosorbide and hydralazine given his softer pressures  - Continue Cardura   Hyperkalemia: - K 4.8 - Diuresing with lasix, continue to monitor for now - Recheck BMP in AM  CKD stage 4: -Daily BMP  BPH: -Decreased urinary output, 350 ml, bladder scan 0 ml -Continue cardura, continue to monitor fluid output   Dispo: Anticipated discharge pending clinical improvement.   Mike Craze, DO 12/25/2018, 6:27 AM Pager: [phone]

## 2018-12-25 NOTE — Evaluation (Signed)
Physical Therapy Evaluation Patient Details Name: [name] [name] MRN: [id number] DOB: 05-18-1930 Today's Date: 12/25/2018   History of Present Illness  Pt is a 85 y.o. M with significant PMH of HFrEF, CKD stage 4, HTN, who presents with a few week history of worsening shortness of breath. Admitted with working diagnosis of heart failure.  Clinical Impression  Pt admitted with above. Prior to admission, pt lives with his son and states he is independent with ADL's/mobility. Pt presenting with decreased mobility secondary to balance impairments, pulmonary issues, and decreased cognition. Ambulating 150 feet with no assistive device and min assist to stabilize due to significant dynamic balance deficits. Pt presenting as high risk for falls based on above, history of falls, and decreased safety awareness. Would highly recommend walker for all mobility in addition to 24 hour supervision. SpO2 88% on 2L O2. Will follow acutely to progress mobility.     Follow Up Recommendations Home health PT;Supervision/Assistance - 24 hour (if can't get 24 hr, may need SNF)    Equipment Recommendations  Rolling walker with 5" wheels    Recommendations for Other Services       Precautions / Restrictions Precautions Precautions: Fall Restrictions Weight Bearing Restrictions: No      Mobility  Bed Mobility Overal bed mobility: Needs Assistance Bed Mobility: Supine to Sit     Supine to sit: Supervision     General bed mobility comments: supervision for safety  Transfers Overall transfer level: Needs assistance Equipment used: None Transfers: Sit to/from Stand Sit to Stand: Min guard            Ambulation/Gait Ambulation/Gait assistance: Min assist Gait Distance (Feet): 150 Feet Assistive device: None Gait Pattern/deviations: Step-through pattern;Decreased dorsiflexion - right;Decreased dorsiflexion - left;Trunk flexed     General Gait Details: Pt requiring min assist due to  dynamic instability, especially with turns. Cues for slowing down, picking feet up, and upright posture. Pt not able to correct  Stairs            Wheelchair Mobility    Modified Rankin (Stroke Patients Only)       Balance Overall balance assessment: Needs assistance Sitting-balance support: Feet supported Sitting balance-Leahy Scale: Good     Standing balance support: No upper extremity supported Standing balance-Leahy Scale: Fair                               Pertinent Vitals/Pain Pain Assessment: No/denies pain    Home Living Family/patient expects to be discharged to:: Private residence Living Arrangements: Children(son) Available Help at Discharge: Family Type of Home: House Home Access: Level entry     Home Layout: One level Home Equipment: None      Prior Function Level of Independence: Independent               Hand Dominance        Extremity/Trunk Assessment   Upper Extremity Assessment Upper Extremity Assessment: Overall WFL for tasks assessed    Lower Extremity Assessment Lower Extremity Assessment: Overall WFL for tasks assessed    Cervical / Trunk Assessment Cervical / Trunk Assessment: Kyphotic  Communication   Communication: Expressive difficulties(difficult to understand at times)  Cognition Arousal/Alertness: Lethargic Behavior During Therapy: Flat affect Overall Cognitive Status: No family/caregiver present to determine baseline cognitive functioning  General Comments: Pt drowsy throughout session with slow processing and decreased awareness of deficits      General Comments      Exercises     Assessment/Plan    PT Assessment Patient needs continued PT services  PT Problem List Decreased balance;Decreased mobility;Cardiopulmonary status limiting activity;Decreased cognition;Decreased safety awareness       PT Treatment Interventions DME instruction;Gait  training;Functional mobility training;Therapeutic activities;Therapeutic exercise;Balance training;Patient/family education    PT Goals (Current goals can be found in the Care Plan section)  Acute Rehab PT Goals Patient Stated Goal: none stated; agreeable to therapy PT Goal Formulation: With patient Time For Goal Achievement: 01/08/19 Potential to Achieve Goals: Good    Frequency Min 3X/week   Barriers to discharge        Co-evaluation               AM-PAC PT "6 Clicks" Mobility  Outcome Measure Help needed turning from your back to your side while in a flat bed without using bedrails?: None Help needed moving from lying on your back to sitting on the side of a flat bed without using bedrails?: None Help needed moving to and from a bed to a chair (including a wheelchair)?: A Little Help needed standing up from a chair using your arms (e.g., wheelchair or bedside chair)?: A Little Help needed to walk in hospital room?: A Little Help needed climbing 3-5 steps with a railing? : A Lot 6 Click Score: 19    End of Session Equipment Utilized During Treatment: Gait belt;Oxygen Activity Tolerance: Patient tolerated treatment well Patient left: in chair;with call bell/phone within reach;with chair alarm set Nurse Communication: Mobility status PT Visit Diagnosis: Unsteadiness on feet (R26.81);Difficulty in walking, not elsewhere classified (R26.2)    Time: 0762-2633 PT Time Calculation (min) (ACUTE ONLY): 26 min   Charges:   PT Evaluation $PT Eval Moderate Complexity: 1 Mod PT Treatments $Gait Training: 8-22 mins      Ellamae Sia, PT, DPT Acute Rehabilitation Services Pager [phone] Office [phone]   Willy Eddy 12/25/2018, 10:04 AM

## 2018-12-25 NOTE — Progress Notes (Signed)
CCMD called- HR dropped down to 38.   Assessed patient-  Apical pulse is 42 bpm.  Pt not having any CP, further SOB,

## 2018-12-25 NOTE — Progress Notes (Signed)
  Date: 12/25/2018  Patient name: [name] [name]  Medical record number: [id number]  Date of birth: October 02, 1930   I have seen and evaluated Myra Rude and discussed their care with the Residency Team. Briefly, Mr. Fiorella is an 85 year old man with PMH of HFrEF (EF of 35% per most recent TTE) who presented with progressive SOB.  He has oxygen at home, but does not use it much, and is not sure if it is working.  He has only been taking some of his blood pressure medications.  He was last seen by Cardiology about 1 year ago and has not seen them since, though he was due to follow up in 6 months.  He has a high salt diet.  His breathing is mildly improved this morning, however, he has not been diuresing well with lasix.    Vitals:   12/25/18 0727 12/25/18 1009  BP: (!) 130/57 127/63  Pulse: (!) 49 (!) 53  Resp: 18   Temp: 97.7 F (36.5 C)   SpO2: 95% 96%   General: Lying in bed, No acute distress Eyes: Anicteric Neck: JVD not assess, sitting up eating breakfast CV: Bradycardic, no murmur Pulm: Decreased breath sounds on the right throughout, crackles to mid lung on the right.  Very faint wheezing at the end of expiration.  Abd: Distended but soft, +BS, NT Ext: 1+ edema to knees bilaterally  EKG is difficult to interpret, likely unchanged, bradycardic, wandering baseline, LBBB.  Would repeat  CXR With diffuse pulmonary edema, worse on the right.   Assessment and Plan: I have seen and evaluated the patient as outlined above. I agree with the formulated Assessment and Plan as detailed in the residents' note, with the following changes:   1. HFrEF, acute on chronic exacerbation with presumed EF around 35 - 45% based on review of chart - Related to HTN, medication adherence and high salt diet - Daily weights, strict I/O - Not diuresing well with lasix, check bladder scan and place foley if retaining - Consider changing to bumex for better diuresis - Restart  isosorbide/hydral for afterload reduction - Not on a beta blocker 2/2 bradycardia - Repeat TTE - Telemetry  2. Hyperkalemia - Improved with lasix - Renal function daily  3. CKD - Renal function worsened today, likely related to diuresis - If Cr continues to worsen and poor urine output with diuresis, consider cardiorenal syndrome.  He may need a nephrology consult for possible ultrafiltration if not able to diuresis otherwise  Other issues per Dr. Olevia Perches daily note.   Sid Falcon, MD 3/1/202010:35 AM

## 2018-12-25 NOTE — Progress Notes (Signed)
Bladder scan performed-    0 mls

## 2018-12-25 NOTE — Progress Notes (Signed)
Check on patient- no measurable urine.  Pt urinated in bed.  Will apply a condom catheter to measure urine  Did another bladder scan- 182 ml

## 2018-12-25 DEATH — deceased

## 2018-12-26 ENCOUNTER — Inpatient Hospital Stay (HOSPITAL_COMMUNITY): Payer: Medicare Other

## 2018-12-26 DIAGNOSIS — Z9111 Patient's noncompliance with dietary regimen: Secondary | ICD-10-CM

## 2018-12-26 DIAGNOSIS — Z9981 Dependence on supplemental oxygen: Secondary | ICD-10-CM

## 2018-12-26 DIAGNOSIS — Z9119 Patient's noncompliance with other medical treatment and regimen: Secondary | ICD-10-CM

## 2018-12-26 DIAGNOSIS — N179 Acute kidney failure, unspecified: Secondary | ICD-10-CM

## 2018-12-26 DIAGNOSIS — Z960 Presence of urogenital implants: Secondary | ICD-10-CM

## 2018-12-26 DIAGNOSIS — N39498 Other specified urinary incontinence: Secondary | ICD-10-CM

## 2018-12-26 DIAGNOSIS — N281 Cyst of kidney, acquired: Secondary | ICD-10-CM

## 2018-12-26 LAB — BASIC METABOLIC PANEL
Anion gap: 7 (ref 5–15)
BUN: 49 mg/dL — ABNORMAL HIGH (ref 8–23)
CO2: 27 mmol/L (ref 22–32)
CREATININE: 4.56 mg/dL — AB (ref 0.61–1.24)
Calcium: 8.4 mg/dL — ABNORMAL LOW (ref 8.9–10.3)
Chloride: 106 mmol/L (ref 98–111)
GFR calc Af Amer: 12 mL/min — ABNORMAL LOW (ref >60)
GFR calc non Af Amer: 11 mL/min — ABNORMAL LOW (ref >60)
Glucose, Bld: 127 mg/dL — ABNORMAL HIGH (ref 70–99)
Potassium: 5.3 mmol/L — ABNORMAL HIGH (ref 3.5–5.1)
Sodium: 140 mmol/L (ref 135–145)

## 2018-12-26 MED ORDER — SODIUM CHLORIDE 0.9 % IV SOLN
INTRAVENOUS | Status: AC
  Administered 2018-12-26: 16:00:00 via INTRAVENOUS

## 2018-12-26 MED ORDER — SODIUM ZIRCONIUM CYCLOSILICATE 10 G PO PACK
10.0000 g | PACK | Freq: Once | ORAL | Status: AC
  Administered 2018-12-26: 10 g via ORAL
  Filled 2018-12-26 (×2): qty 1

## 2018-12-26 NOTE — Consult Note (Signed)
[facility] Nephrology Consultation Note  Requesting MD: Dr Lalla Brothers Reason for consult: CKD  HPI:  [name] [name] is a 85 y.o. male.  With history of CHF, hypertension, HLD, prostate cancer, CKD stage IV with baseline creatinine around 3.5-4, admitted on 2/29 for CHF exacerbation.  Patient was not on Lasix and home medication list.  He was treated with IV Lasix with gradually worsening serum creatinine level.  The creatinine level was 3.2 on admission and gradually increase to 4.56.  He received Lasix 120 mg IV yesterday.  The chest x-ray on admission with moderate CHF pattern.  Repeat echocardiogram done yesterday with improved to 40-45 %.  Today, he was looking dry on physical exam therefore diuretics were held.  Urine output is only recorded for 100 cc in 24 hours.  Patient looks comfortable and has no lower extremity edema.  No recent IV contrast. He is not on ACEI or ARB.  Blood pressure level borderline low.   He denied nausea, vomiting, headache, dizziness, chest pain, shortness of breath.  He is alert awake and oriented to himself and hospital only.  Of note, patient had calculi in left kidney and requiring a stent in 2018 when that peaked serum creatinine level of 7.65.  Since then he is creatinine level remains between 3-4.   Creat  Date/Time Value Ref Range Status  07/03/2014 02:09 PM 1.54 (H) 0.50 - 1.35 mg/dL Final  09/05/2013 03:30 PM 1.56 (H) 0.50 - 1.35 mg/dL Final  04/17/2013 09:09 AM 1.53 (H) 0.50 - 1.35 mg/dL Final  04/04/2013 02:04 PM 1.52 (H) 0.50 - 1.35 mg/dL Final  10/27/2012 11:21 AM 1.31 0.50 - 1.35 mg/dL Final   Creatinine, Ser  Date/Time Value Ref Range Status  12/26/2018 04:22 AM 4.56 (H) 0.61 - 1.24 mg/dL Final  12/25/2018 04:04 AM 3.57 (H) 0.61 - 1.24 mg/dL Final  11/30/2018 03:44 PM 3.27 (H) 0.61 - 1.24 mg/dL Final  12/01/2017 11:21 AM 4.12 (H) 0.61 - 1.24 mg/dL Final  11/04/2017 06:29 AM 3.45 (H) 0.61 - 1.24 mg/dL Final   08/27/2017 03:21 PM 5.84 (H) 0.76 - 1.27 mg/dL Final  08/25/2017 03:16 AM 6.34 (H) 0.61 - 1.24 mg/dL Final  08/24/2017 03:29 AM 7.17 (H) 0.61 - 1.24 mg/dL Final  08/23/2017 03:39 AM 7.65 (H) 0.61 - 1.24 mg/dL Final  08/22/2017 03:08 AM 7.41 (H) 0.61 - 1.24 mg/dL Final  08/21/2017 05:16 AM 6.44 (H) 0.61 - 1.24 mg/dL Final  08/20/2017 03:27 AM 5.62 (H) 0.61 - 1.24 mg/dL Final  08/19/2017 02:47 AM 4.20 (H) 0.61 - 1.24 mg/dL Final  08/18/2017 03:14 AM 3.55 (H) 0.61 - 1.24 mg/dL Final  08/17/2017 08:17 AM 3.15 (H) 0.61 - 1.24 mg/dL Final  08/16/2017 07:03 AM 2.79 (H) 0.61 - 1.24 mg/dL Final  08/16/2017 12:04 AM 2.92 (H) 0.61 - 1.24 mg/dL Final  02/23/2017 03:31 PM 2.06 (H) 0.76 - 1.27 mg/dL Final  07/30/2015 04:06 PM 1.40 (H) 0.76 - 1.27 mg/dL Final  09/15/2010 10:35 PM 1.39 0.40 - 1.50 mg/dL Final  06/05/2010 09:25 PM 1.41 0.40 - 1.50 mg/dL Final  08/01/2009 07:44 PM 1.39 0.40 - 1.50 mg/dL Final  04/22/2009 08:54 PM 1.83 (H) 0.40 - 1.50 mg/dL Final  03/20/2009 08:17 PM 1.51 (H) 0.40 - 1.50 mg/dL Final  07/28/2007 10:48 AM 1.4 0.4 - 1.5 mg/dL Final  07/20/2007 11:42 AM 1.4 0.4 - 1.5 mg/dL Final  07/15/2007 10:44 AM 1.5 0.4 - 1.5 mg/dL Final     PMHx:   Past Medical History:  Diagnosis Date  . CHF (congestive heart failure) ([place])   . Heart murmur   . History of gout ~ 1998  . Hyperlipidemia   . Hypertension   . Prostate cancer ([place]) ~ 1998   S/P radiation    Past Surgical History:  Procedure Laterality Date  . CATARACT EXTRACTION W/ INTRAOCULAR LENS  IMPLANT, BILATERAL Bilateral   . CYSTOSCOPY W/ URETERAL STENT PLACEMENT Left 08/21/2017   Procedure: CYSTOSCOPY WITH RETROGRADE PYELOGRAM/URETERAL JJ STENT PLACEMENT;  Surgeon: Ceasar Mons, MD;  Location: [facility];  Service: Urology;  Laterality: Left;    Family Hx:  Family History  Problem Relation Age of Onset  . Hypertension Father   . Hypertension Sister   . Hypertension Brother     Social History:  reports  that he quit smoking about 22 years ago. His smoking use included cigarettes. He has a 46.00 pack-year smoking history. He has never used smokeless tobacco. He reports that he does not drink alcohol or use drugs.  Allergies: No Known Allergies  Medications: Prior to Admission medications   Medication Sig Start Date End Date Taking? Authorizing Provider  amLODipine (NORVASC) 10 MG tablet Take 1 tablet (10 mg total) by mouth daily. 08/01/18  Yes Kathi Ludwig, MD  aspirin 81 MG EC tablet TAKE 1 TABLET BY MOUTH EVERY DAY Patient taking differently: Take 81 mg by mouth daily.  09/16/18  Yes Mosetta Anis, MD  brimonidine (ALPHAGAN) 0.2 % ophthalmic solution Place 1 drop into both eyes 2 (two) times daily. 07/07/17  Yes [provider]  calcitRIOL (ROCALTROL) 0.25 MCG capsule Take 1 capsule (0.25 mcg total) by mouth daily. 09/29/18  Yes Mosetta Anis, MD  doxazosin (CARDURA) 4 MG tablet Take 2 mg by mouth daily. 12/07/2018  Yes [provider]  Ferrous Sulfate (IRON PO) Take 1 tablet by mouth every evening.   Yes [provider]  hydrALAZINE (APRESOLINE) 100 MG tablet TAKE 1 TABLET BY MOUTH 3 TIMES DAILY Patient taking differently: Take 100 mg by mouth 3 (three) times daily.  10/18/18  Yes Bensimhon, Shaune Pascal, MD  isosorbide dinitrate (ISORDIL) 20 MG tablet TAKE 2 TABLETS BY MOUTH 3 TIMES DAILY Patient taking differently: Take 40 mg by mouth 3 (three) times daily.  10/20/18  Yes Clegg, Amy D, NP  latanoprost (XALATAN) 0.005 % ophthalmic solution Place 1 drop into both eyes at bedtime.    Yes [provider]  OXYGEN Inhale 1.5 L into the lungs as needed (shortness of breath).   Yes [provider]  simvastatin (ZOCOR) 10 MG tablet TAKE 1 TABLET (10 MG TOTAL) BY MOUTH DAILY AT 6 PM. Patient taking differently: Take 10 mg by mouth daily at 6 PM.  08/17/18  Yes Mosetta Anis, MD  timolol (TIMOPTIC) 0.5 % ophthalmic solution Place 1 drop into both eyes daily.  06/02/17  Yes [provider]  doxazosin (CARDURA) 2 MG tablet Take 1 tablet (2 mg total) by mouth daily. Patient not taking: Reported on 12/21/2018 06/29/18   Bensimhon, Shaune Pascal, MD    I have reviewed the patient's current medications.  Labs:  Results for orders placed or performed during the hospital encounter of 12/15/2018 (from the past 48 hour(s))  Basic metabolic panel     Status: Abnormal   Collection Time: 12/15/2018  3:44 PM  Result Value Ref Range   Sodium 141 135 - 145 mmol/L   Potassium 5.3 (H) 3.5 - 5.1 mmol/L   Chloride 108 98 - 111  mmol/L   CO2 27 22 - 32 mmol/L   Glucose, Bld 109 (H) 70 - 99 mg/dL   BUN 42 (H) 8 - 23 mg/dL   Creatinine, Ser 3.27 (H) 0.61 - 1.24 mg/dL   Calcium 8.6 (L) 8.9 - 10.3 mg/dL   GFR calc non Af Amer 16 (L) >60 mL/min   GFR calc Af Amer 19 (L) >60 mL/min   Anion gap 6 5 - 15    Comment: Performed at [facility] [address] [place], [place] [address]  CBC     Status: Abnormal   Collection Time: 12/16/2018  3:44 PM  Result Value Ref Range   WBC 2.9 (L) 4.0 - 10.5 K/uL   RBC 3.84 (L) 4.22 - 5.81 MIL/uL   Hemoglobin 11.7 (L) 13.0 - 17.0 g/dL   HCT 39.1 39.0 - 52.0 %   MCV 101.8 (H) 80.0 - 100.0 fL   MCH 30.5 26.0 - 34.0 pg   MCHC 29.9 (L) 30.0 - 36.0 g/dL   RDW 13.2 11.5 - 15.5 %   Platelets 156 150 - 400 K/uL   nRBC 0.0 0.0 - 0.2 %    Comment: Performed at [facility], [place] [address], [place], [place] [address]  Brain natriuretic peptide     Status: Abnormal   Collection Time: 11/30/2018  3:44 PM  Result Value Ref Range   B Natriuretic Peptide 1,595.7 (H) 0.0 - 100.0 pg/mL    Comment: Performed at [facility] [address], [place], [place] [address]  Hepatic function panel     Status: Abnormal   Collection Time: 12/20/2018  3:44 PM  Result Value Ref Range   Total Protein 7.0 6.5 - 8.1 g/dL   Albumin 3.5 3.5 - 5.0 g/dL   AST 27 15 - 41 U/L   ALT 14 0 - 44 U/L   Alkaline Phosphatase 47 38 - 126 U/L   Total  Bilirubin 1.0 0.3 - 1.2 mg/dL   Bilirubin, Direct 0.4 (H) 0.0 - 0.2 mg/dL   Indirect Bilirubin 0.6 0.3 - 0.9 mg/dL    Comment: Performed at [facility] [address] [place], [place] [address]  I-stat troponin, ED     Status: None   Collection Time: 12/06/2018  3:49 PM  Result Value Ref Range   Troponin i, poc 0.02 0.00 - 0.08 ng/mL   Comment 3            Comment: Due to the release kinetics of cTnI, a negative result within the first hours of the onset of symptoms does not rule out myocardial infarction with certainty. If myocardial infarction is still suspected, repeat the test at appropriate intervals.   Urinalysis, Routine w reflex microscopic     Status: Abnormal   Collection Time: 12/16/2018  5:04 PM  Result Value Ref Range   Color, Urine YELLOW YELLOW   APPearance CLEAR CLEAR   Specific Gravity, Urine 1.016 1.005 - 1.030   pH 5.0 5.0 - 8.0   Glucose, UA NEGATIVE NEGATIVE mg/dL   Hgb urine dipstick NEGATIVE NEGATIVE   Bilirubin Urine NEGATIVE NEGATIVE   Ketones, ur NEGATIVE NEGATIVE mg/dL   Protein, ur 30 (A) NEGATIVE mg/dL   Nitrite NEGATIVE NEGATIVE   Leukocytes,Ua NEGATIVE NEGATIVE   RBC / HPF 0-5 0 - 5 RBC/hpf   WBC, UA 0-5 0 - 5 WBC/hpf   Bacteria, UA NONE SEEN NONE SEEN   Squamous Epithelial / LPF 0-5 0 -  5   Mucus PRESENT    Hyaline Casts, UA PRESENT     Comment: Performed at [facility], [place] [address] [place], [place] [address]  Basic metabolic panel     Status: Abnormal   Collection Time: 12/25/18  4:04 AM  Result Value Ref Range   Sodium 143 135 - 145 mmol/L   Potassium 4.8 3.5 - 5.1 mmol/L   Chloride 108 98 - 111 mmol/L   CO2 29 22 - 32 mmol/L   Glucose, Bld 103 (H) 70 - 99 mg/dL   BUN 42 (H) 8 - 23 mg/dL   Creatinine, Ser 3.57 (H) 0.61 - 1.24 mg/dL   Calcium 8.6 (L) 8.9 - 10.3 mg/dL   GFR calc non Af Amer 14 (L) >60 mL/min   GFR calc Af Amer 17 (L) >60 mL/min   Anion gap 6 5 - 15    Comment: Performed at [facility] [address], [place], [place] [address]  CBC     Status: Abnormal   Collection Time: 12/25/18  4:04 AM  Result Value Ref Range   WBC 4.0 4.0 - 10.5 K/uL   RBC 3.65 (L) 4.22 - 5.81 MIL/uL   Hemoglobin 11.2 (L) 13.0 - 17.0 g/dL   HCT 37.9 (L) 39.0 - 52.0 %   MCV 103.8 (H) 80.0 - 100.0 fL   MCH 30.7 26.0 - 34.0 pg   MCHC 29.6 (L) 30.0 - 36.0 g/dL   RDW 13.2 11.5 - 15.5 %   Platelets 148 (L) 150 - 400 K/uL   nRBC 0.0 0.0 - 0.2 %    Comment: Performed at [facility], [place] [address], [place], [place] [address]  Magnesium     Status: Abnormal   Collection Time: 12/25/18  4:04 AM  Result Value Ref Range   Magnesium 2.5 (H) 1.7 - 2.4 mg/dL    Comment: Performed at [facility] [address] [place], [place] [address]  Basic metabolic panel     Status: Abnormal   Collection Time: 12/26/18  4:22 AM  Result Value Ref Range   Sodium 140 135 - 145 mmol/L   Potassium 5.3 (H) 3.5 - 5.1 mmol/L   Chloride 106 98 - 111 mmol/L   CO2 27 22 - 32 mmol/L   Glucose, Bld 127 (H) 70 - 99 mg/dL   BUN 49 (H) 8 - 23 mg/dL   Creatinine, Ser 4.56 (H) 0.61 - 1.24 mg/dL   Calcium 8.4 (L) 8.9 - 10.3 mg/dL   GFR calc non Af Amer 11 (L) >60 mL/min   GFR calc Af Amer 12 (L) >60 mL/min   Anion gap 7 5 - 15    Comment: Performed at [facility] [address] [place], [place] [address]     ROS:  Pertinent items noted in HPI and remainder of comprehensive ROS otherwise negative.  Physical Exam: Vitals:   12/26/18 0507 12/26/18 1251  BP: (!) 109/41 (!) 100/41  Pulse: (!) 47 (!) 43  Resp: 20   Temp: 97.8 F (36.6 C)   SpO2: 92% (!) 83%     General exam: Appears calm and comfortable, dry mucous membrane Respiratory system: Clear to auscultation. Respiratory effort normal. No wheezing or crackle Cardiovascular system: S1 & S2 heard, RRR.  No pedal edema.  No rubs Gastrointestinal system: Abdomen is nondistended, soft and nontender. Normal bowel sounds heard. Central nervous system: Alert,  awake and oriented to hospital and her  name only. Extremities: Symmetric 5 x 5 power. Skin: No rashes, lesions or ulcers  Assessment/Plan:  # CKD stageIV/V: Due to hypertension and CHF and may have some ischemic ATN due to relative hypotension.  The urinalysis is unremarkable and ultrasound kidneys with bilateral cortical thinning and complicated cyst in left kidney.  No evidence of hydronephrosis. -He looks dry on physical exam therefore agree with holding diuretics.  I will check urine sodium level.  He probably has low oral intake and currently blood pressure on the lower side.  Check repeat chest x-ray.  I will start short trial of IV fluid with close monitoring.  Continue Foley catheter, strict ins and outs.   -Discontinue doxazosin because of borderline low blood pressure -Given his functional status, age and comorbidities patient is marginal candidate for dialysis.  No urgent need for dialysis at this time. -Avoid IV contrast, NSAIDs.  #Mild hyperkalemia: I will order a dose of Lokelma.  #CHF exacerbation: Well compensated.  Echo reviewed.  #Hypertension: Blood pressure on the borderline low therefore hold antihypertensive medication.  #Secondary hyperparathyroidism: Currently on calcitriol.  Check phosphorus level.  Thank you for the consult, we will continue to follow with you.   Dron Tanna Furry 12/26/2018, 2:12 PM  [facility].

## 2018-12-26 NOTE — Care Management Note (Signed)
Case Management Note  Patient Details  Name: [name] [name] MRN: [id number] Date of Birth: [birth year]  Subjective/Objective:   CHF                Action/Plan: CM talked to patient at the bedside; he lives at home with his son; Mosetta Anis, MD; has private insurance with Medicare; pharmacy of choice is [facility] [address]; no DME; CM explained the benefits of [facility] services, pt refused all HHC at this time; CM will continue to follow for progression of care.  Expected Discharge Date:    possibly 12/31/2018              Expected Discharge Plan:  [place]  Discharge planning Services  CM Consult  Choice offered to:  Patient  HH Arranged:  Patient Refused HH  Status of Service:  In process, will continue to follow  Sherrilyn Rist [phone] 12/26/2018, 12:03 PM

## 2018-12-26 NOTE — Progress Notes (Signed)
Patient had foley catheter inserted by the SWOT RNs.  Scant yellow urine output in the foley bag.  IVF infusing.  Will continue to monitor urine output. Glynn Freas Loel Ro

## 2018-12-26 NOTE — Progress Notes (Signed)
Patient slept most of the shift.  Staff woke patient up for meals, patient refused lunch and dinner.  Marcille Blanco, RN

## 2018-12-26 NOTE — Progress Notes (Signed)
Internal Medicine Teaching Service Attending:   I saw and examined the patient. I reviewed the resident's note and I agree with the resident's findings and plan as documented in the resident's note.  Principal Problem:   Acute on chronic HFrEF (heart failure with reduced ejection fraction) (HCC) Active Problems:   Chronic kidney disease (CKD), stage IV (severe) ([place])  85 year old man hospital day #3 with acute on chronic heart failure with reduced ejection fraction.  Patient was admitted with progressive dyspnea, NYHA class III-IV symptoms, pulmonary edema on chest x-ray, hypoxia, and mild pitting edema on his exam.  On my exam today he appears to be euvolemic, may be even a little dry.  He has no pitting edema in his legs, lungs still sound mild coarse crackles, no JVD.  I think the heart failure exacerbation has stabilized, unfortunately he is intolerant to most evidence-based medications due to renal insufficiency and a history of bradycardia with beta-blockers.  He has a mild acute on chronic kidney disease with creatinine at 4.5 today.  Looks like he had a difficult hospitalization in 2018 with severe renal failure due to hydronephrosis requiring stent placement.  Renal function never quite recovered back to normal after that event.  He has had low urine output here in the hospital, which could be due to incontinence leading to under-reporting.  Working to see how he does with oral hydration today.  Renal ultrasounds looks stable with no recurrent hydronephrosis.  Greatly appreciate nephrology input.  Overall functional status seems to be quite low for this older gentleman who reportedly lives by himself.  PT reports that he requires significant assistance right now due to weakness and likely deconditioning.  Continues to have hypoxia likely related to heart failure.  Were going to talk with his son to see if he can have 24-hour supervision at home, or would they prefer referral to subacute  rehabilitation.  If time allows I would also like to do some cognitive testing there in the hospital to evaluate for dementia.  Lalla Brothers, MD FACP

## 2018-12-26 NOTE — Progress Notes (Addendum)
   Subjective: Overnight, RN placed a condom catheter for incontinence. Repeat bladder scan showed 182 ml. Mr. Jakubiak was seen this morning and reported feeling well. Denies any pain or difficulty breathing. He states he does not think he has been urinating a lot. He endorses feeling thirsty. States he does not have much of an appetite. Denies nausea, vomiting or abdominal pain.   Objective:  Vital signs in last 24 hours: Vitals:   12/25/18 1009 12/25/18 1116 12/25/18 1938 12/26/18 0507  BP: 127/63 (!) 121/54 (!) 116/50 (!) 109/41  Pulse: (!) 53 (!) 43 (!) 45 (!) 47  Resp:  18 20 20   Temp:  98 F (36.7 C) 98.6 F (37 C) 97.8 F (36.6 C)  TempSrc:  Oral    SpO2: 96% 98% 95% 92%  Weight:    84.4 kg  Height:       Gen: seen comfortably sitting in the chair, no distress CV: RRR, no murmurs Abdomen: soft, non-distended Ext: no edema, warm to touch Skin: warm and dry   POCT Bladder Ultrasound: No bladder distension noted, minimal urine appreciated   Assessment/Plan:  Active Problems:   Acute HFrEF (heart failure with reduced ejection fraction) ([place])  Mr. Fleet is a 85 year old male with a history of CHG (EF 35%), who presented withworseningshortness of breathfor a few weeks.He was recently seen in clinic and was started on oxygen, does not always use his oxygen at home. He also has not been sticking to his sodium restriction.He was found to be hypoxic down to 76% by EMS and place onNC 1.5L.  ? Oliguric renal failure CKD stage 4: - Initially admitted as a CHF exacerbation, however he had no response to IV furosemide. He had one episode of incontinence overnight. Holding further diuresis at this point. Repeat bladder scan without retention. His Cr was near baseline on admission, increased to 4.56 today and BUN 49. He had one reported episode of incontinence last night. Output has been minimal with a condom catheter, ~400 ml - Renal US shows medical renal disease of  both kidneys, a complicated cyst at inferior pole of left kidney, and no evidence of hydronephrosis or solid mass lesions. - Daily BMP  - Consulted nephrology, appreciate recommendations - Foley catheter   Acute hypoxic respiratory failure HFrEF - Saturating at 92% on 2Lsupplemental oxygen  - Weight 186 - Improve EF 40-45%, from 35% two years ago - CXR shows pulmonary edema  - He did not respond well to 120 mg IV furosemide and appears dry on exam today. Will discontinue any further diuresis. Question of cardiorenal syndrome vs worsening CKD -Strict I+Os -Daily weights -Trend bmp -Telemetry  Bradycardia - HR continue to remain in the 40's, asymptomatic  - Monitor electrolytes - Continue telemetry   Hyperkalemia: - K 5.3 - Recheck BMP in AM  Hypertension: -109/41 - Hold isosorbide and hydralazine given his softer pressures  - Continue Cardura   Dispo: Anticipated discharge pending clinical improvement.     Mike Craze, DO 12/26/2018, 11:33 AM Pager: [phone]

## 2018-12-27 ENCOUNTER — Encounter (HOSPITAL_COMMUNITY): Payer: Self-pay | Admitting: Interventional Radiology

## 2018-12-27 ENCOUNTER — Inpatient Hospital Stay (HOSPITAL_COMMUNITY): Payer: Medicare Other

## 2018-12-27 DIAGNOSIS — N170 Acute kidney failure with tubular necrosis: Secondary | ICD-10-CM

## 2018-12-27 DIAGNOSIS — N19 Unspecified kidney failure: Secondary | ICD-10-CM | POA: Diagnosis present

## 2018-12-27 DIAGNOSIS — R001 Bradycardia, unspecified: Secondary | ICD-10-CM | POA: Diagnosis present

## 2018-12-27 HISTORY — PX: IR FLUORO GUIDE CV LINE RIGHT: IMG2283

## 2018-12-27 HISTORY — PX: IR US GUIDE VASC ACCESS RIGHT: IMG2390

## 2018-12-27 LAB — RENAL FUNCTION PANEL
Albumin: 3.2 g/dL — ABNORMAL LOW (ref 3.5–5.0)
Anion gap: 8 (ref 5–15)
BUN: 57 mg/dL — ABNORMAL HIGH (ref 8–23)
CO2: 24 mmol/L (ref 22–32)
Calcium: 8.3 mg/dL — ABNORMAL LOW (ref 8.9–10.3)
Chloride: 108 mmol/L (ref 98–111)
Creatinine, Ser: 5.57 mg/dL — ABNORMAL HIGH (ref 0.61–1.24)
GFR calc Af Amer: 10 mL/min — ABNORMAL LOW (ref >60)
GFR calc non Af Amer: 8 mL/min — ABNORMAL LOW (ref >60)
Glucose, Bld: 98 mg/dL (ref 70–99)
POTASSIUM: 5.7 mmol/L — AB (ref 3.5–5.1)
Phosphorus: 8.2 mg/dL — ABNORMAL HIGH (ref 2.5–4.6)
Sodium: 140 mmol/L (ref 135–145)

## 2018-12-27 LAB — TSH: TSH: 3.451 u[IU]/mL (ref 0.350–4.500)

## 2018-12-27 LAB — PROTIME-INR
INR: 1 (ref 0.8–1.2)
Prothrombin Time: 13.5 seconds (ref 11.4–15.2)

## 2018-12-27 MED ORDER — CEFAZOLIN SODIUM-DEXTROSE 2-4 GM/100ML-% IV SOLN
INTRAVENOUS | Status: AC
  Filled 2018-12-27: qty 100

## 2018-12-27 MED ORDER — CEFAZOLIN SODIUM-DEXTROSE 2-4 GM/100ML-% IV SOLN
2.0000 g | INTRAVENOUS | Status: AC
  Administered 2018-12-27: 2 g via INTRAVENOUS

## 2018-12-27 MED ORDER — CALCIUM GLUCONATE-NACL 1-0.675 GM/50ML-% IV SOLN
1.0000 g | Freq: Once | INTRAVENOUS | Status: AC
  Administered 2018-12-27: 1000 mg via INTRAVENOUS
  Filled 2018-12-27: qty 50

## 2018-12-27 MED ORDER — LIDOCAINE-EPINEPHRINE (PF) 1 %-1:200000 IJ SOLN
INTRAMUSCULAR | Status: AC
  Filled 2018-12-27: qty 30

## 2018-12-27 MED ORDER — CHLORHEXIDINE GLUCONATE CLOTH 2 % EX PADS
6.0000 | MEDICATED_PAD | Freq: Every day | CUTANEOUS | Status: DC
  Administered 2018-12-27: 6 via TOPICAL

## 2018-12-27 MED ORDER — LIDOCAINE HCL 1 % IJ SOLN
INTRAMUSCULAR | Status: AC
  Filled 2018-12-27: qty 20

## 2018-12-27 MED ORDER — HEPARIN SODIUM (PORCINE) 1000 UNIT/ML IJ SOLN
INTRAMUSCULAR | Status: AC
  Filled 2018-12-27: qty 1

## 2018-12-27 MED ORDER — SODIUM ZIRCONIUM CYCLOSILICATE 10 G PO PACK
10.0000 g | PACK | Freq: Once | ORAL | Status: AC
  Administered 2018-12-27: 10 g via ORAL
  Filled 2018-12-27: qty 1

## 2018-12-27 MED ORDER — LIDOCAINE HCL 1 % IJ SOLN
INTRAMUSCULAR | Status: DC | PRN
  Administered 2018-12-27: 10 mL

## 2018-12-27 MED ORDER — FERRIC CITRATE 1 GM 210 MG(FE) PO TABS
420.0000 mg | ORAL_TABLET | Freq: Three times a day (TID) | ORAL | Status: DC
  Filled 2018-12-27 (×5): qty 2

## 2018-12-27 MED ORDER — GELATIN ABSORBABLE 12-7 MM EX MISC
CUTANEOUS | Status: AC
  Filled 2018-12-27: qty 1

## 2018-12-27 NOTE — Progress Notes (Signed)
Patient being transferred to [facility] room 03. Patient report given to RN Venora Maples.  Patient's son Lynnette Caffey and spouse notified of patient transfer to [address]. Marcille Blanco, RN

## 2018-12-27 NOTE — Sedation Documentation (Signed)
No sedation per MD

## 2018-12-27 NOTE — Progress Notes (Signed)
PT Cancellation Note  Patient Details Name: [name] [name] MRN: [id number] DOB: [birth year]   Cancelled Treatment:    Reason Eval/Treat Not Completed: Medical issues which prohibited therapy. Per RN, pt with bradycardia (as low as 20s); preparing for transfer to 6E due to worsening medical status. Will follow-up for PT treatment as appropriate.  Mabeline Caras, PT, DPT Acute Rehabilitation Services  Pager [phone] Office [place] 12/27/2018, 10:17 AM

## 2018-12-27 NOTE — Progress Notes (Signed)
Pt HR dropped to 21. MD notified. MD to review pt chart. No new orders given.

## 2018-12-27 NOTE — Progress Notes (Signed)
At the beginning of this shift patient had 30 second run of wide QRS.  HR stayed below 100.  Heart rate has primarily been between 30-40 this shift has dropped as low as 25-27 nonsustained.  Patient appears to be asymptomatic and when RN asked patient how he is feeling he is easily arouseable and replies "pretty good".  Patient is alert to self and intermittently has been alert to place, disoriented to situation and time.  RN text paged Internal Medicine Residency with this information.  Will continue to monitor.

## 2018-12-27 NOTE — Progress Notes (Signed)
CCMD notified pt had 3.99 sec pause. Notified MD. No new orders given.

## 2018-12-27 NOTE — Progress Notes (Addendum)
[facility] NEPHROLOGY PROGRESS NOTE  Assessment/ Plan: Pt is a 85 y.o. yo male with history of CHF, hypertension, hyperlipidemia, prostate cancer, CKD stage IV with baseline creatinine around 2.5-4 admitted with CHF exacerbation now consulted for worsening renal failure and oliguric AKI.  # CKD stageIV/V, anuric with some uremic features and hyperkalemia:  due to hypertension and CHF and may have some ischemic ATN due to relative hypotension.  The urinalysis is unremarkable and ultrasound kidneys with bilateral cortical thinning and complicated cyst in left kidney.  No evidence of hydronephrosis.  Chest x-ray with pulmonary edema and no improvement on the IV fluid. -He is now anuric, hyperkalemia with worsening renal failure and confusion.  I have discussed with the patient and discussed with patient's son Lynnette Caffey) over the phone regarding worsening kidney function and plan for initiating dialysis.  Patient son wants to do every treatment measures including dialysis.  Plan for tunneled catheter today and then first treatment.  Discussed with IR.  Ordered vein mapping as well. -Avoid IV contrast, NSAIDs.  #Mild hyperkalemia: Lokelma and dialysis later today.  #Anemia: Check iron studies.  Hemoglobin is stable.  #CHF exacerbation: Echo reviewed.  #Hypertension: Blood pressure on the borderline low therefore hold antihypertensive medication.  #Secondary hyperparathyroidism: Currently on calcitriol.    Phosphorus level elevated therefore start Auryxia.  Check PTH level.  Subjective: Seen and examined at bedside.  Patient is alert awake but sluggish.  No urine output with IV fluid.  No shortness of breath or chest pain. Objective Vital signs in last 24 hours: Vitals:   12/26/18 2013 12/26/18 2015 12/26/18 2208 12/27/18 0641  BP: (!) 135/55   (!) 109/36  Pulse: (!) 47 71  (!) 44  Resp: (!) 27  20   Temp:      TempSrc:      SpO2: 95%   94%  Weight:      Height:        Weight change:   Intake/Output Summary (Last 24 hours) at 12/27/2018 1034 Last data filed at 12/27/2018 0400 Gross per 24 hour  Intake 1524.67 ml  Output 0 ml  Net 1524.67 ml       Labs: Basic Metabolic Panel: Recent Labs  Lab 12/25/18 0404 12/26/18 0422 12/27/18 0405  NA 143 140 140  K 4.8 5.3* 5.7*  CL 108 106 108  CO2 29 27 24   GLUCOSE 103* 127* 98  BUN 42* 49* 57*  CREATININE 3.57* 4.56* 5.57*  CALCIUM 8.6* 8.4* 8.3*  PHOS  --   --  8.2*   Liver Function Tests: Recent Labs  Lab 12/02/2018 1544 12/27/18 0405  AST 27  --   ALT 14  --   ALKPHOS 47  --   BILITOT 1.0  --   PROT 7.0  --   ALBUMIN 3.5 3.2*   No results for input(s): LIPASE, AMYLASE in the last 168 hours. No results for input(s): AMMONIA in the last 168 hours. CBC: Recent Labs  Lab 12/22/2018 1544 12/25/18 0404  WBC 2.9* 4.0  HGB 11.7* 11.2*  HCT 39.1 37.9*  MCV 101.8* 103.8*  PLT 156 148*   Cardiac Enzymes: No results for input(s): CKTOTAL, CKMB, CKMBINDEX, TROPONINI in the last 168 hours. CBG: No results for input(s): GLUCAP in the last 168 hours.  Iron Studies: No results for input(s): IRON, TIBC, TRANSFERRIN, FERRITIN in the last 72 hours. Studies/Results: US Renal  Result Date: 12/26/2018 CLINICAL DATA:  Renal failure EXAM: RENAL / URINARY TRACT ULTRASOUND COMPLETE  COMPARISON:  CT abdomen and pelvis 08/22/2017 FINDINGS: Right Kidney: Renal measurements: 9.4 x 4.4 x 4.9 cm = volume: 104 mL. Normal cortical thickness. Increased cortical echogenicity. Tiny cyst mid kidney 8 x 6 x 7 mm. No additional mass, hydronephrosis or shadowing calcification. Left Kidney: Renal measurements: 9.2 x 5.1 x 5.2 cm = volume: 126 mL. Normal cortical thickness. Increased cortical echogenicity. Simple cyst at upper pole LEFT kidney 3.3 x 2.9 x 3.8 cm. Additional complicated cyst with lobulated contours at inferior pole 2.9 x 2.4 x 3.7 cm. No additional mass, hydronephrosis or shadowing calcification. Trace  perinephric fluid. Bladder: Appears normal for degree of bladder distention. Small LEFT pleural effusion noted. IMPRESSION: Medical renal disease changes of both kidneys. Complicated cyst at inferior pole of LEFT kidney 2.9 x 2.4 x 3.7 cm, measured 3.7 x 3.2 x 4.2 cm in 2018. No evidence of hydronephrosis or solid mass lesions. Electronically Signed   By: Lavonia Dana M.D.   On: 12/26/2018 10:20   Dg Chest Port 1 View  Result Date: 12/26/2018 CLINICAL DATA:  SOB (shortness of breath) EXAM: PORTABLE CHEST - 1 VIEW COMPARISON:  12/04/2018 FINDINGS: Some increase in bilateral pleural effusions. Perihilar interstitial edema or infiltrates as before. Left retrocardiac consolidation/atelectasis stable. Stable cardiomegaly. No pneumothorax. Visualized bones unremarkable. IMPRESSION: Stable cardiomegaly and bilateral edema/infiltrates with slight increase in pleural effusions. Electronically Signed   By: Lucrezia Europe M.D.   On: 12/26/2018 15:57    Medications: Infusions: . calcium gluconate 1,000 mg (12/27/18 1018)    Scheduled Medications: . aspirin EC  81 mg Oral Daily  . brimonidine  1 drop Both Eyes BID  . calcitRIOL  0.25 mcg Oral Daily  . Chlorhexidine Gluconate Cloth  6 each Topical Q0600  . ferrous sulfate  325 mg Oral QPM  . latanoprost  1 drop Both Eyes QHS  . simvastatin  10 mg Oral q1800  . sodium zirconium cyclosilicate  10 g Oral Once  . timolol  1 drop Both Eyes Daily    have reviewed scheduled and prn medications.  Physical Exam: General:NAD, comfortable Heart:RRR, s1s2 nl no rubs Lungs:clear b/l, no crackle Abdomen:soft, Non-tender, non-distended Extremities:No edema Dialysis Access: None  Dron Prasad Bhandari 12/27/2018,10:34 AM  LOS: 2 days

## 2018-12-27 NOTE — Progress Notes (Signed)
Pt HR dropped to 21. Also pt had no urine output overnight. Bladder scan shows 0 ml. Day shift MD paged. Stated they will round on patient soon.

## 2018-12-27 NOTE — Procedures (Signed)
ESRD  S/p RT IJ TUNNELED HD CATH  Tip svcra No comp Stable ebl min Ready for use Full report in pacs

## 2018-12-27 NOTE — Consult Note (Signed)
Chief Complaint: Patient was seen in consultation today for tunneled dialysis catheter placement Chief Complaint  Patient presents with  . Shortness of Breath   at the request of Dr Carolin Sicks  Supervising Physician: Daryll Brod  Patient Status: [facility] - In-pt  History of Present Illness: [name] [name] is a 85 y.o. male   CHF; HTN; Prostate ca; CKD 4 Admitted with CHF exacerbation Worsening Cr level Anuric; hyperkalemic; confusion  DR Carolin Sicks will initiate dialysis asap Requesting tunneled dialysis catheter placement   Past Medical History:  Diagnosis Date  . CHF (congestive heart failure) ([place])   . Heart murmur   . History of gout ~ 1998  . Hyperlipidemia   . Hypertension   . Prostate cancer ([place]) ~ 1998   S/P radiation    Past Surgical History:  Procedure Laterality Date  . CATARACT EXTRACTION W/ INTRAOCULAR LENS  IMPLANT, BILATERAL Bilateral   . CYSTOSCOPY W/ URETERAL STENT PLACEMENT Left 08/21/2017   Procedure: CYSTOSCOPY WITH RETROGRADE PYELOGRAM/URETERAL JJ STENT PLACEMENT;  Surgeon: Ceasar Mons, MD;  Location: [place];  Service: Urology;  Laterality: Left;    Allergies: Patient has no known allergies.  Medications: Prior to Admission medications   Medication Sig Start Date End Date Taking? Authorizing Provider  amLODipine (NORVASC) 10 MG tablet Take 1 tablet (10 mg total) by mouth daily. 08/01/18  Yes Kathi Ludwig, MD  aspirin 81 MG EC tablet TAKE 1 TABLET BY MOUTH EVERY DAY Patient taking differently: Take 81 mg by mouth daily.  09/16/18  Yes Mosetta Anis, MD  brimonidine (ALPHAGAN) 0.2 % ophthalmic solution Place 1 drop into both eyes 2 (two) times daily. 07/07/17  Yes [provider]  calcitRIOL (ROCALTROL) 0.25 MCG capsule Take 1 capsule (0.25 mcg total) by mouth daily. 09/29/18  Yes Mosetta Anis, MD  doxazosin (CARDURA) 4 MG tablet Take 2 mg by mouth daily. 12/11/2018  Yes [provider]  Ferrous Sulfate  (IRON PO) Take 1 tablet by mouth every evening.   Yes [provider]  hydrALAZINE (APRESOLINE) 100 MG tablet TAKE 1 TABLET BY MOUTH 3 TIMES DAILY Patient taking differently: Take 100 mg by mouth 3 (three) times daily.  10/18/18  Yes Bensimhon, Shaune Pascal, MD  isosorbide dinitrate (ISORDIL) 20 MG tablet TAKE 2 TABLETS BY MOUTH 3 TIMES DAILY Patient taking differently: Take 40 mg by mouth 3 (three) times daily.  10/20/18  Yes Clegg, Amy D, NP  latanoprost (XALATAN) 0.005 % ophthalmic solution Place 1 drop into both eyes at bedtime.    Yes [provider]  OXYGEN Inhale 1.5 L into the lungs as needed (shortness of breath).   Yes [provider]  simvastatin (ZOCOR) 10 MG tablet TAKE 1 TABLET (10 MG TOTAL) BY MOUTH DAILY AT 6 PM. Patient taking differently: Take 10 mg by mouth daily at 6 PM.  08/17/18  Yes Mosetta Anis, MD  timolol (TIMOPTIC) 0.5 % ophthalmic solution Place 1 drop into both eyes daily. 06/02/17  Yes [provider]  doxazosin (CARDURA) 2 MG tablet Take 1 tablet (2 mg total) by mouth daily. Patient not taking: Reported on 12/18/2018 06/29/18   Bensimhon, Shaune Pascal, MD     Family History  Problem Relation Age of Onset  . Hypertension Father   . Hypertension Sister   . Hypertension Brother     Social History   Socioeconomic History  . Marital status: Married    Spouse name: Not on file  . Number of children:  Not on file  . Years of education: Not on file  . Highest education level: Not on file  Occupational History  . Not on file  Social Needs  . Financial resource strain: Not on file  . Food insecurity:    Worry: Not on file    Inability: Not on file  . Transportation needs:    Medical: Not on file    Non-medical: Not on file  Tobacco Use  . Smoking status: Former Smoker    Packs/day: 1.00    Years: 46.00    Pack years: 46.00    Types: Cigarettes    Last attempt to quit: 1998    Years since quitting: 22.1  . Smokeless tobacco:  Never Used  Substance and Sexual Activity  . Alcohol use: No    Alcohol/week: 0.0 standard drinks  . Drug use: No  . Sexual activity: Not on file  Lifestyle  . Physical activity:    Days per week: Not on file    Minutes per session: Not on file  . Stress: Not on file  Relationships  . Social connections:    Talks on phone: Not on file    Gets together: Not on file    Attends religious service: Not on file    Active member of club or organization: Not on file    Attends meetings of clubs or organizations: Not on file    Relationship status: Not on file  Other Topics Concern  . Not on file  Social History Narrative  . Not on file    Review of Systems: A 12 point ROS discussed and pertinent positives are indicated in the HPI above.  All other systems are negative.  Review of Systems  Neurological: Positive for weakness.  Psychiatric/Behavioral: Positive for confusion and decreased concentration.    Vital Signs: BP (!) 109/36 (BP Location: Right Arm)   Pulse (!) 44   Temp 97.9 F (36.6 C) (Axillary)   Resp 20   Ht 6\' 2"  (1.88 m)   Wt 186 lb 1.6 oz (84.4 kg) Comment: scale c  SpO2 94%   BMI 23.89 kg/m   Physical Exam Vitals signs reviewed.  Pulmonary:     Breath sounds: Examination of the right-upper field reveals decreased breath sounds. Examination of the left-upper field reveals decreased breath sounds. Examination of the right-middle field reveals decreased breath sounds. Examination of the left-middle field reveals decreased breath sounds. Examination of the right-lower field reveals decreased breath sounds. Examination of the left-lower field reveals decreased breath sounds. Decreased breath sounds present.  Abdominal:     Palpations: Abdomen is soft.  Musculoskeletal:     Comments: No response  Skin:    General: Skin is warm and dry.  Psychiatric:     Comments: Consented with son Morris via phone     Imaging: Dg Chest 2 View  Result Date:  11/26/2018 CLINICAL DATA:  Chest pain and shortness of breath for 2 weeks. EXAM: CHEST - 2 VIEW COMPARISON:  11/04/2017 FINDINGS: Increase in cardiac enlargement. Bilateral pleural effusions are identified which are also increased in the interval. Diffuse bilateral hazy lung opacities compatible with moderate pulmonary edema. IMPRESSION: 1. Moderate CHF pattern. Electronically Signed   By: Kerby Moors M.D.   On: 12/06/2018 16:57   US Renal  Result Date: 12/26/2018 CLINICAL DATA:  Renal failure EXAM: RENAL / URINARY TRACT ULTRASOUND COMPLETE COMPARISON:  CT abdomen and pelvis 08/22/2017 FINDINGS: Right Kidney: Renal measurements: 9.4 x 4.4 x 4.9 cm =  volume: 104 mL. Normal cortical thickness. Increased cortical echogenicity. Tiny cyst mid kidney 8 x 6 x 7 mm. No additional mass, hydronephrosis or shadowing calcification. Left Kidney: Renal measurements: 9.2 x 5.1 x 5.2 cm = volume: 126 mL. Normal cortical thickness. Increased cortical echogenicity. Simple cyst at upper pole LEFT kidney 3.3 x 2.9 x 3.8 cm. Additional complicated cyst with lobulated contours at inferior pole 2.9 x 2.4 x 3.7 cm. No additional mass, hydronephrosis or shadowing calcification. Trace perinephric fluid. Bladder: Appears normal for degree of bladder distention. Small LEFT pleural effusion noted. IMPRESSION: Medical renal disease changes of both kidneys. Complicated cyst at inferior pole of LEFT kidney 2.9 x 2.4 x 3.7 cm, measured 3.7 x 3.2 x 4.2 cm in 2018. No evidence of hydronephrosis or solid mass lesions. Electronically Signed   By: Lavonia Dana M.D.   On: 12/26/2018 10:20   Dg Chest Port 1 View  Result Date: 12/26/2018 CLINICAL DATA:  SOB (shortness of breath) EXAM: PORTABLE CHEST - 1 VIEW COMPARISON:  12/04/2018 FINDINGS: Some increase in bilateral pleural effusions. Perihilar interstitial edema or infiltrates as before. Left retrocardiac consolidation/atelectasis stable. Stable cardiomegaly. No pneumothorax. Visualized bones  unremarkable. IMPRESSION: Stable cardiomegaly and bilateral edema/infiltrates with slight increase in pleural effusions. Electronically Signed   By: Lucrezia Europe M.D.   On: 12/26/2018 15:57    Labs:  CBC: Recent Labs    11/30/2018 1544 12/25/18 0404  WBC 2.9* 4.0  HGB 11.7* 11.2*  HCT 39.1 37.9*  PLT 156 148*    COAGS: No results for input(s): INR, APTT in the last 8760 hours.  BMP: Recent Labs    12/18/2018 1544 12/25/18 0404 12/26/18 0422 12/27/18 0405  NA 141 143 140 140  K 5.3* 4.8 5.3* 5.7*  CL 108 108 106 108  CO2 27 29 27 24   GLUCOSE 109* 103* 127* 98  BUN 42* 42* 49* 57*  CALCIUM 8.6* 8.6* 8.4* 8.3*  CREATININE 3.27* 3.57* 4.56* 5.57*  GFRNONAA 16* 14* 11* 8*  GFRAA 19* 17* 12* 10*    LIVER FUNCTION TESTS: Recent Labs    11/28/2018 1544 12/27/18 0405  BILITOT 1.0  --   AST 27  --   ALT 14  --   ALKPHOS 47  --   PROT 7.0  --   ALBUMIN 3.5 3.2*    TUMOR MARKERS: No results for input(s): AFPTM, CEA, CA199, CHROMGRNA in the last 8760 hours.  Assessment and Plan:  A/CRF Worsening Cr level Anuric; confusion Scheduled to initiate dialysis asap Scheduled for tunneled dialysis catheter placement Risks and benefits discussed with the patient's son Morris via phone including, but not limited to bleeding, infection, vascular injury, pneumothorax which may require chest tube placement, air embolism or even death  All of the sons questions were answered, he is agreeable to proceed. Consent signed and in chart.  Thank you for this interesting consult.  I greatly enjoyed meeting TAYSHUN GAPPA and look forward to participating in their care.  A copy of this report was sent to the requesting provider on this date.  Electronically Signed: Lavonia Drafts, PA-C 12/27/2018, 10:41 AM   I spent a total of 20 Minutes    in face to face in clinical consultation, greater than 50% of which was counseling/coordinating care for tunneled dialysis catheter placement

## 2018-12-27 NOTE — Progress Notes (Signed)
   Subjective: Overnight, patient was noted to have significant bradycardia, as low as 20. He was also found to have a couple pauses, longest of 8 seconds. He remained asymptomatic and slept through these episodes.  He was seen comfortably laying in bed this morning. He reports feeling fine this morning. Denies any chest pain or trouble breathing. No acute complaints. He was very somnolent on exam.   Objective:  Vital signs in last 24 hours: Vitals:   12/26/18 2013 12/26/18 2015 12/26/18 2208 12/27/18 0641  BP: (!) 135/55   (!) 109/36  Pulse: (!) 47 71  (!) 44  Resp: (!) 27  20   Temp:      TempSrc:      SpO2: 95%   94%  Weight:      Height:       Gen: somnolent on exam, comfortably laying in bed, no distress CV: bradycardic, distant heart sounds but no murmurs appreciated Ext: no edema, warm  Abdomen: soft, non-distended, no TTP Skin: dry and warm  Assessment/Plan:  Principal Problem:   Acute on chronic HFrEF (heart failure with reduced ejection fraction) (HCC) Active Problems:   Chronic kidney disease (CKD), stage IV (severe) (HCC)  Mr. Arriaga a 85 year old male with a history of CHG (EF 35%), who presented withworseningshortness of breathfor a few weeks.He was recently seen in clinic and was started on oxygen, does not always use his oxygen at home. He also has not been sticking to his sodium restriction.He was found to be hypoxic down to 76% by EMS and place onNC 1.5L.  Anuric with uremia CKD stage 4 Hyperkalemia - Patient has had no urine output and bladder scan showed 0 ml - He appeared more uremic on exam today, bradycardic with soft pressures, hyperkalemic at 5.7, and now anuric most likely needs urgent dialysis. Discussed with patient's son, Mr. Pellegrino Kennard, who wants aggressive measures and to proceed with dialysis.  - Spoke with Dr. Carolin Sicks and plan is to proceed with a tunneled HD catheter and dialysis  - Lokelma and dialysis for  hyperkalemia  - Consulted IR for tunnled HD catheter, appreciate recommendations  - Consulted nephrology, appreciate recommendations  Bradycardia - Overnight HR dropped to as low as 20 and continues to remain in the 40's, asymptomatic  - Repeat EKG today showed some prolonged QRS' and sinus bradycardia  - Most likely in the setting of worsening renal function - Monitor electrolytes - Continue telemetry   Acute hypoxic respiratory failure HFrEF - Saturating at 94% on 2Lsupplemental oxygen  - Repeat CXR yesterday showed slight increase in bilateral pleural effusions  - Strict I+Os - Daily weights - Trend bmp - Telemetry  Hypertension: -109/36 - Holding cardura, isosorbide and hydralazine given his softer pressures   Dispo: Anticipated discharge pending clinical improvement   Mike Craze, DO 12/27/2018, 6:57 AM Pager: [phone]

## 2018-12-27 NOTE — Progress Notes (Signed)
Received patient from [place] - pt lethargic with slightly garbled speech but arousable. HR dipping into upper 30s, BP low 100s - spoke with MD regarding plan for HR. Stated they are correlating bradycardia with current renal issues, plan is for HD access and HD today which will hopefully improve HR. Stated mental status is the same as earlier this am. MD to speak with IR regarding HR and whether it will be an issue for catheter placement. Primary RN, Venora Maples, updated and made aware. Will continue to monitor patient.

## 2018-12-27 NOTE — Progress Notes (Signed)
Pt had 8 sec pause followed by 2 sec pause. Provider text paged.

## 2018-12-27 NOTE — Progress Notes (Signed)
Internal Medicine Teaching Service Attending:   I saw and examined the patient. I reviewed the resident's note and I agree with the resident's findings and plan as documented in the resident's note.  Principal Problem:   Acute on chronic renal failure (HCC) Active Problems:   Acute on chronic HFrEF (heart failure with reduced ejection fraction) (HCC)   Uremia   Sinus bradycardia  85 year old person hospital day #3 with worsening clinical status today. He has anuric acute on chronic renal failure, very small urine output through foley. This is complicated by worsening uremia which is likely the cause of his worsening mental status this morning. He has hyperkalemia, only ECG change is a mild lengthening of QRS. I greatly appreciate consultation of Dr. Carolin Sicks and I agree that the next step in full management would be to start HD today. ATN due to hypotension seems like the most likely cause of this worsening renal function.   He also has recurrent sinus bradycardia. I reviewed the overnight telemetry, no heart block seen. There were several sinus pauses. Likely this is related to his overall illness, uremia, and maybe the hyperkalemia. I hope this will improve with HD as we correct his metabolic derangements. We gave a dose of calcium gluconate in the meantime to stabilize from arrhythmia.   HFrEF continues to be difficult. His cardiac function is stable, he is warm with mild edema in his legs, lungs have course crackles with bilateral small pleural effusions. Overall he appears euvolemic, maybe a little volume up after receiving some fluid yesterday. Would stop IV fluid and we will see how his volume shifts do on HD.   Lalla Brothers, MD FACP

## 2018-12-28 ENCOUNTER — Inpatient Hospital Stay (HOSPITAL_COMMUNITY): Payer: Medicare Other

## 2018-12-28 DIAGNOSIS — Z66 Do not resuscitate: Secondary | ICD-10-CM

## 2018-12-28 DIAGNOSIS — C61 Malignant neoplasm of prostate: Secondary | ICD-10-CM

## 2018-12-28 DIAGNOSIS — I959 Hypotension, unspecified: Secondary | ICD-10-CM

## 2018-12-28 LAB — RENAL FUNCTION PANEL
Albumin: 3.1 g/dL — ABNORMAL LOW (ref 3.5–5.0)
Anion gap: 9 (ref 5–15)
BUN: 74 mg/dL — ABNORMAL HIGH (ref 8–23)
CO2: 26 mmol/L (ref 22–32)
Calcium: 8.3 mg/dL — ABNORMAL LOW (ref 8.9–10.3)
Chloride: 105 mmol/L (ref 98–111)
Creatinine, Ser: 6.58 mg/dL — ABNORMAL HIGH (ref 0.61–1.24)
GFR calc non Af Amer: 7 mL/min — ABNORMAL LOW (ref >60)
GFR, EST AFRICAN AMERICAN: 8 mL/min — AB (ref >60)
Glucose, Bld: 92 mg/dL (ref 70–99)
POTASSIUM: 5.2 mmol/L — AB (ref 3.5–5.1)
Phosphorus: 9.1 mg/dL — ABNORMAL HIGH (ref 2.5–4.6)
Sodium: 140 mmol/L (ref 135–145)

## 2018-12-28 LAB — CBC
HEMATOCRIT: 39 % (ref 39.0–52.0)
Hemoglobin: 11.4 g/dL — ABNORMAL LOW (ref 13.0–17.0)
MCH: 31.6 pg (ref 26.0–34.0)
MCHC: 29.2 g/dL — ABNORMAL LOW (ref 30.0–36.0)
MCV: 108 fL — ABNORMAL HIGH (ref 80.0–100.0)
Platelets: 91 10*3/uL — ABNORMAL LOW (ref 150–400)
RBC: 3.61 MIL/uL — ABNORMAL LOW (ref 4.22–5.81)
RDW: 12.6 % (ref 11.5–15.5)
WBC: 6 10*3/uL (ref 4.0–10.5)
nRBC: 0 % (ref 0.0–0.2)

## 2018-12-28 LAB — IRON AND TIBC
Iron: 44 ug/dL — ABNORMAL LOW (ref 45–182)
SATURATION RATIOS: 18 % (ref 17.9–39.5)
TIBC: 246 ug/dL — AB (ref 250–450)
UIBC: 202 ug/dL

## 2018-12-28 LAB — FERRITIN: Ferritin: 101 ng/mL (ref 24–336)

## 2018-12-29 LAB — PTH, INTACT AND CALCIUM
Calcium, Total (PTH): 8.2 mg/dL — ABNORMAL LOW (ref 8.6–10.2)
PTH: 83 pg/mL — ABNORMAL HIGH (ref 15–65)

## 2019-01-25 NOTE — Progress Notes (Signed)
Patient has removed nasal cannula and bair hugger multiple times.  Son, [name] at bedside, Retail banker and nasal cannula left off per [employer] request due to wanting to keep patient as comfortable as possible.  Internal Medicine Residency paged with this information.

## 2019-01-25 NOTE — Progress Notes (Signed)
Patient placed on bair hugger about 0200.  Primary RN spoke with hemodialysis RN and per hemodialysis RN felt patient too unstable to initiate hemodialysis.  Patient's son [name] currently at bedside and was updated.  RN text paged Dr. Tarri Abernethy with this information via [place].

## 2019-01-25 NOTE — Progress Notes (Signed)
Patient had a 12.04 second pause.  RN into check on patient and patient had removed nasal cannula and had uncovered arms from bair hugger and blanket.  Nasal cannula reapplied and arms recovered under bair hugger and blanket.  Internal Medicine Residency paged with this information.

## 2019-01-25 NOTE — Progress Notes (Signed)
Patient's son [name] returned call and RN updated [name] on patient's decline in status.

## 2019-01-25 NOTE — Death Summary Note (Addendum)
  Name: [name] [name] MRN: [id number] DOB: Apr 29, 1930 85 y.o.  Date of Admission: 11/29/2018  3:24 PM Date of Discharge: 01/18/2019 Attending Physician: Axel Filler, *  Discharge Diagnosis:   Cause of death:  Acute on Chronic Renal Failure Acute on Chronic Heart Failure  Time of death:   Disposition and follow-up:   [name] was discharged from [facility] in expired condition.    Hospital Course: [name] was an 85 yo M with a past medical history of chronic combined systolic and diastolic heart failure, chronic kidney disease stage IV, and prostate cancer admitted on 2/292020 with progressive worsening shortness of breath and functional decline. He was found to be hypoxic with pulmonary edema and volume overloaded on exam. Diuresis was attempted, unfortunately patient became hypotensive. Renal function worsened and he developed anuric renal failure. Subsequently he did not respond to IV fluid challenge. On 12/27/2018, patient became progressively more uremic with altered mental status, developed hyperkalemia, and worsening of his chronic bradycardia. He was given temporizing measures for his hyperkalemia and IV calcium gluconate for his bradycardia. Family was updated. After discussion, son [name] (primary care giver) decided to continue with aggressive measures including dialysis, but given his chronic co morbidities and slow functional decline, made patient DNR. Arrangements were made for urgent dialysis. He had a temporary tunneled HD cath placed per IR that same day. Plan was for dialysis that evening. Unfortunately he rapidly declined with worsening encephalopathy, hypothermia, unstable bradycardia with intermittent long sinus pauses (12 seconds), and hypotension. He was deemed too unstable for intermittent dialysis per nephrology. Patient expired the following morning, 2019/01/18 at 7:45 am. Family updated.   Signed: Velna Ochs,  MD January 18, 2019, 7:54 AM

## 2019-01-25 NOTE — Progress Notes (Signed)
RN paged Internal Medicine Residency (PGY1) due to patient's rectal temperature of 92.2.  No response as of yet.  RN text paged PGY2 with service.  RN called [name] (patient's son), no answer and voicemail was full.  RN called Addie (patient's spouse) and Morris (patient's son), both family members answered and RN was able to update Addie and Morris about patient's decline in status.  Ina Homes, MD returned RN paged and was updated of all information in this note.

## 2019-01-25 NOTE — Progress Notes (Addendum)
Patient was evaluated at bedside for concerns of worsening bradycardia and decreased mental status. Baseline heart rate is usually in the 40-50s but has been sustaining in the 20-30s. Patient is somnolent but easily arousable, only oriented to self. He denies any symptoms. Per day team notes, he has become progressively more confused, most likely secondary to worsening uremia. He had temporary dialysis catheter placed on 3/3 and is supposed to be going for dialysis tonight.   HR 39, BP 112/42, O2 100% on 3L [place], RR 19, Rectal Temp 92.15F Gen: sleepy elderly gentleman, easily arousable and responsive Cardiac: bradycardic, regular rhythm, no m/r/g Ext: warm, no LEE  A/P: 89yoM with HRrEF (EF 35%) who initially presented with worsening sob and hypoxia. He was initially treated with IV diuretics but had poor urine output and worsening renal failure thought to be secondary to ATN from hypotension. He is now anuric with worsening uremia and hyperkalemia. He was given lokelma and calcium gluconate earlier to temporize his electrolyte disturbances until HD could be performed. Mental status is difficult to assess as I am not familiar with his baseline but supposedly, he was oriented to person, place, and time on admission. Bradycardia has been chronic, HR decreases to the 20-30s when asleep and increases to 40 bpm when aroused. I am most concerned about his mental status. We will order stat labs and try to get HD asap. However, I am concerned that he will not tolerate HD given his hypotension. Will try to get in touch with family and discuss prognosis.  - Repeat EKG: sinus bradycardia with prolonged PR interval and QRS - stat renal function panel  - Discussed code status with sons (Morris and Ethel) and ex-wife (Addie), they would like to change code status to DNR with full scope of care and continue plan for dialysis  - RN spoke with HD, they are hoping to dialyze him within the next hour - bear hugger   Francesco Runner, MD Internal Medicine, PGY1 Pager: [phone] 01/17/2019, 1:42 AM

## 2019-01-25 NOTE — Progress Notes (Addendum)
Presented to HD to discuss concerns with HD RN about patient stability. She currently has a patient in HD that is requiring full supervision and is concerned that with the patient's HR she would not be able to safely monitor both patients. States that she will not have additional support until around 6 AM and will attempt at that point.   Patient's son, Luvenia Redden, currently at bedside. We discussed the current plan. He voices understanding. All questions and concerns addressed.    Ina Homes, MD  Jan 17, 2019 [phone] AM

## 2019-01-25 NOTE — Progress Notes (Signed)
Report received at beginning at 0600 from Cherylann Banas, RN, the HD nightshift RN.  HD RN had patient in route to be transported to the HD unit and call received from [place], RN primary nightshift RN that patient's HR was now in the 30's at approxiamately 0200.  HD RN notified primary RN that patient was to unstable to be brought to the HD unit with another patient and she would not be able to dialyze the patient as a 1:1 until after completing the current treatment order per nephrology.  Theora Gianotti, charge RN for [place] nightshift presented to the HD unit this am approximately [phone] to inquire about the time patient would be dialyzed.  At this time I informed Altha Harm that at the last given report patient was unstable and heart rate remained in the 30's.  I told Altha Harm that I would contact Dr. Carolin Sicks for further instructions and get back with the assigned primary RN.  Upon speaking with Dr. Carolin Sicks at [phone] orders were received to continue to hold dialysis until further notice.  Orders followed through as given.

## 2019-01-25 NOTE — Progress Notes (Signed)
Patient heart rate sustaining more in the 30's with heart rate down into the low 20's.  RN into check on patient and asked patient how he is feeling and he states pretty good.  RN then asked patient if he felt tired and patient replied yes.  RN text paged Internal Medicine Residency requesting they come to bedside to see patient.

## 2019-01-25 NOTE — Progress Notes (Signed)
RN noted patient oxygen saturation in the 80's.  RN into check on patient and patient had removed nasal cannula and had uncovered himself from the bair hugger and blanket exposing his upper arms.  RN reapplied nasal cannula & recovered patient's arms with bair hugger & blankets.

## 2019-01-25 DEATH — deceased
